# Patient Record
Sex: Female | Born: 1965 | Race: White | Hispanic: No | Marital: Married | Smoking: Former smoker
Health system: Southern US, Community
[De-identification: ages and names within clinical notes are randomized; demographics above are authoritative.]

## PROBLEM LIST (undated history)

## (undated) DIAGNOSIS — J449 Chronic obstructive pulmonary disease, unspecified: Secondary | ICD-10-CM

## (undated) DIAGNOSIS — F419 Anxiety disorder, unspecified: Secondary | ICD-10-CM

## (undated) DIAGNOSIS — E785 Hyperlipidemia, unspecified: Secondary | ICD-10-CM

## (undated) DIAGNOSIS — R002 Palpitations: Secondary | ICD-10-CM

## (undated) DIAGNOSIS — J849 Interstitial pulmonary disease, unspecified: Secondary | ICD-10-CM

## (undated) DIAGNOSIS — R112 Nausea with vomiting, unspecified: Secondary | ICD-10-CM

## (undated) DIAGNOSIS — E039 Hypothyroidism, unspecified: Secondary | ICD-10-CM

## (undated) DIAGNOSIS — D219 Benign neoplasm of connective and other soft tissue, unspecified: Secondary | ICD-10-CM

## (undated) DIAGNOSIS — K5900 Constipation, unspecified: Secondary | ICD-10-CM

## (undated) DIAGNOSIS — I493 Ventricular premature depolarization: Secondary | ICD-10-CM

## (undated) DIAGNOSIS — J439 Emphysema, unspecified: Secondary | ICD-10-CM

## (undated) DIAGNOSIS — R519 Headache, unspecified: Secondary | ICD-10-CM

## (undated) HISTORY — PX: TUBAL LIGATION: SHX77

## (undated) HISTORY — PX: WISDOM TOOTH EXTRACTION: SHX21

## (undated) HISTORY — DX: Interstitial pulmonary disease, unspecified: J84.9

## (undated) HISTORY — PX: BLEPHAROPLASTY: SUR158

## (undated) HISTORY — PX: APPENDECTOMY: SHX54

## (undated) HISTORY — DX: Emphysema, unspecified: J43.9

## (undated) HISTORY — PX: AUGMENTATION MAMMAPLASTY: SUR837

## (undated) HISTORY — DX: Ventricular premature depolarization: I49.3

## (undated) HISTORY — PX: BREAST SURGERY: SHX581

## (undated) HISTORY — PX: OTHER SURGICAL HISTORY: SHX169

## (undated) HISTORY — DX: Constipation, unspecified: K59.00

## (undated) HISTORY — DX: Hyperlipidemia, unspecified: E78.5

---

## 2004-01-18 ENCOUNTER — Other Ambulatory Visit: Admission: RE | Admit: 2004-01-18 | Discharge: 2004-01-18 | Payer: Self-pay | Admitting: Obstetrics and Gynecology

## 2004-03-23 ENCOUNTER — Inpatient Hospital Stay (HOSPITAL_COMMUNITY): Admission: AD | Admit: 2004-03-23 | Discharge: 2004-03-24 | Payer: Self-pay | Admitting: Obstetrics and Gynecology

## 2004-07-22 ENCOUNTER — Inpatient Hospital Stay (HOSPITAL_COMMUNITY): Admission: AD | Admit: 2004-07-22 | Discharge: 2004-07-22 | Payer: Self-pay | Admitting: Obstetrics and Gynecology

## 2004-07-23 ENCOUNTER — Inpatient Hospital Stay (HOSPITAL_COMMUNITY): Admission: AD | Admit: 2004-07-23 | Discharge: 2004-07-23 | Payer: Self-pay | Admitting: *Deleted

## 2004-08-23 ENCOUNTER — Inpatient Hospital Stay (HOSPITAL_COMMUNITY): Admission: AD | Admit: 2004-08-23 | Discharge: 2004-08-25 | Payer: Self-pay | Admitting: *Deleted

## 2004-08-24 ENCOUNTER — Encounter (INDEPENDENT_AMBULATORY_CARE_PROVIDER_SITE_OTHER): Payer: Self-pay | Admitting: *Deleted

## 2004-10-27 ENCOUNTER — Emergency Department (HOSPITAL_COMMUNITY): Admission: EM | Admit: 2004-10-27 | Discharge: 2004-10-27 | Payer: Self-pay | Admitting: Family Medicine

## 2005-03-16 ENCOUNTER — Emergency Department (HOSPITAL_COMMUNITY): Admission: EM | Admit: 2005-03-16 | Discharge: 2005-03-16 | Payer: Self-pay | Admitting: Family Medicine

## 2005-06-10 ENCOUNTER — Emergency Department (HOSPITAL_COMMUNITY): Admission: EM | Admit: 2005-06-10 | Discharge: 2005-06-10 | Payer: Self-pay | Admitting: Emergency Medicine

## 2005-08-10 ENCOUNTER — Emergency Department (HOSPITAL_COMMUNITY): Admission: AD | Admit: 2005-08-10 | Discharge: 2005-08-10 | Payer: Self-pay | Admitting: Family Medicine

## 2005-11-14 ENCOUNTER — Emergency Department (HOSPITAL_COMMUNITY): Admission: EM | Admit: 2005-11-14 | Discharge: 2005-11-14 | Payer: Self-pay | Admitting: Family Medicine

## 2005-12-15 ENCOUNTER — Ambulatory Visit (HOSPITAL_COMMUNITY): Admission: RE | Admit: 2005-12-15 | Discharge: 2005-12-15 | Payer: Self-pay | Admitting: Obstetrics and Gynecology

## 2007-03-11 ENCOUNTER — Emergency Department (HOSPITAL_COMMUNITY): Admission: EM | Admit: 2007-03-11 | Discharge: 2007-03-11 | Payer: Self-pay | Admitting: Emergency Medicine

## 2007-03-19 ENCOUNTER — Emergency Department (HOSPITAL_COMMUNITY): Admission: EM | Admit: 2007-03-19 | Discharge: 2007-03-19 | Payer: Self-pay | Admitting: Family Medicine

## 2007-03-26 ENCOUNTER — Encounter: Admission: RE | Admit: 2007-03-26 | Discharge: 2007-03-26 | Payer: Self-pay | Admitting: Obstetrics and Gynecology

## 2008-01-16 ENCOUNTER — Emergency Department (HOSPITAL_COMMUNITY): Admission: EM | Admit: 2008-01-16 | Discharge: 2008-01-16 | Payer: Self-pay | Admitting: Physician Assistant

## 2008-04-05 ENCOUNTER — Encounter: Admission: RE | Admit: 2008-04-05 | Discharge: 2008-04-05 | Payer: Self-pay | Admitting: Family Medicine

## 2008-07-25 ENCOUNTER — Ambulatory Visit: Payer: Self-pay | Admitting: Internal Medicine

## 2008-07-25 DIAGNOSIS — R519 Headache, unspecified: Secondary | ICD-10-CM | POA: Insufficient documentation

## 2008-07-25 DIAGNOSIS — R51 Headache: Secondary | ICD-10-CM | POA: Insufficient documentation

## 2008-07-25 DIAGNOSIS — K5900 Constipation, unspecified: Secondary | ICD-10-CM | POA: Insufficient documentation

## 2008-07-25 DIAGNOSIS — E039 Hypothyroidism, unspecified: Secondary | ICD-10-CM | POA: Insufficient documentation

## 2008-07-26 LAB — CONVERTED CEMR LAB
ALT: 16 units/L (ref 0–35)
AST: 19 units/L (ref 0–37)
Albumin: 4 g/dL (ref 3.5–5.2)
Alkaline Phosphatase: 62 units/L (ref 39–117)
BUN: 10 mg/dL (ref 6–23)
Basophils Absolute: 0 10*3/uL (ref 0.0–0.1)
Basophils Relative: 0.4 % (ref 0.0–3.0)
CO2: 30 meq/L (ref 19–32)
Calcium: 9.3 mg/dL (ref 8.4–10.5)
Chloride: 105 meq/L (ref 96–112)
Creatinine, Ser: 0.7 mg/dL (ref 0.4–1.2)
Eosinophils Absolute: 0.1 10*3/uL (ref 0.0–0.7)
Eosinophils Relative: 0.9 % (ref 0.0–5.0)
GFR calc Af Amer: 117 mL/min
GFR calc non Af Amer: 97 mL/min
Glucose, Bld: 88 mg/dL (ref 70–99)
HCT: 40.5 % (ref 36.0–46.0)
Hemoglobin: 14.4 g/dL (ref 12.0–15.0)
Lymphocytes Relative: 37.9 % (ref 12.0–46.0)
MCHC: 35.5 g/dL (ref 30.0–36.0)
MCV: 95.5 fL (ref 78.0–100.0)
Monocytes Absolute: 0.5 10*3/uL (ref 0.1–1.0)
Monocytes Relative: 5.4 % (ref 3.0–12.0)
Neutro Abs: 4.7 10*3/uL (ref 1.4–7.7)
Neutrophils Relative %: 55.4 % (ref 43.0–77.0)
Platelets: 192 10*3/uL (ref 150–400)
Potassium: 4.3 meq/L (ref 3.5–5.1)
RBC: 4.24 M/uL (ref 3.87–5.11)
RDW: 12.2 % (ref 11.5–14.6)
Sodium: 142 meq/L (ref 135–145)
TSH: 1.68 microintl units/mL (ref 0.35–5.50)
Total Bilirubin: 0.9 mg/dL (ref 0.3–1.2)
Total Protein: 6.6 g/dL (ref 6.0–8.3)
WBC: 8.6 10*3/uL (ref 4.5–10.5)

## 2010-07-10 NOTE — Assessment & Plan Note (Signed)
Summary: constipation.Marland Kitchenem   History of Present Illness Visit Type: new patient Primary GI MD: Stan Head MD [facility] Primary Provider: Ocie Cornfield, MD Chief Complaint: constipation History of Present Illness:   45 yo white woman with chronic constipation issues. Lately things have worsened with less frequent bowel movements and intervening bloating, abdominal distention and discomfort. These symptoms improve after defecation. Over the years has tried Miralax once daily and other laxatives. Only benefit is with several herbal laxatives at a time but not great. Has not had TSH checked in > than 1 year. Says weight up 10 # in 1 month. No bleeding. Some anorexia. All other GI ROS negative.            Updated Prior Medication List: SYNTHROID 100 MCG TABS (LEVOTHYROXINE SODIUM) 1 tablet by mouth once daily  Current Allergies (reviewed today): No known allergies  Past Medical History:    Hypothyroidism  Past Surgical History:    Reviewed history and no changes required:       Unremarkable   Family History:    Reviewed history and no changes required:       No FH of Colon Cancer:       Patient adopted        Found out Biological father had bile duct cancer died age 42.       Mother-breast cancer  Social History:    Patient currently smokes.     Alcohol Use - no    Daily Caffeine Use  3 cups per day    Occupation: Librarian, academic    Married, 1 son, 2 daughters  Risk Factors:  Tobacco use:  current Alcohol use:  no  Review of Systems       The patient complains of fatigue and fever.         All other ROS negative except as per HPI.   Vital Signs:  Patient Profile:   45 Years Old Female Height:     65 inches Weight:      134.50 pounds BMI:     22.46 BSA:     1.67 Pulse rate:   68 / minute Pulse rhythm:   regular BP sitting:   110 / 70  (left arm)  Vitals Entered By: Milford Cage CMA (July 25, 2008 11:30 AM)                  Physical  Exam  General:     Well developed, well nourished, no acute distress. Head:     Normocephalic and atraumatic. Eyes:     PERRLA, no icterus. Mouth:     No deformity or lesions, dentition normal. Neck:     Supple; no masses or thyromegaly. Lungs:     Clear throughout to auscultation. Heart:     Regular rate and rhythm; no murmurs, rubs,  or bruits. Abdomen:     Soft, nontender and nondistended. No masses, hepatosplenomegaly or hernias noted. Normal bowel sounds. Extremities:     No clubbing, cyanosis, edema or deformities noted. Neurologic:     Alert and  oriented x4;  grossly normal neurologically. Cervical Nodes:     No significant cervical or supraclavicular adenopathy.  Psych:     Alert and cooperative. Normal mood and affect.   Impression & Recommendations:  Problem # 1:  CONSTIPATION (ICD-564.00) Assessment: Deteriorated Sounds like chronic functional issue. ? if needs higher dose of synthroid. May benefit from Amitiza, ? align. Orders: TLB-CBC Platelet - w/Differential (85025-CBCD) TLB-CMP (Comprehensive Metabolic Pnl) (  80053-COMP) TLB-TSH (Thyroid Stimulating Hormone) (84443-TSH)   Problem # 2:  WORSENING CONSTIPATION/CHANGE (ICD-787.99) Assessment: New Await labs. May need colonoscopy to evaluate for structural cause.  Problem # 3:  HYPOTHYROIDISM (ICD-244.9) Assessment: Comment Only  Orders: TLB-CBC Platelet - w/Differential (85025-CBCD) TLB-CMP (Comprehensive Metabolic Pnl) (80053-COMP) TLB-TSH (Thyroid Stimulating Hormone) (84443-TSH)   Problem # 4:  HEADACHE (ICD-784.0) Assessment: Comment Only Needs to get help and get off BC powders. See instructions.   Patient Instructions: 1)  Please go to the basement to have your lab tests drawn today. 2)  Take 4 dulcolax pills and 1 hour later dring 6 glasses of MiraLax over 2 hours to empty your and slowly start reducing bowels. 3)  We will call test results and further plans within a few days. 4)  You  should see a primary care MD about your headaches (internal medicine/family medicine MD). We can make a recommendation if you'd like. 5)  Also start reducing BC's by 1 a day over the next couple of weeks.

## 2010-10-25 NOTE — Op Note (Signed)
Nichole Manning, Nichole Manning NO.:  [id number]   MEDICAL RECORD NO.:  [id number]          PATIENT TYPE:  INP   LOCATION:  9139                          FACILITY:  [facility]   PHYSICIAN:  Lenoard Aden, M.D.DATE OF BIRTH:  1966-01-06   DATE OF PROCEDURE:  08/24/2004  DATE OF DISCHARGE:                                 OPERATIVE REPORT   INDICATION FOR OPERATIVE DELIVERY:  Nonreassuring fetal heart rate tracing.   DESCRIPTION OF PROCEDURE:  After being apprised of the risks, benefits of  vacuum assistance to include small instances of cephalohematoma, scalp  laceration, intracranial hemorrhage, the Kiwi cup is placed at the fetal  vertex LOA, less than 45 degrees, +3 station for two pulls and assisted  delivery of a full-term living female over a second-degree laceration.  Apgars  8 and 9.  Cord pH 7.18.  cord blood collected, placenta delivered  spontaneously, intact, and sent to pathology.  No cervical lacerations are  noted.  Midline second-degree laceration repaired with a 3-0 Rapide in  standard fashion.  Good hemostasis noted.  No other cervical or vaginal  laceration are noted.  Bulb suction performed on the fetus upon delivery at  the fetal vertex as noted.  There is a mild shoulder dystocia encountered  during delivery of the fetus.  This was relieved using Smith-McRoberts  maneuver and suprapubic pressure without difficulty.  Total time of dystocia  less than 15 seconds.  Mother and baby tolerate the procedure well and both  recovering in good condition.      RJT/MEDQ  D:  08/24/2004  T:  08/24/2004  Job:  [id number]

## 2010-10-25 NOTE — Op Note (Signed)
NAMECRYSTEL, Nichole Manning NO.:  [id number]   MEDICAL RECORD NO.:  [id number]          PATIENT TYPE:  AMB   LOCATION:  SDC                           FACILITY:  [facility]   PHYSICIAN:  Lenoard Aden, M.D.DATE OF BIRTH:  February 19, 1966   DATE OF PROCEDURE:  12/15/2005  DATE OF DISCHARGE:                                 OPERATIVE REPORT   PREOPERATIVE DIAGNOSIS:  Desire for elective sterilization.   POSTOPERATIVE DIAGNOSIS:  Desire for elective sterilization.   PROCEDURE:  Laparoscopic tubal ligation.   SURGEON:  Lenoard Aden, M.D.   ANESTHESIA:  General.   ESTIMATED BLOOD LOSS:  Less than 3 mL.   COMPLICATIONS:  None.   DRAINS:  None.   COUNTS:  Correct.   Patient to recovery in good condition.   DESCRIPTION OF PROCEDURE:  After being apprised of the risks of anesthesia,  infection, bleeding, injury to abdominal organs and need for repair, delayed  versus immediate complications to include bowel and bladder injury noted,  the patient was brought to the operating room where she was administered a  general anesthetic without complications. She was prepped and draped in the  usual sterile fashion, catheterized until the bladder was empty. After  achieving adequate anesthesia, a Hulka tenaculum placed per vagina. An  infraumbilical incision made with the scalpel, a Veress needle placed,  opening pressure of -2 noted. Four liters of CO2 intubated without  difficulty, trocar placed, atraumatic trocar entry is visualized. At the  time of placement, pictures are taken. Normal uterus, normal anterior and  posterior cul-de-sac, normal tubes bilaterally and bilateral normal ovaries.  Normal appendix, normal liver and gallbladder bed noted. At this time,  attention is turned to the right tube which is traced out to the fimbriated  end. Three contiguous portions of the ampullary isthmic portion of the tube  are cauterized using bipolar cautery with the Kleppinger bipolar  cautery  down to a resistance of 0. The same procedure was done on the right tube as  done on the left tube. Both tubes were then visualized and divided using  scissors. The tubal lumens were visualized, good hemostasis noted, CO2 was  released and revisualization of good hemostasis is noted. At this time, all  instruments  are removed under direct visualization. Infraumbilical incision is closed  using a #0 Vicryl in an interrupted fashion and skin is closed using  Dermabond. Instruments removed from the vagina. The patient tolerated the  procedure well and she was transferred to recovery in good condition.      Lenoard Aden, M.D.  Electronically Signed     RJT/MEDQ  D:  12/15/2005  T:  12/15/2005  Job:  [id number]

## 2013-06-14 ENCOUNTER — Other Ambulatory Visit: Payer: Self-pay | Admitting: Obstetrics and Gynecology

## 2013-06-20 ENCOUNTER — Encounter (HOSPITAL_COMMUNITY): Payer: Self-pay | Admitting: Pharmacist

## 2013-06-22 NOTE — Patient Instructions (Addendum)
   Your procedure is scheduled on: Friday, Jan 23  Enter through the [employer] [facility] at: [place] up the phone at the desk and dial [phone] and inform us of your arrival.  Please call this number if you have any problems the morning of surgery: [phone]  Remember: Do not eat food after midnight: Thursday Do not drink clear liquids after: 9 AM Friday, day of surgery Take these medicines the morning of surgery with a SIP OF WATER:  Synthroid and xanax if needed  Do not wear jewelry, make-up, or FINGER nail polish No metal in your hair or on your body. Do not wear lotions, powders, perfumes.  You may wear deodorant.  Do not bring valuables to the hospital. Contacts, dentures or bridgework may not be worn into surgery.  Patients discharged on the day of surgery will not be allowed to drive home. For patients being admitted to the hospital, checkout time is 11:00am the day of discharge.  Home with husband Nichole Manning cell  [phone]

## 2013-06-23 ENCOUNTER — Encounter (INDEPENDENT_AMBULATORY_CARE_PROVIDER_SITE_OTHER): Payer: Self-pay

## 2013-06-23 ENCOUNTER — Inpatient Hospital Stay (HOSPITAL_COMMUNITY): Admission: RE | Admit: 2013-06-23 | Payer: Self-pay | Source: Ambulatory Visit

## 2013-06-23 ENCOUNTER — Encounter (HOSPITAL_COMMUNITY): Payer: Self-pay

## 2013-06-23 ENCOUNTER — Encounter (HOSPITAL_COMMUNITY)
Admission: RE | Admit: 2013-06-23 | Discharge: 2013-06-23 | Disposition: A | Discharge status: Home / Self Care | Payer: BC Managed Care – PPO | Source: Ambulatory Visit | Attending: Obstetrics and Gynecology | Admitting: Obstetrics and Gynecology

## 2013-06-23 DIAGNOSIS — Z01818 Encounter for other preprocedural examination: Secondary | ICD-10-CM | POA: Insufficient documentation

## 2013-06-23 DIAGNOSIS — Z01812 Encounter for preprocedural laboratory examination: Secondary | ICD-10-CM | POA: Insufficient documentation

## 2013-06-23 HISTORY — DX: Hypothyroidism, unspecified: E03.9

## 2013-06-23 HISTORY — DX: Anxiety disorder, unspecified: F41.9

## 2013-06-23 HISTORY — DX: Benign neoplasm of connective and other soft tissue, unspecified: D21.9

## 2013-06-23 LAB — COMPREHENSIVE METABOLIC PANEL
ALT: 15 U/L (ref 0–35)
AST: 15 U/L (ref 0–37)
Albumin: 3.5 g/dL (ref 3.5–5.2)
Alkaline Phosphatase: 64 U/L (ref 39–117)
BUN: 10 mg/dL (ref 6–23)
CO2: 24 mEq/L (ref 19–32)
Calcium: 8.8 mg/dL (ref 8.4–10.5)
Chloride: 104 mEq/L (ref 96–112)
Creatinine, Ser: 0.85 mg/dL (ref 0.50–1.10)
GFR calc Af Amer: >90 mL/min (ref >90)
GFR calc non Af Amer: 80 mL/min — ABNORMAL LOW (ref >90)
Glucose, Bld: 149 mg/dL — ABNORMAL HIGH (ref 70–99)
Potassium: 4.1 mEq/L (ref 3.7–5.3)
Sodium: 140 mEq/L (ref 137–147)
Total Bilirubin: <0.2 mg/dL — ABNORMAL LOW (ref 0.3–1.2)
Total Protein: 6.1 g/dL (ref 6.0–8.3)

## 2013-06-23 LAB — CBC
HCT: 41.2 % (ref 36.0–46.0)
Hemoglobin: 14.5 g/dL (ref 12.0–15.0)
MCH: 33.7 pg (ref 26.0–34.0)
MCHC: 35.2 g/dL (ref 30.0–36.0)
MCV: 95.8 fL (ref 78.0–100.0)
Platelets: 267 10*3/uL (ref 150–400)
RBC: 4.3 MIL/uL (ref 3.87–5.11)
RDW: 13 % (ref 11.5–15.5)
WBC: 7.8 10*3/uL (ref 4.0–10.5)

## 2013-06-30 NOTE — H&P (Signed)
NAME:  Nichole Manning, RANA NO.:  MEDICAL RECORD NO.:  [id number]  LOCATION:                                 FACILITY:  PHYSICIAN:  Lovenia Kim, M.D.DATE OF BIRTH:  08/13/1965  DATE OF ADMISSION: DATE OF DISCHARGE:                             HISTORY & PHYSICAL   CHIEF COMPLAINT:  Menometrorrhagia with submucous fibroid for definitive therapy.  HISTORY OF PRESENT ILLNESS:  A 48 year old white female, G3, P3, who presents with aforementioned indication for definitive therapy in the form of a laparoscopic hysterectomy with bilateral salpingectomy.  She desires ovarian conservation.  ALLERGIES:  CODEINE, SULFA.  SOCIAL HISTORY:  She is a current social smoker and smokes approximately 1 pack of cigarettes every other day.  She denies domestic or physical violence.  PAST SURGICAL HISTORY:  She has surgical history remarkable for tubal ligation.  MEDICATIONS:  Interval progestin therapy, Xanax as needed, thyroid replacement therapy, and multivitamin.  PHYSICAL EXAMINATION:  GENERAL:  She is a well-developed, well- nourished, white female, in no acute distress. VITAL SIGNS:  Height of 64 inches, weight of 140 pounds. HEENT:  Normal. NECK:  Supple.  Full range of motion. LUNGS:  Clear. HEART:  Regular rate and rhythm. ABDOMEN:  Soft, nontender. PELVIC:  Reveals the uterus to be slightly enlarged, anteflexed.  No adnexal masses appreciated. EXTREMITIES:  There are no cords. NEUROLOGIC:  Nonfocal. SKIN:  Intact.  IMPRESSION:  Refractory menometrorrhagia with a large submucous fibroid, likely not amenable to hysteroscopic treatment for definitive therapy. The patient desires ovarian conservation.  PLAN:  Proceed with da Vinci total laparoscopic hysterectomy, bilateral salpingectomy.  The risks of anesthesia, infection, bleeding, injury to surrounding organs with possible need for repair is discussed.  Delayed versus immediate complications to  include bowel and bladder injury are noted.  The patient acknowledges and wishes to proceed.     Lovenia Kim, M.D.     RJT/MEDQ  D:  06/30/2013  T:  06/30/2013  Job:  [id number]

## 2013-07-01 ENCOUNTER — Encounter (HOSPITAL_COMMUNITY): Payer: BC Managed Care – PPO | Admitting: Anesthesiology

## 2013-07-01 ENCOUNTER — Ambulatory Visit (HOSPITAL_COMMUNITY)
Admission: RE | Admit: 2013-07-01 | Discharge: 2013-07-02 | Disposition: A | Discharge status: Home / Self Care | Payer: BC Managed Care – PPO | Source: Ambulatory Visit | Attending: Obstetrics and Gynecology | Admitting: Obstetrics and Gynecology

## 2013-07-01 ENCOUNTER — Encounter (HOSPITAL_COMMUNITY)
Admission: RE | Disposition: A | Discharge status: Home / Self Care | Payer: Self-pay | Source: Ambulatory Visit | Attending: Obstetrics and Gynecology

## 2013-07-01 ENCOUNTER — Ambulatory Visit (HOSPITAL_COMMUNITY): Payer: BC Managed Care – PPO | Admitting: Anesthesiology

## 2013-07-01 ENCOUNTER — Encounter (HOSPITAL_COMMUNITY): Payer: Self-pay | Admitting: *Deleted

## 2013-07-01 DIAGNOSIS — F17200 Nicotine dependence, unspecified, uncomplicated: Secondary | ICD-10-CM | POA: Insufficient documentation

## 2013-07-01 DIAGNOSIS — N815 Vaginal enterocele: Secondary | ICD-10-CM | POA: Insufficient documentation

## 2013-07-01 DIAGNOSIS — N920 Excessive and frequent menstruation with regular cycle: Secondary | ICD-10-CM | POA: Insufficient documentation

## 2013-07-01 DIAGNOSIS — D250 Submucous leiomyoma of uterus: Secondary | ICD-10-CM | POA: Insufficient documentation

## 2013-07-01 DIAGNOSIS — D219 Benign neoplasm of connective and other soft tissue, unspecified: Secondary | ICD-10-CM | POA: Diagnosis present

## 2013-07-01 HISTORY — PX: BILATERAL SALPINGECTOMY: SHX5743

## 2013-07-01 LAB — HCG, SERUM, QUALITATIVE: Preg, Serum: NEGATIVE

## 2013-07-01 SURGERY — ROBOTIC SINGLE SITE TOTAL HYSTERECTOMY
Anesthesia: General | Site: Abdomen

## 2013-07-01 MED ORDER — SODIUM CHLORIDE 0.9 % IJ SOLN: 9.0000 mL | INTRAMUSCULAR | Status: DC | PRN

## 2013-07-01 MED ORDER — GLYCOPYRROLATE 0.2 MG/ML IJ SOLN
INTRAMUSCULAR | Status: AC
  Filled 2013-07-01: qty 2

## 2013-07-01 MED ORDER — ROPIVACAINE HCL 5 MG/ML IJ SOLN
INTRAMUSCULAR | Status: AC
  Filled 2013-07-01: qty 60

## 2013-07-01 MED ORDER — HYDROMORPHONE HCL PF 1 MG/ML IJ SOLN
INTRAMUSCULAR | Status: AC
  Administered 2013-07-01: 0.5 mg via INTRAVENOUS
  Filled 2013-07-01: qty 1

## 2013-07-01 MED ORDER — NEOSTIGMINE METHYLSULFATE 1 MG/ML IJ SOLN
INTRAMUSCULAR | Status: AC
  Filled 2013-07-01: qty 1

## 2013-07-01 MED ORDER — FENTANYL CITRATE 0.05 MG/ML IJ SOLN
INTRAMUSCULAR | Status: AC
  Filled 2013-07-01: qty 2

## 2013-07-01 MED ORDER — ROCURONIUM BROMIDE 100 MG/10ML IV SOLN
INTRAVENOUS | Status: AC
  Filled 2013-07-01: qty 1

## 2013-07-01 MED ORDER — ONDANSETRON HCL 4 MG/2ML IJ SOLN
INTRAMUSCULAR | Status: DC | PRN
  Administered 2013-07-01: 4 mg via INTRAVENOUS

## 2013-07-01 MED ORDER — DIPHENHYDRAMINE HCL 50 MG/ML IJ SOLN: 12.5000 mg | Freq: Four times a day (QID) | INTRAMUSCULAR | Status: DC | PRN

## 2013-07-01 MED ORDER — MIDAZOLAM HCL 2 MG/2ML IJ SOLN
INTRAMUSCULAR | Status: DC | PRN
  Administered 2013-07-01: 2 mg via INTRAVENOUS

## 2013-07-01 MED ORDER — GLYCOPYRROLATE 0.2 MG/ML IJ SOLN
INTRAMUSCULAR | Status: DC | PRN
  Administered 2013-07-01: 0.4 mg via INTRAVENOUS

## 2013-07-01 MED ORDER — ROCURONIUM BROMIDE 100 MG/10ML IV SOLN
INTRAVENOUS | Status: DC | PRN
  Administered 2013-07-01: 10 mg via INTRAVENOUS
  Administered 2013-07-01: 40 mg via INTRAVENOUS
  Administered 2013-07-01: 10 mg via INTRAVENOUS

## 2013-07-01 MED ORDER — MIDAZOLAM HCL 2 MG/2ML IJ SOLN
INTRAMUSCULAR | Status: AC
  Filled 2013-07-01: qty 2

## 2013-07-01 MED ORDER — BUPIVACAINE HCL (PF) 0.5 % IJ SOLN
INTRAMUSCULAR | Status: AC
  Filled 2013-07-01: qty 30

## 2013-07-01 MED ORDER — SODIUM CHLORIDE 0.9 % IJ SOLN
INTRAMUSCULAR | Status: DC | PRN
  Administered 2013-07-01: 60 mL via INTRAVENOUS

## 2013-07-01 MED ORDER — SODIUM CHLORIDE 0.9 % IJ SOLN
INTRAMUSCULAR | Status: AC
  Filled 2013-07-01: qty 10

## 2013-07-01 MED ORDER — ROPIVACAINE HCL 5 MG/ML IJ SOLN
INTRAMUSCULAR | Status: DC | PRN
  Administered 2013-07-01: 60 mL

## 2013-07-01 MED ORDER — FENTANYL CITRATE 0.05 MG/ML IJ SOLN
INTRAMUSCULAR | Status: AC
  Filled 2013-07-01: qty 5

## 2013-07-01 MED ORDER — ZOLPIDEM TARTRATE 5 MG PO TABS: 5.0000 mg | ORAL_TABLET | Freq: Every evening | ORAL | Status: DC | PRN

## 2013-07-01 MED ORDER — SCOPOLAMINE 1 MG/3DAYS TD PT72
1.0000 | MEDICATED_PATCH | TRANSDERMAL | Status: DC
  Administered 2013-07-01: 1.5 mg via TRANSDERMAL

## 2013-07-01 MED ORDER — DEXTROSE IN LACTATED RINGERS 5 % IV SOLN
INTRAVENOUS | Status: DC
  Administered 2013-07-02: 02:00:00 via INTRAVENOUS

## 2013-07-01 MED ORDER — LACTATED RINGERS IR SOLN
Status: DC | PRN
  Administered 2013-07-01: 3000 mL

## 2013-07-01 MED ORDER — HYDROMORPHONE HCL PF 1 MG/ML IJ SOLN
INTRAMUSCULAR | Status: AC
Start: 2013-07-01 — End: 2013-07-01
  Administered 2013-07-01: 0.5 mg via INTRAVENOUS
  Filled 2013-07-01: qty 1

## 2013-07-01 MED ORDER — FENTANYL CITRATE 0.05 MG/ML IJ SOLN
INTRAMUSCULAR | Status: DC | PRN
  Administered 2013-07-01 (×3): 50 ug via INTRAVENOUS
  Administered 2013-07-01 (×2): 100 ug via INTRAVENOUS

## 2013-07-01 MED ORDER — OXYCODONE-ACETAMINOPHEN 5-325 MG PO TABS
1.0000 | ORAL_TABLET | ORAL | Status: DC | PRN
  Administered 2013-07-02: 1 via ORAL
  Administered 2013-07-02: 2 via ORAL
  Administered 2013-07-02: 1 via ORAL
  Administered 2013-07-02: 2 via ORAL
  Filled 2013-07-01: qty 2
  Filled 2013-07-01 (×2): qty 1
  Filled 2013-07-01: qty 2

## 2013-07-01 MED ORDER — CEFAZOLIN SODIUM-DEXTROSE 2-3 GM-% IV SOLR
INTRAVENOUS | Status: AC
  Filled 2013-07-01: qty 50

## 2013-07-01 MED ORDER — HYDROMORPHONE HCL PF 1 MG/ML IJ SOLN
0.2500 mg | INTRAMUSCULAR | Status: DC | PRN
  Administered 2013-07-01 (×4): 0.5 mg via INTRAVENOUS

## 2013-07-01 MED ORDER — BUPIVACAINE HCL 0.5 % IJ SOLN
INTRAMUSCULAR | Status: DC | PRN
  Administered 2013-07-01: 10 mL

## 2013-07-01 MED ORDER — HYDROMORPHONE HCL PF 1 MG/ML IJ SOLN
INTRAMUSCULAR | Status: DC | PRN
  Administered 2013-07-01 (×2): 0.5 mg via INTRAVENOUS

## 2013-07-01 MED ORDER — SODIUM CHLORIDE 0.9 % IJ SOLN
INTRAMUSCULAR | Status: AC
  Filled 2013-07-01: qty 50

## 2013-07-01 MED ORDER — DEXAMETHASONE SODIUM PHOSPHATE 10 MG/ML IJ SOLN
INTRAMUSCULAR | Status: DC | PRN
  Administered 2013-07-01: 10 mg via INTRAVENOUS

## 2013-07-01 MED ORDER — PROPOFOL 10 MG/ML IV BOLUS
INTRAVENOUS | Status: DC | PRN
  Administered 2013-07-01: 180 mg via INTRAVENOUS

## 2013-07-01 MED ORDER — NALOXONE HCL 0.4 MG/ML IJ SOLN: 0.4000 mg | INTRAMUSCULAR | Status: DC | PRN

## 2013-07-01 MED ORDER — SCOPOLAMINE 1 MG/3DAYS TD PT72
MEDICATED_PATCH | TRANSDERMAL | Status: AC
  Administered 2013-07-01: 1.5 mg via TRANSDERMAL
  Filled 2013-07-01: qty 1

## 2013-07-01 MED ORDER — LEVOTHYROXINE SODIUM 150 MCG PO TABS
150.0000 ug | ORAL_TABLET | Freq: Every day | ORAL | Status: DC
  Administered 2013-07-02: 150 ug via ORAL
  Filled 2013-07-01: qty 1

## 2013-07-01 MED ORDER — DEXAMETHASONE SODIUM PHOSPHATE 10 MG/ML IJ SOLN
INTRAMUSCULAR | Status: AC
  Filled 2013-07-01: qty 1

## 2013-07-01 MED ORDER — LACTATED RINGERS IV SOLN
INTRAVENOUS | Status: DC
  Administered 2013-07-01 (×3): via INTRAVENOUS

## 2013-07-01 MED ORDER — TRAMADOL HCL 50 MG PO TABS
50.0000 mg | ORAL_TABLET | Freq: Four times a day (QID) | ORAL | Status: DC | PRN
  Administered 2013-07-02: 50 mg via ORAL
  Filled 2013-07-01: qty 1

## 2013-07-01 MED ORDER — HYDROMORPHONE 0.3 MG/ML IV SOLN
INTRAVENOUS | Status: DC
  Administered 2013-07-01: 0.3 mL via INTRAVENOUS
  Administered 2013-07-01: 1.8 mg via INTRAVENOUS
  Administered 2013-07-02: 0.9 mg via INTRAVENOUS
  Filled 2013-07-01: qty 25

## 2013-07-01 MED ORDER — PNEUMOCOCCAL VAC POLYVALENT 25 MCG/0.5ML IJ INJ
0.5000 mL | INJECTION | INTRAMUSCULAR | Status: AC
  Administered 2013-07-02: 0.5 mL via INTRAMUSCULAR
  Filled 2013-07-01 (×2): qty 0.5

## 2013-07-01 MED ORDER — ONDANSETRON HCL 4 MG/2ML IJ SOLN: 4.0000 mg | Freq: Four times a day (QID) | INTRAMUSCULAR | Status: DC | PRN

## 2013-07-01 MED ORDER — ARTIFICIAL TEARS OP OINT
TOPICAL_OINTMENT | OPHTHALMIC | Status: AC
  Filled 2013-07-01: qty 3.5

## 2013-07-01 MED ORDER — INFLUENZA VAC SPLIT QUAD 0.5 ML IM SUSP
0.5000 mL | INTRAMUSCULAR | Status: AC
  Administered 2013-07-02: 0.5 mL via INTRAMUSCULAR
  Filled 2013-07-01: qty 0.5

## 2013-07-01 MED ORDER — NEOSTIGMINE METHYLSULFATE 1 MG/ML IJ SOLN
INTRAMUSCULAR | Status: DC | PRN
  Administered 2013-07-01: 2 mg via INTRAVENOUS

## 2013-07-01 MED ORDER — DIPHENHYDRAMINE HCL 12.5 MG/5ML PO ELIX: 12.5000 mg | ORAL_SOLUTION | Freq: Four times a day (QID) | ORAL | Status: DC | PRN

## 2013-07-01 MED ORDER — PROPOFOL 10 MG/ML IV EMUL
INTRAVENOUS | Status: AC
  Filled 2013-07-01: qty 20

## 2013-07-01 MED ORDER — ONDANSETRON HCL 4 MG/2ML IJ SOLN
INTRAMUSCULAR | Status: AC
  Filled 2013-07-01: qty 2

## 2013-07-01 MED ORDER — CEFAZOLIN SODIUM-DEXTROSE 2-3 GM-% IV SOLR
2.0000 g | INTRAVENOUS | Status: AC
Start: 2013-07-01 — End: 2013-07-01
  Administered 2013-07-01: 2 g via INTRAVENOUS

## 2013-07-01 MED ORDER — HYDROMORPHONE HCL PF 1 MG/ML IJ SOLN
INTRAMUSCULAR | Status: AC
  Filled 2013-07-01: qty 1

## 2013-07-01 MED ORDER — LIDOCAINE HCL (CARDIAC) 20 MG/ML IV SOLN
INTRAVENOUS | Status: AC
  Filled 2013-07-01: qty 5

## 2013-07-01 MED ORDER — LIDOCAINE HCL (CARDIAC) 20 MG/ML IV SOLN
INTRAVENOUS | Status: DC | PRN
  Administered 2013-07-01: 50 mg via INTRAVENOUS

## 2013-07-01 SURGICAL SUPPLY — 88 items
ADH SKN CLS APL DERMABOND .7 (GAUZE/BANDAGES/DRESSINGS) ×2
BAG URINE DRAINAGE (UROLOGICAL SUPPLIES) ×3 IMPLANT
BARRIER ADHS 3X4 INTERCEED (GAUZE/BANDAGES/DRESSINGS) ×3 IMPLANT
BRR ADH 4X3 ABS CNTRL BYND (GAUZE/BANDAGES/DRESSINGS) ×2
CANNULA SEALS 8.5MM (CANNULA) ×1
CATH FOLEY 3WAY  5CC 16FR (CATHETERS) ×1
CATH FOLEY 3WAY 5CC 16FR (CATHETERS) ×2 IMPLANT
CHLORAPREP W/TINT 26ML (MISCELLANEOUS) ×3 IMPLANT
CLOTH BEACON ORANGE TIMEOUT ST (SAFETY) ×3 IMPLANT
CONT PATH 16OZ SNAP LID 3702 (MISCELLANEOUS) ×3 IMPLANT
CORD ACTIVE DISPOSABLE (ELECTRODE) ×1
CORD ELECTRO ACTIVE DISP (ELECTRODE) IMPLANT
COVER MAYO STAND STRL (DRAPES) ×3 IMPLANT
COVER TABLE BACK 60X90 (DRAPES) ×6 IMPLANT
COVER TIP SHEARS 8 DVNC (MISCELLANEOUS) ×2 IMPLANT
COVER TIP SHEARS 8MM DA VINCI (MISCELLANEOUS) ×1
DECANTER SPIKE VIAL GLASS SM (MISCELLANEOUS) ×3 IMPLANT
DERMABOND ADVANCED (GAUZE/BANDAGES/DRESSINGS) ×1
DERMABOND ADVANCED .7 DNX12 (GAUZE/BANDAGES/DRESSINGS) ×2 IMPLANT
DRAPE HUG U DISPOSABLE (DRAPE) ×3 IMPLANT
DRAPE LG THREE QUARTER DISP (DRAPES) ×6 IMPLANT
DRAPE WARM FLUID 44X44 (DRAPE) ×3 IMPLANT
DRSG TEGADERM 4X4.75 (GAUZE/BANDAGES/DRESSINGS) ×1 IMPLANT
ELECT REM PT RETURN 9FT ADLT (ELECTROSURGICAL) ×3
ELECTRODE REM PT RTRN 9FT ADLT (ELECTROSURGICAL) ×2 IMPLANT
EVACUATOR SMOKE 8.L (FILTER) ×3 IMPLANT
FILTER SMOKE EVAC LAPAROSHD (FILTER) ×1 IMPLANT
FORCEPS BI-POLAR FEN SS 5MM (FORCEP) ×1 IMPLANT
GAUZE SPONGE 4X4 12PLY STRL LF (GAUZE/BANDAGES/DRESSINGS) ×1 IMPLANT
GAUZE VASELINE 3X9 (GAUZE/BANDAGES/DRESSINGS) IMPLANT
GLOVE BIO SURGEON STRL SZ 6.5 (GLOVE) ×1 IMPLANT
GLOVE BIO SURGEON STRL SZ7.5 (GLOVE) ×6 IMPLANT
GLOVE BIOGEL PI IND STRL 7.0 (GLOVE) IMPLANT
GLOVE BIOGEL PI INDICATOR 7.0 (GLOVE) ×2
GLOVE ECLIPSE 6.5 STRL STRAW (GLOVE) ×1 IMPLANT
GOWN STRL REIN XL XLG (GOWN DISPOSABLE) ×18 IMPLANT
GYRUS RUMI II 3.5CM BLUE (DISPOSABLE) ×3
GYRUS RUMI II 4.0CM BLUE (DISPOSABLE)
KIT ACCESSORY DA VINCI DISP (KITS) ×1
KIT ACCESSORY DVNC DISP (KITS) ×2 IMPLANT
LEGGING LITHOTOMY PAIR STRL (DRAPES) ×3 IMPLANT
NDL HYPO 25X1 1.5 SAFETY (NEEDLE) IMPLANT
NDL SPNL 20GX3.5 QUINCKE YW (NEEDLE) IMPLANT
NEEDLE HYPO 25X1 1.5 SAFETY (NEEDLE) ×3 IMPLANT
NEEDLE INSUFFLATION 120MM (ENDOMECHANICALS) ×3 IMPLANT
NEEDLE INSUFFLATION 150MM (ENDOMECHANICALS) IMPLANT
NEEDLE SPNL 20GX3.5 QUINCKE YW (NEEDLE) ×3 IMPLANT
OCCLUDER COLPOPNEUMO (BALLOONS) ×1 IMPLANT
PACK LAVH (CUSTOM PROCEDURE TRAY) ×3 IMPLANT
PAD PREP 24X48 CUFFED NSTRL (MISCELLANEOUS) ×6 IMPLANT
PLUG CATH AND CAP STER (CATHETERS) ×3 IMPLANT
PORT ENDOSCOPE SS 8.5MM (MISCELLANEOUS) ×1 IMPLANT
PROTECTOR NERVE ULNAR (MISCELLANEOUS) ×6 IMPLANT
RETRACTOR WOUND ALXS 19CM XSML (INSTRUMENTS) IMPLANT
RTRCTR WOUND ALEXIS 18CM SML (INSTRUMENTS)
RTRCTR WOUND ALEXIS 19CM XSML (INSTRUMENTS)
RUMI II 3.0CM BLUE KOH-EFFICIE (DISPOSABLE) ×1 IMPLANT
RUMI II GYRUS 3.5CM BLUE (DISPOSABLE) IMPLANT
RUMI II GYRUS 4.0CM BLUE (DISPOSABLE) IMPLANT
SAVER CELL AAL HAEMONETICS (INSTRUMENTS) IMPLANT
SEAL CANN 8.5 DVNC (CANNULA) IMPLANT
SET CYSTO W/LG BORE CLAMP LF (SET/KITS/TRAYS/PACK) IMPLANT
SET IRRIG TUBING LAPAROSCOPIC (IRRIGATION / IRRIGATOR) ×3 IMPLANT
SOLUTION ELECTROLUBE (MISCELLANEOUS) ×3 IMPLANT
SUT MNCRL AB 4-0 PS2 18 (SUTURE) ×2 IMPLANT
SUT MNCRL+ AB 3-0 CT1 36 (SUTURE) IMPLANT
SUT MONOCRYL AB 3-0 CT1 36IN (SUTURE) ×1
SUT VIC AB 0 CT1 27 (SUTURE) ×12
SUT VIC AB 0 CT1 27XBRD ANBCTR (SUTURE) ×4 IMPLANT
SUT VICRYL 0 UR6 27IN ABS (SUTURE) ×4 IMPLANT
SUT VICRYL RAPIDE 4/0 PS 2 (SUTURE) ×6 IMPLANT
SUT VLOC 180 0 9IN  GS21 (SUTURE) ×1
SUT VLOC 180 0 9IN GS21 (SUTURE) IMPLANT
SYR 20CC LL (SYRINGE) ×1 IMPLANT
SYR 50ML LL SCALE MARK (SYRINGE) ×3 IMPLANT
SYR CONTROL 10ML LL (SYRINGE) ×1 IMPLANT
SYRINGE 10CC LL (SYRINGE) ×3 IMPLANT
TIP UTERINE 6.7X10CM GRN DISP (MISCELLANEOUS) ×1 IMPLANT
TIP UTERINE 6.7X8CM BLUE DISP (MISCELLANEOUS) ×1 IMPLANT
TOWEL OR 17X24 6PK STRL BLUE (TOWEL DISPOSABLE) ×9 IMPLANT
TROCAR DISP BLADELESS 8 DVNC (TROCAR) ×2 IMPLANT
TROCAR DISP BLADELESS 8MM (TROCAR) ×1
TROCAR XCEL 12X100 BLDLESS (ENDOMECHANICALS) IMPLANT
TROCAR XCEL NON-BLD 5MMX100MML (ENDOMECHANICALS) ×3 IMPLANT
TROCAR Z-THREAD 12X150 (TROCAR) ×3 IMPLANT
TUBING FILTER THERMOFLATOR (ELECTROSURGICAL) ×3 IMPLANT
WARMER LAPAROSCOPE (MISCELLANEOUS) ×3 IMPLANT
WATER STERILE IRR 1000ML POUR (IV SOLUTION) ×9 IMPLANT

## 2013-07-01 NOTE — Transfer of Care (Signed)
Immediate Anesthesia Transfer of Care Note  Immediate Anesthesia Transfer of Care Note  Patient: Nichole Manning  Procedure(s) Performed: Procedure(s): ROBOTIC SINGLE SITE TOTAL HYSTERECTOMY (N/A) BILATERAL SALPINGECTOMY (Bilateral)  Patient Location: PACU  Anesthesia Type:General  Level of Consciousness: awake  Airway & Oxygen Therapy: Patient Spontanous Breathing  Post-op Assessment: Report given to PACU RN  Post vital signs: stable  Filed Vitals:   07/01/13 1139  BP: 122/81  Pulse: 87  Temp: 36.6 C  Resp: 20    Complications: No apparent anesthesia complications

## 2013-07-01 NOTE — Anesthesia Postprocedure Evaluation (Signed)
  Anesthesia Post-op Note  Patient: Nichole Manning  Procedure(s) Performed: Procedure(s): ROBOTIC SINGLE SITE TOTAL HYSTERECTOMY (N/A) BILATERAL SALPINGECTOMY (Bilateral)  Patient Location: PACU  Anesthesia Type:General  Level of Consciousness: awake, alert  and oriented  Airway and Oxygen Therapy: Patient Spontanous Breathing  Post-op Pain: mild  Post-op Assessment: Post-op Vital signs reviewed, Patient's Cardiovascular Status Stable, Respiratory Function Stable, Patent Airway, No signs of Nausea or vomiting and Pain level controlled  Post-op Vital Signs: Reviewed and stable  Complications: No apparent anesthesia complications

## 2013-07-01 NOTE — Anesthesia Preprocedure Evaluation (Signed)
Anesthesia Evaluation  Patient identified by MRN, date of birth, ID band Patient awake    Reviewed: Allergy & Precautions, H&P , Patient's Chart, lab work & pertinent test results, reviewed documented beta blocker date and time   Airway Mallampati: II TM Distance: >3 FB Neck ROM: full    Dental no notable dental hx.    Pulmonary Recent URI  (Took Z-pak, feels well, still has slight cough, was greenish when productive, but no longer. No fever), Resolved, Current Smoker,  breath sounds clear to auscultation  Pulmonary exam normal       Cardiovascular Rhythm:regular Rate:Normal     Neuro/Psych    GI/Hepatic   Endo/Other    Renal/GU      Musculoskeletal   Abdominal   Peds  Hematology   Anesthesia Other Findings   Reproductive/Obstetrics                           Anesthesia Physical Anesthesia Plan  ASA: II  Anesthesia Plan: General   Post-op Pain Management:    Induction: Intravenous  Airway Management Planned: Oral ETT  Additional Equipment:   Intra-op Plan:   Post-operative Plan: Extubation in OR  Informed Consent: I have reviewed the patients History and Physical, chart, labs and discussed the procedure including the risks, benefits and alternatives for the proposed anesthesia with the patient or authorized representative who has indicated his/her understanding and acceptance.   Dental Advisory Given and Dental advisory given  Plan Discussed with: CRNA and Surgeon  Anesthesia Plan Comments: (  Discussed resolving URI and general anesthesia, including possible nausea, instrumentation of airway, sore throat,pulmonary aspiration, etc. She is not wheezing; I feel her pulmonary risk is no greater than other smokers.  I asked if the were any outstanding questions, or  concerns before we proceeded. )        Anesthesia Quick Evaluation

## 2013-07-01 NOTE — Op Note (Signed)
07/01/2013  4:06 PM  PATIENT:  Nichole Manning  48 y.o. female  PRE-OPERATIVE DIAGNOSIS:  Fibroids, Menorrhagia  POST-OPERATIVE DIAGNOSIS:  Fibroids, Menorrhagia  PROCEDURE:  Procedure(s): ROBOTIC SINGLE SITE TOTAL HYSTERECTOMY BILATERAL SALPINGECTOMY MCCALL CUL DE PLASTY  SURGEON:  Surgeon(s): Lovenia Kim, MD  ASSISTANTS: Mel Almond, CNM and Drake Leach, CNM   ANESTHESIA:   General  ESTIMATED BLOOD LOSS: 50cc  DRAINS: Urinary Catheter (Foley)   LOCAL MEDICATIONS USED:  MARCAINE    and Amount: 10 ml  SPECIMEN:  Source of Specimen:  uterus, cervix and tubes  DISPOSITION OF SPECIMEN:  PATHOLOGY  COUNTS:  YES  DICTATION #: done  PLAN OF CARE: DC home in am- 23 hr observation  PATIENT DISPOSITION:  PACU - hemodynamically stable.

## 2013-07-01 NOTE — Progress Notes (Signed)
Patient ID: Nichole Manning, female   DOB: [birth year], 48 y.o.   MRN: [id number] Patient seen and examined. Consent witnessed and signed. No changes noted. Update completed. CBC    Component Value Date/Time   WBC 7.8 06/23/2013 1318   RBC 4.30 06/23/2013 1318   HGB 14.5 06/23/2013 1318   HCT 41.2 06/23/2013 1318   PLT 267 06/23/2013 1318   MCV 95.8 06/23/2013 1318   MCH 33.7 06/23/2013 1318   MCHC 35.2 06/23/2013 1318   RDW 13.0 06/23/2013 1318   MONOABS 0.5 07/25/2008 1212   EOSABS 0.1 07/25/2008 1212   BASOSABS 0.0 07/25/2008 1212

## 2013-07-02 LAB — CBC
HCT: 40.9 % (ref 36.0–46.0)
Hemoglobin: 13.7 g/dL (ref 12.0–15.0)
MCH: 32.3 pg (ref 26.0–34.0)
MCHC: 33.5 g/dL (ref 30.0–36.0)
MCV: 96.5 fL (ref 78.0–100.0)
Platelets: 235 10*3/uL (ref 150–400)
RBC: 4.24 MIL/uL (ref 3.87–5.11)
RDW: 13 % (ref 11.5–15.5)
WBC: 13.1 10*3/uL — ABNORMAL HIGH (ref 4.0–10.5)

## 2013-07-02 LAB — BASIC METABOLIC PANEL
BUN: 8 mg/dL (ref 6–23)
CO2: 25 mEq/L (ref 19–32)
Calcium: 8.4 mg/dL (ref 8.4–10.5)
Chloride: 101 mEq/L (ref 96–112)
Creatinine, Ser: 0.73 mg/dL (ref 0.50–1.10)
GFR calc Af Amer: >90 mL/min (ref >90)
GFR calc non Af Amer: >90 mL/min (ref >90)
Glucose, Bld: 119 mg/dL — ABNORMAL HIGH (ref 70–99)
Potassium: 4.9 mEq/L (ref 3.7–5.3)
Sodium: 137 mEq/L (ref 137–147)

## 2013-07-02 MED ORDER — TRAMADOL HCL 50 MG PO TABS: 50.0000 mg | ORAL_TABLET | Freq: Four times a day (QID) | ORAL | Status: DC | PRN

## 2013-07-02 MED ORDER — OXYCODONE-ACETAMINOPHEN 5-325 MG PO TABS: 1.0000 | ORAL_TABLET | ORAL | Status: DC | PRN

## 2013-07-02 MED ORDER — ALPRAZOLAM 0.5 MG PO TABS: 0.5000 mg | ORAL_TABLET | Freq: Every evening | ORAL | Status: DC | PRN

## 2013-07-02 NOTE — Progress Notes (Signed)
Discharge instructions provided to patient at bedside.  Activity, medications, follow up appointments, incision care, when to call the doctor and community resources discussed.  No questions at this time.  Patient left unit in stable condition with all personal belongings and prescriptions accompanied by staff.  Leighton Roach, RN------------

## 2013-07-02 NOTE — Op Note (Signed)
Nichole Manning, Nichole Manning NO.:  [id number]  MEDICAL RECORD NO.:  [id number]  LOCATION:  4008                          FACILITY:  [place]  PHYSICIAN:  Lovenia Kim, M.D.DATE OF BIRTH:  10-01-1965  DATE OF PROCEDURE: DATE OF DISCHARGE:                              OPERATIVE REPORT   PREOPERATIVE DIAGNOSIS:  Menometrorrhagia with large submucous fibroid for definitive therapy.  POSTOPERATIVE DIAGNOSES:  Menometrorrhagia with large submucous fibroid for definitive therapy plus enterocele.  PROCEDURE:  Single site total laparoscopic hysterectomy, bilateral salpingectomy, McCall culdoplasty.  SURGEON:  Lovenia Kim, MD  ASSISTANTS:  Artelia Laroche, CNM, Drake Leach, CNM  ANESTHESIA:  Local and general.  ESTIMATED BLOOD LOSS:  Less than 50 mL.  COMPLICATIONS:  None.  DRAINS:  Foley.  COUNTS:  Correct.  The patient to recovery in good condition.  BRIEF OPERATIVE NOTE:  After being apprised of the risks of anesthesia, infection, bleeding, injury to surrounding organs, possible need for repair, delayed versus immediate complications to include bowel and bladder injury, possible need for repair, the patient was brought to the operating room where she was administered general anesthetic without complications.  Prepped and draped in usual sterile fashion.  Foley catheter placed after achieving adequate anesthesia, dilute Marcaine solution placed.  A RUMI retractor was placed per vagina and intraumbilical incision was made with a scalpel, approximately 1.5 to 3 cm.  The umbilical stalk was transected off the fascia which was then opened for about a 3 cm opening and the peritoneal cavity entered atraumatically.  A gel port is placed.  The abdomen was insufflated to 3 L of CO2 and insufflated without difficulty.  Camera was placed. Visualization revealed slightly enlarged uterus, bilateral normal tubal, previously divided tubes, and bilateral normal appearing  ovaries, normal anterior posterior cul-de-sac, normal appendix.  The single site robotic ports were docked in the standard fashion, and the 250-mm trocars were placed in arm 2, and arm 1.  After achieving deep Trendelenburg position, the robot was docked in standard fashion, and the hook and fenestrated forceps were placed.  The right tube was then divided and passed off the field and retracted through 10-mm accessory port.  The left tube was similarly removed.  The retroperitoneal space was then entered on the right.  The ureter was identified.  The tubo-ovarian ligament on the right side was transected after bipolar cautery.  The round ligament was transected and opened.  The bladder flap was developed sharply.  The uterine vessels were skeletonized on the right, cauterized and divided.  On the left side, the retroperitoneal space was entered.  The ureter was identified.  The tubo-ovarian ligament was cauterized and divided.  The round ligament was opened and the bladder flap was further developed and completely developed circumferentially anteriorly exposing the bulging of the RUMI cup.  The uterine vessels on the left were skeletonized, cauterized and divided.  At this time, the occluder balloon was insufflated and the specimen was detached 360 degrees at the cervicovaginal junction and retracted into the vagina. The cuff was then closed using a V-Loc 0 suture in a continuous running fashion.  The McCall culdoplasty stitch was placed.  Good hemostasis noted.  Irrigation accomplished.  Ropivacaine was placed.  Incision was closed using 0 PDS, 3-0 Monocryl and 4-0 Monocryl.  Pressure dressing placed.  All instruments were removed from the vagina.  Urine was clear and output was excellent.  The patient tolerated the procedure well, was awakened, and transferred to recovery in good condition.     Lovenia Kim, M.D.     RJT/MEDQ  D:  07/01/2013  T:  07/02/2013  Job:  [id number]

## 2013-07-02 NOTE — Progress Notes (Signed)
1 Day Post-Op Procedure(s) (LRB): ROBOTIC SINGLE SITE TOTAL HYSTERECTOMY (N/A) BILATERAL SALPINGECTOMY (Bilateral)  Subjective: Patient reports nausea, vomiting, tolerating PO, + flatus and no problems voiding.    Objective: I have reviewed patient's vital signs, medications and labs. Patient Vitals for the past 24 hrs:  BP Temp Temp src Pulse Resp SpO2 Height Weight  07/02/13 0500 113/65 mmHg 98 F (36.7 C) Oral 79 16 - - -  07/02/13 0126 96/61 mmHg 97.9 F (36.6 C) Oral 72 16 96 % - -  07/02/13 0032 - - - - 17 96 % - -  07/01/13 2153 - - - - 17 99 % - -  07/01/13 2145 103/68 mmHg 97.8 F (36.6 C) Oral 82 17 99 % - -  07/01/13 2059 98/56 mmHg 97.6 F (36.4 C) Oral 77 18 96 % - -  07/01/13 1956 93/66 mmHg 97.3 F (36.3 C) Oral 80 20 96 % 5\' 4"  (1.626 m) 63.957 kg (141 lb)  07/01/13 1857 106/67 mmHg 97.6 F (36.4 C) Oral 75 16 95 % - -  07/01/13 1829 - - - - 15 92 % - -  07/01/13 1800 100/64 mmHg 97.8 F (36.6 C) - 85 16 100 % - -  07/01/13 1745 103/70 mmHg 97.7 F (36.5 C) - 78 18 100 % - -  07/01/13 1730 101/73 mmHg - - 82 17 100 % - -  07/01/13 1715 98/68 mmHg 97.8 F (36.6 C) - 81 19 100 % - -  07/01/13 1700 101/68 mmHg - - 82 15 100 % - -  07/01/13 1645 102/71 mmHg - - 93 16 98 % - -  07/01/13 1630 94/65 mmHg - - 83 21 99 % - -  07/01/13 1620 107/67 mmHg 97.5 F (36.4 C) - 85 18 100 % - -   CBC    Component Value Date/Time   WBC 13.1* 07/02/2013 0505   RBC 4.24 07/02/2013 0505   HGB 13.7 07/02/2013 0505   HCT 40.9 07/02/2013 0505   PLT 235 07/02/2013 0505   MCV 96.5 07/02/2013 0505   MCH 32.3 07/02/2013 0505   MCHC 33.5 07/02/2013 0505   RDW 13.0 07/02/2013 0505   MONOABS 0.5 07/25/2008 1212   EOSABS 0.1 07/25/2008 1212   BASOSABS 0.0 07/25/2008 1212      General: alert, cooperative and appears stated age Resp: clear to auscultation bilaterally and normal percussion bilaterally Cardio: regular rate and rhythm, S1, S2 normal, no murmur, click, rub or gallop and  normal apical impulse GI: soft, non-tender; bowel sounds normal; no masses,  no organomegaly and incision: clean, dry and intact Extremities: extremities normal, atraumatic, no cyanosis or edema and Homans sign is negative, no sign of DVT Vaginal Bleeding: minimal  Assessment: s/p Procedure(s): ROBOTIC SINGLE SITE TOTAL HYSTERECTOMY (N/A) BILATERAL SALPINGECTOMY (Bilateral): stable, progressing well and tolerating diet  Plan: DC home  Rx given Fu office one week No DC summary due to 23 hr observation.  LOS: 1 day    Tearra Ouk J 07/02/2013, 12:22 PM

## 2013-07-02 NOTE — Anesthesia Postprocedure Evaluation (Signed)
  Anesthesia Post-op Note  Anesthesia Post Note  Patient: Nichole Manning  Procedure(s) Performed: Procedure(s) (LRB): ROBOTIC SINGLE SITE TOTAL HYSTERECTOMY (N/A) BILATERAL SALPINGECTOMY (Bilateral)  Anesthesia type: General  Patient location: [facility]  Post pain: Pain level controlled  Post assessment: Post-op Vital signs reviewed  Last Vitals:  Filed Vitals:   07/02/13 0500  BP: 113/65  Pulse: 79  Temp: 36.7 C  Resp: 16    Post vital signs: Reviewed  Level of consciousness: sedated  Complications: No apparent anesthesia complications

## 2013-07-04 ENCOUNTER — Encounter (HOSPITAL_COMMUNITY): Payer: Self-pay | Admitting: Obstetrics and Gynecology

## 2014-10-24 ENCOUNTER — Ambulatory Visit: Payer: Self-pay | Admitting: Primary Care

## 2016-02-20 ENCOUNTER — Encounter (INDEPENDENT_AMBULATORY_CARE_PROVIDER_SITE_OTHER): Payer: Self-pay

## 2016-03-04 DIAGNOSIS — J069 Acute upper respiratory infection, unspecified: Secondary | ICD-10-CM | POA: Diagnosis not present

## 2016-04-28 DIAGNOSIS — F331 Major depressive disorder, recurrent, moderate: Secondary | ICD-10-CM | POA: Diagnosis not present

## 2016-05-12 DIAGNOSIS — F331 Major depressive disorder, recurrent, moderate: Secondary | ICD-10-CM | POA: Diagnosis not present

## 2016-05-19 DIAGNOSIS — F331 Major depressive disorder, recurrent, moderate: Secondary | ICD-10-CM | POA: Diagnosis not present

## 2016-05-26 DIAGNOSIS — F331 Major depressive disorder, recurrent, moderate: Secondary | ICD-10-CM | POA: Diagnosis not present

## 2016-06-16 DIAGNOSIS — Z1231 Encounter for screening mammogram for malignant neoplasm of breast: Secondary | ICD-10-CM | POA: Diagnosis not present

## 2016-06-16 DIAGNOSIS — Z6821 Body mass index (BMI) 21.0-21.9, adult: Secondary | ICD-10-CM | POA: Diagnosis not present

## 2016-06-16 DIAGNOSIS — E039 Hypothyroidism, unspecified: Secondary | ICD-10-CM | POA: Diagnosis not present

## 2016-06-16 DIAGNOSIS — Z01419 Encounter for gynecological examination (general) (routine) without abnormal findings: Secondary | ICD-10-CM | POA: Diagnosis not present

## 2017-04-06 DIAGNOSIS — F4325 Adjustment disorder with mixed disturbance of emotions and conduct: Secondary | ICD-10-CM | POA: Diagnosis not present

## 2017-04-13 DIAGNOSIS — F4325 Adjustment disorder with mixed disturbance of emotions and conduct: Secondary | ICD-10-CM | POA: Diagnosis not present

## 2017-04-20 DIAGNOSIS — F4325 Adjustment disorder with mixed disturbance of emotions and conduct: Secondary | ICD-10-CM | POA: Diagnosis not present

## 2017-06-15 DIAGNOSIS — F4325 Adjustment disorder with mixed disturbance of emotions and conduct: Secondary | ICD-10-CM | POA: Diagnosis not present

## 2017-06-16 DIAGNOSIS — Z01419 Encounter for gynecological examination (general) (routine) without abnormal findings: Secondary | ICD-10-CM | POA: Diagnosis not present

## 2017-06-16 DIAGNOSIS — Z1231 Encounter for screening mammogram for malignant neoplasm of breast: Secondary | ICD-10-CM | POA: Diagnosis not present

## 2017-06-16 DIAGNOSIS — R7989 Other specified abnormal findings of blood chemistry: Secondary | ICD-10-CM | POA: Diagnosis not present

## 2017-06-16 DIAGNOSIS — E039 Hypothyroidism, unspecified: Secondary | ICD-10-CM | POA: Diagnosis not present

## 2017-06-16 DIAGNOSIS — Z6824 Body mass index (BMI) 24.0-24.9, adult: Secondary | ICD-10-CM | POA: Diagnosis not present

## 2017-06-19 DIAGNOSIS — Z0000 Encounter for general adult medical examination without abnormal findings: Secondary | ICD-10-CM | POA: Diagnosis not present

## 2017-06-19 DIAGNOSIS — Z130 Encounter for screening for diseases of the blood and blood-forming organs and certain disorders involving the immune mechanism: Secondary | ICD-10-CM | POA: Diagnosis not present

## 2017-06-19 DIAGNOSIS — Z13220 Encounter for screening for lipoid disorders: Secondary | ICD-10-CM | POA: Diagnosis not present

## 2017-07-02 DIAGNOSIS — F4325 Adjustment disorder with mixed disturbance of emotions and conduct: Secondary | ICD-10-CM | POA: Diagnosis not present

## 2018-02-19 DIAGNOSIS — Z23 Encounter for immunization: Secondary | ICD-10-CM | POA: Diagnosis not present

## 2018-05-28 ENCOUNTER — Ambulatory Visit (HOSPITAL_COMMUNITY)
Admission: EM | Admit: 2018-05-28 | Discharge: 2018-05-28 | Disposition: A | Discharge status: Home / Self Care | Payer: BLUE CROSS/BLUE SHIELD | Source: Home / Self Care

## 2018-05-28 ENCOUNTER — Emergency Department (HOSPITAL_COMMUNITY): Payer: BLUE CROSS/BLUE SHIELD

## 2018-05-28 ENCOUNTER — Encounter (HOSPITAL_COMMUNITY): Payer: Self-pay | Admitting: Emergency Medicine

## 2018-05-28 ENCOUNTER — Emergency Department (HOSPITAL_COMMUNITY)
Admission: EM | Admit: 2018-05-28 | Discharge: 2018-05-28 | Disposition: A | Discharge status: Home / Self Care | Payer: BLUE CROSS/BLUE SHIELD | Attending: Emergency Medicine | Admitting: Emergency Medicine

## 2018-05-28 ENCOUNTER — Other Ambulatory Visit: Payer: Self-pay

## 2018-05-28 DIAGNOSIS — R0602 Shortness of breath: Secondary | ICD-10-CM | POA: Diagnosis present

## 2018-05-28 DIAGNOSIS — Z79899 Other long term (current) drug therapy: Secondary | ICD-10-CM | POA: Diagnosis not present

## 2018-05-28 DIAGNOSIS — R000 Tachycardia, unspecified: Secondary | ICD-10-CM | POA: Diagnosis not present

## 2018-05-28 DIAGNOSIS — R079 Chest pain, unspecified: Secondary | ICD-10-CM | POA: Diagnosis not present

## 2018-05-28 DIAGNOSIS — F17210 Nicotine dependence, cigarettes, uncomplicated: Secondary | ICD-10-CM | POA: Insufficient documentation

## 2018-05-28 DIAGNOSIS — E039 Hypothyroidism, unspecified: Secondary | ICD-10-CM | POA: Insufficient documentation

## 2018-05-28 DIAGNOSIS — Z7982 Long term (current) use of aspirin: Secondary | ICD-10-CM | POA: Insufficient documentation

## 2018-05-28 DIAGNOSIS — F439 Reaction to severe stress, unspecified: Secondary | ICD-10-CM

## 2018-05-28 DIAGNOSIS — Z711 Person with feared health complaint in whom no diagnosis is made: Secondary | ICD-10-CM | POA: Diagnosis not present

## 2018-05-28 DIAGNOSIS — Z733 Stress, not elsewhere classified: Secondary | ICD-10-CM | POA: Diagnosis not present

## 2018-05-28 LAB — BASIC METABOLIC PANEL
Anion gap: 13 (ref 5–15)
BUN: 13 mg/dL (ref 6–20)
CO2: 20 mmol/L — ABNORMAL LOW (ref 22–32)
Calcium: 9.2 mg/dL (ref 8.9–10.3)
Chloride: 109 mmol/L (ref 98–111)
Creatinine, Ser: 1.07 mg/dL — ABNORMAL HIGH (ref 0.44–1.00)
GFR calc Af Amer: >60 mL/min (ref >60)
GFR calc non Af Amer: 59 mL/min — ABNORMAL LOW (ref >60)
Glucose, Bld: 94 mg/dL (ref 70–99)
Potassium: 4.2 mmol/L (ref 3.5–5.1)
Sodium: 142 mmol/L (ref 135–145)

## 2018-05-28 LAB — CBC
HCT: 44.9 % (ref 36.0–46.0)
Hemoglobin: 15.1 g/dL — ABNORMAL HIGH (ref 12.0–15.0)
MCH: 32.9 pg (ref 26.0–34.0)
MCHC: 33.6 g/dL (ref 30.0–36.0)
MCV: 97.8 fL (ref 80.0–100.0)
Platelets: 268 10*3/uL (ref 150–400)
RBC: 4.59 MIL/uL (ref 3.87–5.11)
RDW: 12.3 % (ref 11.5–15.5)
WBC: 9.2 10*3/uL (ref 4.0–10.5)
nRBC: 0 % (ref 0.0–0.2)

## 2018-05-28 LAB — I-STAT TROPONIN, ED: Troponin i, poc: 0.01 ng/mL (ref 0.00–0.08)

## 2018-05-28 NOTE — ED Notes (Signed)
Pt sent to ED per provider for symptoms  Provider Enrique Sack

## 2018-05-28 NOTE — Discharge Instructions (Addendum)
The testing today was reassuring.  There is no sign of cardiac, blood pressure or kidney problems.  Some of your symptoms may be related to menopausal hormonal changes.  It will help to see your gynecologist for further testing, of your hormone levels including gynecologic and thyroid.  Try to work on stress management, decreasing smoking and caffeine intake.  Return here if needed for problems.

## 2018-05-28 NOTE — ED Provider Notes (Signed)
[facility] EMERGENCY DEPARTMENT Provider Note   CSN: [id number] Arrival date & time: 05/28/18  1811     History   Chief Complaint Chief Complaint  Patient presents with  . Chest Pain  . Shortness of Breath    HPI Nichole Manning is a 52 y.o. female.  HPI   She presents for evaluation of nervousness, chest pressure, left arm and leg numbness, periods of hot flashes, feeling stressed and high blood pressure.  Her husband checked her blood pressure at home and it was elevated.  No history of high blood pressure.  She takes spironolactone, to lower her cortisol levels by her report.  She takes thyroid medicine, for hypothyroidism.  She is a longtime smoker.  She has chronic cough with sputum production.  Denies fever, chills, nausea, vomiting, diaphoresis or focal weakness.  There are no other known modifying factors.  Past Medical History:  Diagnosis Date  . Anxiety   . Fibroids   . Hypothyroidism   . SVD (spontaneous vaginal delivery)    x 3    Patient Active Problem List   Diagnosis Date Noted  . Fibroids 07/01/2013  . HYPOTHYROIDISM 07/25/2008  . CONSTIPATION 07/25/2008  . HEADACHE 07/25/2008    Past Surgical History:  Procedure Laterality Date  . BILATERAL SALPINGECTOMY Bilateral 07/01/2013   Procedure: BILATERAL SALPINGECTOMY;  Surgeon: Lovenia Kim, MD;  Location: [facility];  Service: Gynecology;  Laterality: Bilateral;  . BREAST SURGERY     breast reduction x 2  . TUBAL LIGATION    . WISDOM TOOTH EXTRACTION       OB History   No obstetric history on file.      Home Medications    Prior to Admission medications   Medication Sig Start Date End Date Taking? Authorizing Provider  ALPRAZolam Duanne Moron) 0.5 MG tablet Take 1 tablet (0.5 mg total) by mouth at bedtime as needed for anxiety. 07/02/13   Brien Few, MD  Aspirin-Salicylamide-Caffeine (BC HEADACHE POWDER PO) Take 1 packet by mouth as needed (for headaches).    [provider]  Biotin 5000 MCG CAPS Take 5,000 mcg by mouth daily.    [provider]  levothyroxine (SYNTHROID, LEVOTHROID) 150 MCG tablet Take 150 mcg by mouth daily before breakfast.    [provider]  norethindrone (AYGESTIN) 5 MG tablet Take 5 mg by mouth daily.    [provider]  oxyCODONE-acetaminophen (PERCOCET/ROXICET) 5-325 MG per tablet Take 1-2 tablets by mouth every 3 (three) hours as needed for severe pain. 07/02/13   Brien Few, MD  Prenatal Vit-Fe Fumarate-FA (MULTIVITAMIN-PRENATAL) 27-0.8 MG TABS tablet Take 1 tablet by mouth daily at 12 noon.    [provider]  traMADol (ULTRAM) 50 MG tablet Take 1-2 tablets (50-100 mg total) by mouth every 6 (six) hours as needed for moderate pain. 07/02/13   Brien Few, MD    Family History No family history on file.  Social History Social History   Tobacco Use  . Smoking status: Current Every Day Smoker    Packs/day: 1.50    Years: 30.00    Pack years: 45.00    Types: Cigarettes  . Smokeless tobacco: Never Used  Substance Use Topics  . Alcohol use: Yes    Comment: social  . Drug use: No     Allergies   Sulfa antibiotics; Adhesive [tape]; and Codeine   Review of Systems Review of Systems  All other systems reviewed and are negative.  Physical Exam Updated Vital Signs BP 109/81   Pulse 73   Temp 98.1 F (36.7 C) (Oral)   Resp (!) 21   Ht 5\' 4"  (1.626 m)   Wt 59 kg   SpO2 100%   BMI 22.31 kg/m   Physical Exam Vitals signs and nursing note reviewed.  Constitutional:      Appearance: She is well-developed. She is not ill-appearing or diaphoretic.  HENT:     Head: Normocephalic and atraumatic.     Right Ear: External ear normal.     Left Ear: External ear normal.  Eyes:     Conjunctiva/sclera: Conjunctivae normal.     Pupils: Pupils are equal, round, and reactive to light.  Neck:     Musculoskeletal: Normal range of motion and neck supple.     Trachea:  Phonation normal.  Cardiovascular:     Rate and Rhythm: Normal rate and regular rhythm.     Heart sounds: Normal heart sounds.  Pulmonary:     Effort: Pulmonary effort is normal.     Breath sounds: Normal breath sounds.  Abdominal:     Palpations: Abdomen is soft.     Tenderness: There is no abdominal tenderness.  Musculoskeletal: Normal range of motion.  Skin:    General: Skin is warm and dry.  Neurological:     Mental Status: She is alert and oriented to person, place, and time.     Cranial Nerves: No cranial nerve deficit.     Sensory: No sensory deficit.     Motor: No abnormal muscle tone.     Coordination: Coordination normal.  Psychiatric:        Behavior: Behavior normal.        Thought Content: Thought content normal.        Judgment: Judgment normal.      ED Treatments / Results  Labs (all labs ordered are listed, but only abnormal results are displayed) Labs Reviewed  BASIC METABOLIC PANEL - Abnormal; Notable for the following components:      Result Value   CO2 20 (*)    Creatinine, Ser 1.07 (*)    GFR calc non Af Amer 59 (*)    All other components within normal limits  CBC - Abnormal; Notable for the following components:   Hemoglobin 15.1 (*)    All other components within normal limits  I-STAT TROPONIN, ED    EKG EKG Interpretation  Date/Time:  Friday May 28 2018 18:21:05 EST Ventricular Rate:  101 PR Interval:  134 QRS Duration: 74 QT Interval:  322 QTC Calculation: 417 R Axis:   87 Text Interpretation:  Sinus tachycardia Possible Left atrial enlargement Borderline ECG No old tracing to compare Confirmed by Daleen Bo [phone]) on 05/28/2018 9:07:06 PM   Radiology Dg Chest 2 View  Result Date: 05/28/2018 CLINICAL DATA:  Pt states 2-3 week heaviness in left chest area. Heavy feeling in L. chest getting worse. Left arm numbness. Pt describes feeling "heart pounding". Smoker x 40 years. No hx of heart or lung surgery or disease. cp EXAM:  CHEST - 2 VIEW COMPARISON:  Radiograph 04/05/2008 FINDINGS: Normal mediastinum and cardiac silhouette. Normal pulmonary vasculature. No evidence of effusion, infiltrate, or pneumothorax. No acute bony abnormality. Bilateral subglandular breast implants noted. IMPRESSION: No active cardiopulmonary disease. Electronically Signed   By: Suzy Bouchard M.D.   On: 05/28/2018 20:17    Procedures Procedures (including critical care time)  Medications Ordered in ED Medications - No data to display  Initial Impression / Assessment and Plan / ED Course  I have reviewed the triage vital signs and the nursing notes.  Pertinent labs & imaging results that were available during my care of the patient were reviewed by me and considered in my medical decision making (see chart for details).  Clinical Course as of May 29 2331  Fri May 28, 2018  2131 Normal  I-stat troponin, ED [EW]  2131 Normal  CBC(!) [EW]  2132 Normal except CO2 low, creatinine high, GFR low  Basic metabolic panel(!) [EW]  6045 No infiltrate or CHF, images reviewed by me  DG Chest 2 View [EW]    Clinical Course User Index [EW] Daleen Bo, MD     Patient Vitals for the past 24 hrs:  BP Temp Temp src Pulse Resp SpO2 Height Weight  05/28/18 2115 109/81 - - 73 (!) 21 100 % - -  05/28/18 2107 115/76 98.1 F (36.7 C) Oral 78 16 100 % - -  05/28/18 1812 - - - - - - 5\' 4"  (1.626 m) 59 kg    9:33 PM Reevaluation with update and discussion. After initial assessment and treatment, an updated evaluation reveals no change in clinical status.  Findings discussed with patient and her husband, all questions were answered. Daleen Bo   Medical Decision Making: Nonspecific symptoms with stress.  Doubt ACS, PE or pneumonia.  Doubt CVA or myelopathy.  CRITICAL CARE-no Performed by: Daleen Bo   Nursing Notes Reviewed/ Care Coordinated Applicable Imaging Reviewed Interpretation of Laboratory Data incorporated into ED  treatment  The patient appears reasonably screened and/or stabilized for discharge and I doubt any other medical condition or other [facility] requiring further screening, evaluation, or treatment in the ED at this time prior to discharge.  Plan: Home Medications-continue usual medications; Home Treatments-rest, fluids; return here if the recommended treatment, does not improve the symptoms; Recommended follow up-PCP, PRN   Final Clinical Impressions(s) / ED Diagnoses   Final diagnoses:  Worried well  Stress    ED Discharge Orders    None       Daleen Bo, MD 05/28/18 2334

## 2018-05-28 NOTE — ED Triage Notes (Signed)
Pt coming from urgent care with left sided chest pain/pressure that started a couple of days ago and became worse today. Pt reports sob, diaphoresis, headaches, and left arm numbness. Pt is very anxious in triage.

## 2018-05-28 NOTE — ED Notes (Signed)
Patient Alert and oriented to baseline. Stable and ambulatory to baseline. Patient verbalized understanding of the discharge instructions.  Patient belongings were taken by the patient.   

## 2018-06-11 DIAGNOSIS — J0100 Acute maxillary sinusitis, unspecified: Secondary | ICD-10-CM | POA: Diagnosis not present

## 2018-07-19 ENCOUNTER — Ambulatory Visit (INDEPENDENT_AMBULATORY_CARE_PROVIDER_SITE_OTHER): Payer: BLUE CROSS/BLUE SHIELD | Admitting: Internal Medicine

## 2018-07-19 ENCOUNTER — Encounter: Payer: Self-pay | Admitting: Internal Medicine

## 2018-07-19 VITALS — BP 124/86 | HR 104 | Ht 64.25 in | Wt 135.0 lb

## 2018-07-19 DIAGNOSIS — E039 Hypothyroidism, unspecified: Secondary | ICD-10-CM

## 2018-07-19 NOTE — Progress Notes (Signed)
Name: Nichole Manning  MRN/ DOB: [id number], Nov 23, 1965    Age/ Sex: 53 y.o., female    PCP: Brien Few, MD   Reason for Endocrinology Evaluation: Hypothyroidism     Date of Initial Endocrinology Evaluation: 07/19/2018     HPI: [name] is a 53 y.o. female with unremarkable past medical history . The patient presented for initial endocrinology clinic visit on 07/19/2018 for consultative assistance with her hypothyroidism.   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   She has been on Levothyroxine 150 mcg daily for years, she is compliant with it .  She is on biotin daily   Her main issue today is anxiety and jittery sensation as well as palpitations. She denies any local neck symptoms She does have diarrhea and tingling numbness of the left upper and lower extremities. No family history of thyroid disease   HISTORY:  Past Medical History:  Past Medical History:  Diagnosis Date  . Anxiety   . Fibroids   . Hypothyroidism   . SVD (spontaneous vaginal delivery)    x 3   Past Surgical History:  Past Surgical History:  Procedure Laterality Date  . BILATERAL SALPINGECTOMY Bilateral 07/01/2013   Procedure: BILATERAL SALPINGECTOMY;  Surgeon: Lovenia Kim, MD;  Location: [facility];  Service: Gynecology;  Laterality: Bilateral;  . BREAST SURGERY     breast reduction x 2  . TUBAL LIGATION    . WISDOM TOOTH EXTRACTION        Social History:  reports that she has been smoking cigarettes. She has a 45.00 pack-year smoking history. She has never used smokeless tobacco. She reports current alcohol use. She reports that she does not use drugs.  Family History: family history is not on file.   HOME MEDICATIONS: Allergies as of 07/19/2018      Reactions   Sulfa Antibiotics Hives   Adhesive [tape] Itching   Rxn was from [place].   Codeine Nausea  And Vomiting      Medication List       Accurate as of July 19, 2018  1:18 PM. Always use your most recent med list.        ALPRAZolam 0.5 MG tablet Commonly known as:  XANAX Take 1 tablet (0.5 mg total) by mouth at bedtime as needed for anxiety.   BC HEADACHE POWDER PO Take 1 packet by mouth as needed (for headaches).   Biotin 5000 MCG Caps Take 5,000 mcg by mouth daily.   levothyroxine 150 MCG tablet Commonly known as:  SYNTHROID, LEVOTHROID Take 150 mcg by mouth daily before breakfast.   multivitamin-prenatal 27-0.8 MG Tabs tablet Take 1 tablet by mouth daily at 12 noon.   norethindrone 5 MG tablet Commonly known as:  AYGESTIN Take 5 mg by mouth daily.   oxyCODONE-acetaminophen 5-325 MG tablet Commonly known as:  PERCOCET/ROXICET Take 1-2 tablets by mouth every 3 (three) hours as needed for severe pain.   traMADol 50 MG tablet Commonly known as:  ULTRAM Take 1-2 tablets (50-100 mg total) by mouth every 6 (six) hours as needed for moderate pain.         REVIEW OF SYSTEMS: A comprehensive ROS was conducted with the patient and is negative except as per HPI and below:  Review of Systems  Constitutional: Positive for weight loss. Negative for malaise/fatigue.  HENT: Negative for congestion and sore  throat.   Eyes: Positive for blurred vision. Negative for pain.  Respiratory: Positive for cough. Negative for shortness of breath.   Cardiovascular: Positive for palpitations. Negative for chest pain.  Gastrointestinal: Positive for diarrhea. Negative for nausea.  Genitourinary: Positive for frequency.  Neurological: Positive for tingling and tremors.  Endo/Heme/Allergies: Positive for polydipsia.  Psychiatric/Behavioral: Negative for depression. The patient is nervous/anxious.        OBJECTIVE:  VS: BP 124/86 (BP Location: Left Arm, Patient Position: Sitting, Cuff Size: Normal)   Pulse (!) 104   Wt 135 lb (61.2 kg)   SpO2 98%   BMI 23.17 kg/m    Wt  Readings from Last 3 Encounters:  07/19/18 135 lb (61.2 kg)  05/28/18 130 lb (59 kg)  07/01/13 141 lb (64 kg)     EXAM: General: Pt appears well and is in NAD  Eyes: External eye exam normal without stare, lid lag or exophthalmos.  EOM intact.    Ears, Nose, Throat: Hearing: Grossly intact bilaterally Dental: Good dentition  Throat: Clear without mass, erythema or exudate  Neck: General: Supple without adenopathy. Thyroid: Thyroid size normal.  No goiter or nodules appreciated. No thyroid bruit.  Lungs: Clear with good BS bilat with no rales, rhonchi, or wheezes  Heart: Auscultation: Tachycardic  Abdomen: Normoactive bowel sounds, soft, nontender, without masses or organomegaly palpable  Extremities:  BL LE: No pretibial edema normal ROM and strength.  Skin: Hair: Texture and amount normal with gender appropriate distribution Skin Inspection: No rashes. Skin Palpation: Skin temperature, texture, and thickness normal to palpation  Neuro: Cranial nerves: II - XII grossly intact  Motor: Normal strength throughout DTRs: 2+ and symmetric in UE without delay in relaxation phase  Mental Status: Judgment, insight: Intact Orientation: Oriented to time, place, and person Mood and affect: No depression, anxiety, or agitation     DATA REVIEWED: 06/19/2017  TSH 0.00 uIU/mL     ASSESSMENT/PLAN/RECOMMENDATIONS:   1. Hypothyroidism :  - Patient is clinically hyperthyroid,  -I am not able to check her TFTs today because she took biotin today, she was advised to hold off on the biotin for 3 days prior to TFT checks in the future. - Even though I am not able to check her TFTs today based on her January 2019 TSH level of 0.00 uIU/mL , and her symptoms from today looks like she has iatrogenic hyperthyroidism, patient under the impression that she needs more levothyroxine.  I have explained to the patient that excessive thyroid hormone increases her risk of osteoporosis, bone fractures, and  cardiac arrhythmias. -I am going to cut her levothyroxine a little bit just based on her symptoms and she will hold off on the biotin and come back for blood work on Friday, February 14   Medications : Decrease levothyroxine to 150 MCG, 6 days a week   Follow-up in 8 weeks  Signed electronically by: Mack Guise, MD  [facility] Endocr[facility]  [facility] [address] [place], [place] [address] Phone: [phone] FAX: [phone]   CC: Brien Manning, [place] [address] Phone: [phone] Fax: [phone]   Return to Endocrinology clinic as below: No future appointments.

## 2018-07-19 NOTE — Patient Instructions (Addendum)
-   Decrease Levothyroxine to 150 mcg 6 days a week  - Recheck your thyroid on Friday - Hold Biotin or anything that says "good for hair,skin and nails" for 3 days prior to blood tests

## 2018-07-23 ENCOUNTER — Other Ambulatory Visit (INDEPENDENT_AMBULATORY_CARE_PROVIDER_SITE_OTHER): Payer: Self-pay

## 2018-07-23 DIAGNOSIS — E039 Hypothyroidism, unspecified: Secondary | ICD-10-CM

## 2018-07-23 LAB — TSH: TSH: <0.01 u[IU]/mL — ABNORMAL LOW (ref 0.35–4.50)

## 2018-07-23 LAB — T4, FREE: Free T4: 0.8 ng/dL (ref 0.60–1.60)

## 2018-07-26 ENCOUNTER — Telehealth: Payer: Self-pay | Admitting: Internal Medicine

## 2018-07-26 DIAGNOSIS — E039 Hypothyroidism, unspecified: Secondary | ICD-10-CM

## 2018-07-26 MED ORDER — LEVOTHYROXINE SODIUM 100 MCG PO TABS: 100.0000 ug | ORAL_TABLET | Freq: Every day | ORAL | 1 refills | Status: DC

## 2018-07-26 NOTE — Telephone Encounter (Signed)
  Discussed lab result with the patient, she has a suppressed TSH which means the 150 mcg of LT-4 replacement is clearly too much for her.   She was supposed to skip one day a week of levothyroxine but she has only skipped 2 days since she saw   To simplify her regimen will change the dose so she can continue to take it once a day      Results for KIMBERLIE, CSASZAR (MRN [id number]) as of 07/26/2018 12:30  Ref. Range 07/23/2018 15:16  TSH Latest Ref Range: 0.35 - 4.50 uIU/mL <0.01 Repeated and verified X2. (L)  T4,Free(Direct) Latest Ref Range: 0.60 - 1.60 ng/dL 0.80     Stop Levothyroxine 150 mcg daily  Start Levothyroxine 100 mcg daily  Recheck on next visit     Abby Nena Jordan, MD  [facility]  [facility] [address] [place], [place] [address] Phone: [phone] FAX: [phone]

## 2018-08-25 ENCOUNTER — Ambulatory Visit: Payer: Self-pay | Admitting: Primary Care

## 2018-08-26 ENCOUNTER — Ambulatory Visit: Payer: Self-pay | Admitting: Primary Care

## 2018-09-06 ENCOUNTER — Ambulatory Visit: Payer: Self-pay | Admitting: Internal Medicine

## 2018-11-24 ENCOUNTER — Emergency Department (HOSPITAL_COMMUNITY): Payer: Medicaid Other

## 2018-11-24 ENCOUNTER — Encounter (HOSPITAL_COMMUNITY): Payer: Self-pay | Admitting: Emergency Medicine

## 2018-11-24 ENCOUNTER — Emergency Department (HOSPITAL_COMMUNITY)
Admission: EM | Admit: 2018-11-24 | Discharge: 2018-11-24 | Disposition: A | Discharge status: Home / Self Care | Payer: Medicaid Other | Attending: Emergency Medicine | Admitting: Emergency Medicine

## 2018-11-24 ENCOUNTER — Other Ambulatory Visit: Payer: Self-pay

## 2018-11-24 DIAGNOSIS — R202 Paresthesia of skin: Secondary | ICD-10-CM | POA: Diagnosis not present

## 2018-11-24 DIAGNOSIS — H9313 Tinnitus, bilateral: Secondary | ICD-10-CM | POA: Diagnosis not present

## 2018-11-24 DIAGNOSIS — Z79899 Other long term (current) drug therapy: Secondary | ICD-10-CM | POA: Insufficient documentation

## 2018-11-24 DIAGNOSIS — E039 Hypothyroidism, unspecified: Secondary | ICD-10-CM | POA: Insufficient documentation

## 2018-11-24 DIAGNOSIS — F17210 Nicotine dependence, cigarettes, uncomplicated: Secondary | ICD-10-CM | POA: Diagnosis not present

## 2018-11-24 DIAGNOSIS — R002 Palpitations: Secondary | ICD-10-CM | POA: Diagnosis not present

## 2018-11-24 DIAGNOSIS — R42 Dizziness and giddiness: Secondary | ICD-10-CM | POA: Diagnosis present

## 2018-11-24 DIAGNOSIS — U071 COVID-19: Secondary | ICD-10-CM

## 2018-11-24 DIAGNOSIS — R51 Headache: Secondary | ICD-10-CM | POA: Diagnosis not present

## 2018-11-24 LAB — CBC WITH DIFFERENTIAL/PLATELET
Abs Immature Granulocytes: 0.02 10*3/uL (ref 0.00–0.07)
Basophils Absolute: 0 10*3/uL (ref 0.0–0.1)
Basophils Relative: 1 %
Eosinophils Absolute: 0 10*3/uL (ref 0.0–0.5)
Eosinophils Relative: 0 %
HCT: 44.9 % (ref 36.0–46.0)
Hemoglobin: 14.9 g/dL (ref 12.0–15.0)
Immature Granulocytes: 0 %
Lymphocytes Relative: 36 %
Lymphs Abs: 2.3 10*3/uL (ref 0.7–4.0)
MCH: 32.6 pg (ref 26.0–34.0)
MCHC: 33.2 g/dL (ref 30.0–36.0)
MCV: 98.2 fL (ref 80.0–100.0)
Monocytes Absolute: 0.3 10*3/uL (ref 0.1–1.0)
Monocytes Relative: 5 %
Neutro Abs: 3.7 10*3/uL (ref 1.7–7.7)
Neutrophils Relative %: 58 %
Platelets: 211 10*3/uL (ref 150–400)
RBC: 4.57 MIL/uL (ref 3.87–5.11)
RDW: 13.1 % (ref 11.5–15.5)
WBC: 6.4 10*3/uL (ref 4.0–10.5)
nRBC: 0 % (ref 0.0–0.2)

## 2018-11-24 LAB — BASIC METABOLIC PANEL
Anion gap: 12 (ref 5–15)
BUN: 12 mg/dL (ref 6–20)
CO2: 23 mmol/L (ref 22–32)
Calcium: 9.2 mg/dL (ref 8.9–10.3)
Chloride: 104 mmol/L (ref 98–111)
Creatinine, Ser: 0.83 mg/dL (ref 0.44–1.00)
GFR calc Af Amer: >60 mL/min (ref >60)
GFR calc non Af Amer: >60 mL/min (ref >60)
Glucose, Bld: 96 mg/dL (ref 70–99)
Potassium: 4 mmol/L (ref 3.5–5.1)
Sodium: 139 mmol/L (ref 135–145)

## 2018-11-24 MED ORDER — SODIUM CHLORIDE 0.9 % IV BOLUS
1000.0000 mL | Freq: Once | INTRAVENOUS | Status: AC
  Administered 2018-11-24: 1000 mL via INTRAVENOUS

## 2018-11-24 NOTE — ED Provider Notes (Signed)
[facility] DEPARTMENT Provider Note   CSN: [id number] Arrival date & time: 11/24/18  1019     History   Chief Complaint Chief Complaint  Patient presents with  . Dizziness    HPI Nichole Manning is a 53 y.o. female.     The history is provided by the patient and medical records. No language interpreter was used.     53 year old female with history of anxiety presenting to the ED with concerning for stroke.  Patient states she went to [facility] for approximately 8 days, went home 10 days ago.  3 days afterwards she developed sinus congestion, loss of sense of taste and smell.  She had a COVID-19 test done on that date and 3 days ago she was notified that she test positive for COVID-19.  Since then she has been self quarantine with her 67 year old son who also went to the beach trip.  This morning she woke up reporting having extreme bouts of dizziness and described as room spinning sensation, states her heart was racing, complaining of tingling sensation to her hands left greater than right as well as tingling sensation around face.  She was voicing concern for TIA.  She admits that she is a heavy smoker smoking approximately a pack a cigarette a day.  She does not complain of any significant cough or fever.  She does endorse some mild throbbing headache.  No hemoptysis, no pleuritic chest pain or significant shortness of breath.  No history of prior stroke.  Report ringing sound in the ears   Past Medical History:  Diagnosis Date  . Anxiety   . Fibroids   . Hypothyroidism   . SVD (spontaneous vaginal delivery)    x 3    Patient Active Problem List   Diagnosis Date Noted  . Fibroids 07/01/2013  . HYPOTHYROIDISM 07/25/2008  . CONSTIPATION 07/25/2008  . HEADACHE 07/25/2008    Past Surgical History:  Procedure Laterality Date  . BILATERAL SALPINGECTOMY Bilateral 07/01/2013   Procedure: BILATERAL SALPINGECTOMY;  Surgeon: Lovenia Kim, MD;   Location: [facility];  Service: Gynecology;  Laterality: Bilateral;  . BREAST SURGERY     breast reduction x 2  . TUBAL LIGATION    . WISDOM TOOTH EXTRACTION       OB History   No obstetric history on file.      Home Medications    Prior to Admission medications   Medication Sig Start Date End Date Taking? Authorizing Provider  ALPRAZolam Duanne Moron) 0.5 MG tablet Take 1 tablet (0.5 mg total) by mouth at bedtime as needed for anxiety. 07/02/13   Brien Few, MD  Aspirin-Salicylamide-Caffeine (BC HEADACHE POWDER PO) Take 1 packet by mouth as needed (for headaches).    [provider]  Biotin 5000 MCG CAPS Take 5,000 mcg by mouth daily.    [provider]  levothyroxine (SYNTHROID, LEVOTHROID) 100 MCG tablet Take 1 tablet (100 mcg total) by mouth daily. 07/26/18   Shamleffer, Melanie Crazier, MD  norethindrone (AYGESTIN) 5 MG tablet Take 5 mg by mouth daily.    [provider]  oxyCODONE-acetaminophen (PERCOCET/ROXICET) 5-325 MG per tablet Take 1-2 tablets by mouth every 3 (three) hours as needed for severe pain. 07/02/13   Brien Few, MD  Prenatal Vit-Fe Fumarate-FA (MULTIVITAMIN-PRENATAL) 27-0.8 MG TABS tablet Take 1 tablet by mouth daily at 12 noon.    [provider]  traMADol (ULTRAM) 50 MG tablet Take 1-2 tablets (50-100 mg total) by mouth every  6 (six) hours as needed for moderate pain. 07/02/13   Brien Few, MD    Family History No family history on file.  Social History Social History   Tobacco Use  . Smoking status: Current Every Day Smoker    Packs/day: 1.50    Years: 30.00    Pack years: 45.00    Types: Cigarettes  . Smokeless tobacco: Never Used  Substance Use Topics  . Alcohol use: Yes    Comment: social  . Drug use: No     Allergies   Sulfa antibiotics, Adhesive [tape], and Codeine   Review of Systems Review of Systems  All other systems reviewed and are negative.    Physical Exam Updated Vital Signs BP (!)  122/91   Pulse 90   Resp 15   Ht 5\' 4"  (1.626 m)   Wt 56.7 kg   SpO2 96%   BMI 21.46 kg/m   Physical Exam Vitals signs and nursing note reviewed.  Constitutional:      General: She is not in acute distress.    Appearance: She is well-developed.     Comments: Patient appears anxious, tearful but in no acute respiratory distress.  HENT:     Head: Atraumatic.     Nose: Nose normal.  Eyes:     Extraocular Movements: Extraocular movements intact.     Conjunctiva/sclera: Conjunctivae normal.     Pupils: Pupils are equal, round, and reactive to light.  Neck:     Musculoskeletal: Normal range of motion and neck supple.  Cardiovascular:     Rate and Rhythm: Normal rate and regular rhythm.     Heart sounds: No murmur. No friction rub. No gallop.   Pulmonary:     Effort: Pulmonary effort is normal.     Breath sounds: Normal breath sounds.  Abdominal:     General: Abdomen is flat.     Palpations: Abdomen is soft.     Tenderness: There is no abdominal tenderness.  Skin:    Findings: No rash.  Neurological:     General: No focal deficit present.     Mental Status: She is alert and oriented to person, place, and time.     Cranial Nerves: No cranial nerve deficit.     Sensory: No sensory deficit.     Motor: No weakness.     Coordination: Coordination normal.     Gait: Gait normal.     Deep Tendon Reflexes: Reflexes normal.      ED Treatments / Results  Labs (all labs ordered are listed, but only abnormal results are displayed) Labs Reviewed  BASIC METABOLIC PANEL  CBC WITH DIFFERENTIAL/PLATELET    EKG EKG Interpretation  Date/Time:  Wednesday November 24 2018 10:41:08 EDT Ventricular Rate:  87 PR Interval:    QRS Duration: 80 QT Interval:  362 QTC Calculation: 436 R Axis:   93 Text Interpretation:  Sinus rhythm Consider right atrial enlargement Borderline right axis deviation Confirmed by Isla Pence [phone]) on 11/24/2018 11:40:52 AM    Date: 11/24/2018  Rate: 87   Rhythm: normal sinus rhythm  QRS Axis: borderline RAD  Intervals: normal  ST/T Wave abnormalities: normal  Conduction Disutrbances: none  Narrative Interpretation:   Old EKG Reviewed: No significant changes noted     Radiology Dg Chest Port 1 View  Result Date: 11/24/2018 CLINICAL DATA:  COVID-19 positive.  Dizziness. EXAM: PORTABLE CHEST 1 VIEW COMPARISON:  Chest x-ray 05/28/2018. FINDINGS: Mediastinum and hilar structures normal. Lungs are clear. No pleural  effusion or pneumothorax. Heart size normal. Bilateral breast implants. Thoracic spine scoliosis. IMPRESSION: No acute cardiopulmonary disease. Electronically Signed   By: Marcello Moores  Register   On: 11/24/2018 11:36    Procedures Procedures (including critical care time)  Medications Ordered in ED Medications  sodium chloride 0.9 % bolus 1,000 mL (1,000 mLs Intravenous New Bag/Given 11/24/18 1054)     Initial Impression / Assessment and Plan / ED Course  I have reviewed the triage vital signs and the nursing notes.  Pertinent labs & imaging results that were available during my care of the patient were reviewed by me and considered in my medical decision making (see chart for details).        BP (!) 122/91   Pulse 90   Resp 15   Ht 5\' 4"  (1.626 m)   Wt 56.7 kg   SpO2 96%   BMI 21.46 kg/m    Final Clinical Impressions(s) / ED Diagnoses   Final diagnoses:  COVID-19 virus infection    ED Discharge Orders    None     10:56 AM Patient with underlying history of anxiety here with concerning for stroke.  Patient states she test positive for COVID-19 several days ago and today developing dizziness tingling sensation and other symptom likely suggestive of anxiety attack and less likely to be TIA or stroke.  Work up initiated.  IVF given. Care discussed with DR. Haviland.  12:08 PM EKG without concerning arrhythmia, CXR unremarkable, labs are reassuring.  Low suspicion for stroke/TIA.  ABCD2 score of zero, low risk  of stroke.  Dr. Gilford Raid have also evaluated pt and felt this is likely anxiety induce.  Pt reassured.  Stable for discharge.  Encourage continue with self quarantine for appropriate time as detailed in discharge guideline.  Return precaution given.   Nichole Manning was evaluated in Emergency Department on 11/24/2018 for the symptoms described in the history of present illness. She was evaluated in the context of the global COVID-19 pandemic, which necessitated consideration that the patient might be at risk for infection with the SARS-CoV-2 virus that causes COVID-19. Institutional protocols and algorithms that pertain to the evaluation of patients at risk for COVID-19 are in a state of rapid change based on information released by regulatory bodies including the CDC and federal and state organizations. These policies and algorithms were followed during the patient's care in the ED.     Domenic Moras, PA-C 11/24/18 1220    Isla Pence, MD 11/24/18 1529

## 2018-11-24 NOTE — ED Triage Notes (Signed)
PT has questions about DX and wants talk to Provder before dc home.

## 2018-11-24 NOTE — ED Triage Notes (Addendum)
Pt states she started having nasal congestion on Wednesday.  By Friday, pt lost sense of taste and smell. Pt was tested for Covid and tested positive. Pt received results on Sunday. Pt has been quarantining. Pt has had diarrhea since Monday. Pt woke up from her sleep today feeling her head spin horribly. Pt had to crawl to toilet due to feeling so dizzy. Pt states she "isn't breathing normal, but no fever, cough." Pt denies SOB.

## 2019-01-27 ENCOUNTER — Ambulatory Visit: Payer: Medicaid Other | Admitting: Neurology

## 2019-03-10 NOTE — Progress Notes (Signed)
NEUROLOGY CONSULTATION NOTE  Nichole Manning MRN: [id number] DOB: May 11, 1966  Referring provider: Brien Few, MD Primary care provider: Brien Few, MD  Reason for consult:  Possible CVA  HISTORY OF PRESENT ILLNESS: Nichole Manning is a 53 year old left-handed white woman who presents for possible CVA.  History supplemented by ED and referring provider note.  In June, she was diagnosed with COVID, presenting with congestion, runny nose and loss of taste and smell.  At the same time, she was going through a divorce, which has caused anxiety.  She then developed a "fluid sensation" in both ears and across her forehead.  She also noted bilateral ringing in both of her ears.  On 11/24/2018, she woke up with vertigo, described as the room spinning.  She got out of bed and fell.  She started to pray out loud and noted that she had slurred speech and spoke jibberish.  No facial droop or unilateral numbness or weakness.  She called 911 and EMS arrived.  She was seen in [facility] and told she may have had a COVID-related TIA but no imaging was performed because she was COVID positive.  Symptoms had resolved by the time she got to [facility] (about an hour).  She later went to the ED at [facility] where her neurologic exam was normal.  CT head and CTA of head and neck were unremarkable.  She was diagnosed with probable peripheral vertigo.  She reports that she still feels like she is not speaking right.  She still notes inability to taste, smell, has constant tinnitus and has trouble sleeping.      PAST MEDICAL HISTORY: Past Medical History:  Diagnosis Date   Anxiety    Fibroids    Hypothyroidism    SVD (spontaneous vaginal delivery)    x 3    PAST SURGICAL HISTORY: Past Surgical History:  Procedure Laterality Date   BILATERAL SALPINGECTOMY Bilateral 07/01/2013   Procedure: BILATERAL SALPINGECTOMY;  Surgeon: Lovenia Kim, MD;  Location: [facility];  Service: Gynecology;  Laterality:  Bilateral;   BREAST SURGERY     breast reduction x 2   TUBAL LIGATION     WISDOM TOOTH EXTRACTION      MEDICATIONS: Current Outpatient Medications on File Prior to Visit  Medication Sig Dispense Refill   ALPRAZolam (XANAX) 0.5 MG tablet Take 1 tablet (0.5 mg total) by mouth at bedtime as needed for anxiety. 20 tablet 0   Aspirin-Salicylamide-Caffeine (BC HEADACHE POWDER PO) Take 1 packet by mouth as needed (for headaches).     azithromycin (ZITHROMAX) 250 MG tablet Take 250 mg by mouth daily.     levothyroxine (SYNTHROID) 88 MCG tablet Take 88 mcg by mouth daily.     Multiple Vitamin (MULTIVITAMIN WITH MINERALS) TABS tablet Take 1 tablet by mouth daily.     spironolactone (ALDACTONE) 25 MG tablet Take 25 mg by mouth daily.     vitamin C (ASCORBIC ACID) 500 MG tablet Take 500 mg by mouth daily.     VITAMIN D PO Take by mouth daily.     zinc sulfate 220 (50 Zn) MG capsule Take 220 mg by mouth daily.     No current facility-administered medications on file prior to visit.     ALLERGIES: Allergies  Allergen Reactions   Sulfa Antibiotics Hives   Adhesive [Tape] Itching    Rxn was from [employer].   Codeine Nausea And Vomiting    FAMILY HISTORY: Family History  Problem Relation  Age of Onset   Breast cancer Mother    Cancer Father     SOCIAL HISTORY: Social History   Socioeconomic History   Marital status: Married    Spouse name: Not on file   Number of children: Not on file   Years of education: Not on file   Highest education level: Not on file  Occupational History   Not on file  Social Needs   Financial resource strain: Not on file   Food insecurity    Worry: Not on file    Inability: Not on file   Transportation needs    Medical: Not on file    Non-medical: Not on file  Tobacco Use   Smoking status: Current Every Day Smoker    Packs/day: 1.50    Years: 30.00    Pack years: 45.00    Types: Cigarettes   Smokeless tobacco: Never  Used  Substance and Sexual Activity   Alcohol use: Yes    Comment: social   Drug use: No   Sexual activity: Yes    Birth control/protection: Surgical  Lifestyle   Physical activity    Days per week: Not on file    Minutes per session: Not on file   Stress: Not on file  Relationships   Social connections    Talks on phone: Not on file    Gets together: Not on file    Attends religious service: Not on file    Active member of club or organization: Not on file    Attends meetings of clubs or organizations: Not on file    Relationship status: Not on file   Intimate partner violence    Fear of current or ex partner: Not on file    Emotionally abused: Not on file    Physically abused: Not on file    Forced sexual activity: Not on file  Other Topics Concern   Not on file  Social History Narrative   Not on file    REVIEW OF SYSTEMS: Constitutional: No fevers, chills, or sweats, no generalized fatigue, change in appetite Eyes: No visual changes, double vision, eye pain Ear, nose and throat: tinnitus, loss of taste Cardiovascular: No chest pain, palpitations Respiratory:  No shortness of breath at rest or with exertion, wheezes GastrointestinaI: No nausea, vomiting, diarrhea, abdominal pain, fecal incontinence Genitourinary:  No dysuria, urinary retention or frequency Musculoskeletal:  No neck pain, back pain Integumentary: No rash, pruritus, skin lesions Neurological: as above Psychiatric: anxiety, trouble sleeping Endocrine: No palpitations, fatigue, diaphoresis, mood swings, change in appetite, change in weight, increased thirst Hematologic/Lymphatic:  No purpura, petechiae. Allergic/Immunologic: no itchy/runny eyes, nasal congestion, recent allergic reactions, rashes  PHYSICAL EXAM: Blood pressure 121/80, pulse 84, temperature 98 F (36.7 C), resp. rate 12, height 5\' 4"  (1.626 m), weight 128 lb (58.1 kg), SpO2 95 %. General: No acute distress.  Patient appears  well-groomed.   Head:  Normocephalic/atraumatic Eyes:  fundi examined but not visualized Neck: supple, no paraspinal tenderness, full range of motion Back: No paraspinal tenderness Heart: regular rate and rhythm Lungs: Clear to auscultation bilaterally. Vascular: No carotid bruits. Neurological Exam: Mental status: alert and oriented to person, place, and time, recent and remote memory intact, fund of knowledge intact, attention and concentration intact, speech fluent and not dysarthric, language intact. Cranial nerves: CN I: not tested CN II: pupils equal, round and reactive to light, visual fields intact CN III, IV, VI:  full range of motion, no nystagmus, no ptosis CN  V: facial sensation intact CN VII: upper and lower face symmetric CN VIII: hearing intact CN IX, X: gag intact, uvula midline CN XI: sternocleidomastoid and trapezius muscles intact CN XII: tongue midline Bulk & Tone: normal, no fasciculations. Motor:  5/5 throughout Sensation:  temperature and vibration sensation intact.  Deep Tendon Reflexes:  2+ throughout, toes downgoing.   Finger to nose testing:  Without dysmetria.   Heel to shin:  Without dysmetria.   Gait:  Normal station and stride.  Able to turn and tandem walk. Romberg negative.  IMPRESSION: Episode of vertigo and speech disturbance. COVID infection  She had a transient episode.  Unsure if COVID-related TIA.  Symptoms are not clearly focal.  May have been peripheral vertigo possibly related to her viral infection.  I think her symptoms are residual from COVID and anxiety.  PLAN: We will check MRI of brain Further recommendations pending results. Advised to quit smoking  Thank you for allowing me to take part in the care of this patient.  Metta Clines, DO  CC: Brien Few, MD

## 2019-03-11 ENCOUNTER — Other Ambulatory Visit: Payer: Self-pay

## 2019-03-11 ENCOUNTER — Other Ambulatory Visit: Payer: Self-pay | Admitting: *Deleted

## 2019-03-11 ENCOUNTER — Encounter: Payer: Self-pay | Admitting: Neurology

## 2019-03-11 ENCOUNTER — Ambulatory Visit (INDEPENDENT_AMBULATORY_CARE_PROVIDER_SITE_OTHER): Payer: Medicaid Other | Admitting: Neurology

## 2019-03-11 VITALS — BP 121/80 | HR 84 | Temp 98.0°F | Resp 12 | Ht 64.0 in | Wt 128.0 lb

## 2019-03-11 DIAGNOSIS — Z720 Tobacco use: Secondary | ICD-10-CM

## 2019-03-11 DIAGNOSIS — R2990 Unspecified symptoms and signs involving the nervous system: Secondary | ICD-10-CM | POA: Diagnosis not present

## 2019-03-11 NOTE — Patient Instructions (Signed)
We will check MRI of brain Further recommendations pending results.

## 2019-09-02 ENCOUNTER — Encounter: Payer: Self-pay | Admitting: Cardiology

## 2019-09-02 ENCOUNTER — Telehealth: Payer: Self-pay | Admitting: Obstetrics and Gynecology

## 2019-09-02 NOTE — Telephone Encounter (Signed)
Called patient and left voicemail with behavioral medicine information

## 2019-09-13 ENCOUNTER — Encounter: Payer: Self-pay | Admitting: Cardiology

## 2019-10-06 ENCOUNTER — Ambulatory Visit: Payer: Medicaid Other

## 2019-10-10 ENCOUNTER — Ambulatory Visit
Admission: RE | Admit: 2019-10-10 | Discharge: 2019-10-10 | Disposition: A | Discharge status: Home / Self Care | Payer: Medicaid Other | Source: Ambulatory Visit | Attending: Nurse Practitioner | Admitting: Nurse Practitioner

## 2019-10-10 ENCOUNTER — Other Ambulatory Visit: Payer: Self-pay

## 2019-10-10 ENCOUNTER — Ambulatory Visit (INDEPENDENT_AMBULATORY_CARE_PROVIDER_SITE_OTHER): Payer: Medicaid Other | Admitting: Nurse Practitioner

## 2019-10-10 VITALS — BP 124/78 | HR 84 | Temp 97.5°F

## 2019-10-10 DIAGNOSIS — Z8616 Personal history of COVID-19: Secondary | ICD-10-CM

## 2019-10-10 DIAGNOSIS — R053 Chronic cough: Secondary | ICD-10-CM

## 2019-10-10 DIAGNOSIS — F329 Major depressive disorder, single episode, unspecified: Secondary | ICD-10-CM

## 2019-10-10 DIAGNOSIS — R0602 Shortness of breath: Secondary | ICD-10-CM

## 2019-10-10 DIAGNOSIS — F32A Depression, unspecified: Secondary | ICD-10-CM

## 2019-10-10 DIAGNOSIS — F419 Anxiety disorder, unspecified: Secondary | ICD-10-CM

## 2019-10-10 DIAGNOSIS — F488 Other specified nonpsychotic mental disorders: Secondary | ICD-10-CM

## 2019-10-10 DIAGNOSIS — R002 Palpitations: Secondary | ICD-10-CM | POA: Insufficient documentation

## 2019-10-10 DIAGNOSIS — R5382 Chronic fatigue, unspecified: Secondary | ICD-10-CM

## 2019-10-10 DIAGNOSIS — R05 Cough: Secondary | ICD-10-CM

## 2019-10-10 DIAGNOSIS — R4189 Other symptoms and signs involving cognitive functions and awareness: Secondary | ICD-10-CM

## 2019-10-10 DIAGNOSIS — R413 Other amnesia: Secondary | ICD-10-CM
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
## 2019-10-10 NOTE — Patient Instructions (Addendum)
Shortness of breath  Cough:  Continue albuterol as needed  Stay active  Anxiety Depression Post traumatic stress:  Will place referral to psychiatry  Heart palpitations:  Please follow up with primary care for results of EKG and Echo  Brain Fog Memory loss:  Please follow up with neurology  Hand out given on memory care  Fatigue:  Stay active  Will check labs today   Follow up here as needed
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
## 2019-10-11 ENCOUNTER — Other Ambulatory Visit: Payer: Self-pay | Admitting: Nurse Practitioner

## 2019-10-11 DIAGNOSIS — R0602 Shortness of breath: Secondary | ICD-10-CM

## 2019-10-11 DIAGNOSIS — R053 Chronic cough: Secondary | ICD-10-CM

## 2019-10-11 DIAGNOSIS — R05 Cough: Secondary | ICD-10-CM

## 2019-10-11 LAB — COMPREHENSIVE METABOLIC PANEL
ALT: 11 IU/L (ref 0–32)
AST: 16 IU/L (ref 0–40)
Albumin/Globulin Ratio: 2.4 — ABNORMAL HIGH (ref 1.2–2.2)
Albumin: 4.5 g/dL (ref 3.8–4.9)
Alkaline Phosphatase: 103 IU/L (ref 39–117)
BUN/Creatinine Ratio: 10 (ref 9–23)
BUN: 9 mg/dL (ref 6–24)
Bilirubin Total: 0.4 mg/dL (ref 0.0–1.2)
CO2: 23 mmol/L (ref 20–29)
Calcium: 9.5 mg/dL (ref 8.7–10.2)
Chloride: 103 mmol/L (ref 96–106)
Creatinine, Ser: 0.89 mg/dL (ref 0.57–1.00)
GFR calc Af Amer: 85 mL/min/{1.73_m2} (ref >59)
GFR calc non Af Amer: 74 mL/min/{1.73_m2} (ref >59)
Globulin, Total: 1.9 g/dL (ref 1.5–4.5)
Glucose: 84 mg/dL (ref 65–99)
Potassium: 4.2 mmol/L (ref 3.5–5.2)
Sodium: 141 mmol/L (ref 134–144)
Total Protein: 6.4 g/dL (ref 6.0–8.5)

## 2019-10-11 LAB — CBC WITH DIFFERENTIAL/PLATELET
Basophils Absolute: 0.1 10*3/uL (ref 0.0–0.2)
Basos: 1 %
EOS (ABSOLUTE): 0.1 10*3/uL (ref 0.0–0.4)
Eos: 1 %
Hematocrit: 42.7 % (ref 34.0–46.6)
Hemoglobin: 14.9 g/dL (ref 11.1–15.9)
Immature Grans (Abs): 0 10*3/uL (ref 0.0–0.1)
Immature Granulocytes: 0 %
Lymphocytes Absolute: 3.7 10*3/uL — ABNORMAL HIGH (ref 0.7–3.1)
Lymphs: 47 %
MCH: 34.3 pg — ABNORMAL HIGH (ref 26.6–33.0)
MCHC: 34.9 g/dL (ref 31.5–35.7)
MCV: 98 fL — ABNORMAL HIGH (ref 79–97)
Monocytes Absolute: 0.6 10*3/uL (ref 0.1–0.9)
Monocytes: 7 %
Neutrophils Absolute: 3.4 10*3/uL (ref 1.4–7.0)
Neutrophils: 44 %
Platelets: 255 10*3/uL (ref 150–450)
RBC: 4.34 x10E6/uL (ref 3.77–5.28)
RDW: 11.8 % (ref 11.7–15.4)
WBC: 7.8 10*3/uL (ref 3.4–10.8)

## 2019-10-11 NOTE — Progress Notes (Signed)
Patient notified of results, verbally understood. No additional questions.

## 2019-10-13 ENCOUNTER — Telehealth: Payer: Self-pay

## 2019-10-13 NOTE — Telephone Encounter (Signed)
NOTES ON FILE FROM [facility] [phone], SENT REFERRAL TO SCHEDULING

## 2019-10-31 ENCOUNTER — Telehealth: Payer: Self-pay

## 2019-10-31 NOTE — Telephone Encounter (Signed)
NOTES ON FILE FROM [facility] [phone], SENT REFERRAL TO SCHEDULING

## 2019-11-14 ENCOUNTER — Other Ambulatory Visit: Payer: Self-pay

## 2019-11-14 ENCOUNTER — Encounter: Payer: Self-pay | Admitting: Cardiology

## 2019-11-14 ENCOUNTER — Ambulatory Visit (INDEPENDENT_AMBULATORY_CARE_PROVIDER_SITE_OTHER): Payer: Medicaid Other | Admitting: Cardiology

## 2019-11-14 VITALS — BP 122/84 | HR 99 | Ht 64.0 in | Wt 128.6 lb

## 2019-11-14 DIAGNOSIS — R002 Palpitations: Secondary | ICD-10-CM

## 2019-11-14 DIAGNOSIS — Z716 Tobacco abuse counseling: Secondary | ICD-10-CM

## 2019-11-14 DIAGNOSIS — R931 Abnormal findings on diagnostic imaging of heart and coronary circulation: Secondary | ICD-10-CM

## 2019-11-14 DIAGNOSIS — R5382 Chronic fatigue, unspecified: Secondary | ICD-10-CM

## 2019-11-14 DIAGNOSIS — R0602 Shortness of breath: Secondary | ICD-10-CM

## 2019-11-14 DIAGNOSIS — Z8616 Personal history of COVID-19: Secondary | ICD-10-CM | POA: Diagnosis not present

## 2019-11-14 NOTE — Patient Instructions (Signed)
Medication Instructions:  Your Physician recommend you continue on your current medication as directed.    *If you need a refill on your cardiac medications before your next appointment, please call your pharmacy*   Lab Work: None   Testing/Procedures: Our physician has recommended that you wear an  [place] monitor. The Zio patch cardiac monitor continuously records heart rhythm data for up to 14 days, this is for patients being evaluated for multiple types heart rhythms. For the first 24 hours post application, please avoid getting the Zio monitor wet in the shower or by excessive sweating during exercise. After that, feel free to carry on with regular activities. Keep soaps and lotions away from the ZIO XT Patch.   Someone from our office will call to mail monitor.       Follow-Up: At [facility], you and your health needs are our priority.  As part of our continuing mission to provide you with exceptional heart care, we have created designated Provider Care Teams.  These Care Teams include your primary Cardiologist (physician) and Advanced Practice Providers (APPs -  Physician Assistants and Nurse Practitioners) who all work together to provide you with the care you need, when you need it.  We recommend signing up for the patient portal called "MyChart".  Sign up information is provided on this After Visit Summary.  MyChart is used to connect with patients for Virtual Visits (Telemedicine).  Patients are able to view lab/test results, encounter notes, upcoming appointments, etc.  Non-urgent messages can be sent to your provider as well.   To learn more about what you can do with MyChart, go to [email or web address].    Your next appointment:   As needed   The format for your next appointment:   Either In Person or Virtual  Provider:   Buford Dresser, MD

## 2019-11-14 NOTE — Progress Notes (Signed)
Cardiology Office Note:    Date:  11/14/2019   ID:  Nichole Manning, DOB Jul 19, 1965, MRN [id number]  PCP:  Remi Haggard, FNP  Cardiologist:  Buford Dresser, MD  Referring MD: Remi Haggard, FNP   CC: new patient evaluation for abnormal echo  History of Present Illness:    Nichole Manning is a 54 y.o. female with a hx of tobacco use, Covid infection 11/2018, hypothyroidism who is seen as a new consult at the request of Remi Haggard, FNP for the evaluation and management of abnormal echo.  Notes received and reviewed from [facility], dated 09/13/19 with Threasa Alpha, FNP. Echo report is brief, noted below. Only abnormality mentioned is mild LVH, mild MR. Labs sent, ESR 2, normal BMET, largely normal LFTs (total protein 6.4, borderline). Lipids Tchol 183, TG 149, HDL 87, LDL 66. Normal iron studies, normal ferritin. CRP 0.8 mg/L. TSH 6.558, T3 69, fT4 0.78. ANA screen negatie, CCP negative, rheumatoid factor negative.   Her main complaint is palpitations at night, which has been every night since she had covid in June 2020. Per PCP notes, has been tried on propranolol. Not currently taking this. Overall, she feels like she can't breathe at baseline since Covid. Has difficult wearing a mask. No energy, tired all the time. Coughing up sputum routinely.   Current smoker, since the age of 7. Has tried to quit multiple times, uses it as a stress relief. We discussed cessation today.  Denies chest pain. No PND, orthopnea, LE edema or unexpected weight gain. No syncope.  We discussed the echo report (very limited information) and used a heart model to discuss what this means. Reviewed ECG together as well. Counseled on lifestyle recommendations and risk reduction.   Past Medical History:  Diagnosis Date  . Anxiety   . Fibroids   . Hypothyroidism   . SVD (spontaneous vaginal delivery)    x 3    Past Surgical History:  Procedure Laterality Date  . BILATERAL  SALPINGECTOMY Bilateral 07/01/2013   Procedure: BILATERAL SALPINGECTOMY;  Surgeon: Lovenia Kim, MD;  Location: [facility];  Service: Gynecology;  Laterality: Bilateral;  . BREAST SURGERY     breast reduction x 2  . TUBAL LIGATION    . WISDOM TOOTH EXTRACTION      Current Medications: Current Outpatient Medications on File Prior to Visit  Medication Sig  . ALPRAZolam (XANAX) 0.5 MG tablet Take 1 tablet (0.5 mg total) by mouth at bedtime as needed for anxiety.  Marland Kitchen levothyroxine (SYNTHROID) 75 MCG tablet Take 75 mcg by mouth daily before breakfast.  . Multiple Vitamin (MULTIVITAMIN WITH MINERALS) TABS tablet Take 1 tablet by mouth daily.  Marland Kitchen spironolactone (ALDACTONE) 25 MG tablet Take 25 mg by mouth daily.  . vitamin C (ASCORBIC ACID) 500 MG tablet Take 500 mg by mouth daily.   No current facility-administered medications on file prior to visit.     Allergies:   Sulfa antibiotics, Adhesive [tape], and Codeine   Social History   Tobacco Use  . Smoking status: Current Every Day Smoker    Packs/day: 1.50    Years: 30.00    Pack years: 45.00    Types: Cigarettes  . Smokeless tobacco: Never Used  Substance Use Topics  . Alcohol use: Yes    Comment: social  . Drug use: No    Family History: family history includes Breast cancer in her mother; Cancer in her father.  ROS:   Please see the  history of present illness.  Additional pertinent ROS: Constitutional: Negative for chills, fever, night sweats, unintentional weight loss  HENT: Negative for ear pain and hearing loss.   Eyes: Negative for loss of vision and eye pain.  Respiratory: Positive for cough, sputum, no wheezing.   Cardiovascular: See HPI. Gastrointestinal: Negative for abdominal pain, melena, and hematochezia.  Genitourinary: Negative for dysuria and hematuria.  Musculoskeletal: Negative for falls and myalgias.  Skin: Negative for itching and rash.  Neurological: Negative for focal weakness, focal sensory changes  and loss of consciousness.  Endo/Heme/Allergies: Does not bruise/bleed easily.     EKGs/Labs/Other Studies Reviewed:    The following studies were reviewed today: Echo report from [facility], dated 09/13/19 (no images available) Normal chamber sizes, normal LVEF (calculated as 61%), normal wall motion. Mild LVH (IVS 1.0 cm, PW 1.5 cm), mild MR.  EKG:  EKG is personally reviewed.  The ekg ordered today demonstrates normal sinus rhythm with nonspecific T wave flattening  Recent Labs: 10/10/2019: ALT 11; BUN 9; Creatinine, Ser 0.89; Hemoglobin 14.9; Platelets 255; Potassium 4.2; Sodium 141  Recent Lipid Panel No results found for: CHOL, TRIG, HDL, CHOLHDL, VLDL, LDLCALC, LDLDIRECT  Physical Exam:    VS:  BP 122/84   Pulse 99   Ht 5' 4"  (1.626 m)   Wt 128 lb 9.6 oz (58.3 kg)   SpO2 99%   BMI 22.07 kg/m     Wt Readings from Last 3 Encounters:  11/14/19 128 lb 9.6 oz (58.3 kg)  03/11/19 128 lb (58.1 kg)  11/24/18 125 lb (56.7 kg)    GEN: Well nourished, well developed in no acute distress HEENT: Normal, moist mucous membranes NECK: No JVD CARDIAC: regular rhythm, normal S1 and S2, no rubs or gallops. No murmurs. VASCULAR: Radial and DP pulses 2+ bilaterally. No carotid bruits RESPIRATORY:  Clear to auscultation without rales, wheezing or rhonchi  ABDOMEN: Soft, non-tender, non-distended MUSCULOSKELETAL:  Ambulates independently SKIN: Warm and dry, no edema NEUROLOGIC:  Alert and oriented x 3. No focal neuro deficits noted. PSYCHIATRIC:  Normal affect    ASSESSMENT:    1. Abnormal echocardiogram   2. Palpitation   3. Encounter for tobacco use cessation counseling   4. History of COVID-19   5. Chronic fatigue   6. Shortness of breath   7. Heart palpitations    PLAN:    Abnormal echo: reason for referral. I cannot see images, but there are no major abnormalities. There is mild posterior wall LVH, but without images I cannot confirm this. Mild MR -no high  risk features -normal EF -no indication for repeat unless symptoms change  Palpitations: every night, bothersome -discussed that at night, heart can rotate slightly in the chest, making it feel more noticeable -discussed monitor to exclude arrhythmia -if Zio unremarkable, no further workup as she has already had echo  History of Covid-19 infection: has not felt herself since this. Feels fatigue, chronically short of breath -has been seen in post covid clinic 10/10/19 -reports possible strokelike episode, no residual symptoms -per chart, O2 sats >98% with walking in office  Tobacco cessation counseling: The patient was counseled on tobacco cessation today for 5 minutes.  Counseling included reviewing the risks of smoking tobacco products, how it impacts the patient's current medical diagnoses and different strategies for quitting.  Pharmacotherapy to aid in tobacco cessation was not prescribed today.  Plan for follow up: if monitor normal, can follow up as needed  Buford Dresser, MD, PhD [facility]  [facility]    Medication Adjustments/Labs and Tests Ordered: Current medicines are reviewed at length with the patient today.  Concerns regarding medicines are outlined above.  Orders Placed This Encounter  Procedures  . LONG TERM MONITOR (3-14 DAYS)  . EKG 12-Lead   No orders of the defined types were placed in this encounter.   Patient Instructions  Medication Instructions:  Your Physician recommend you continue on your current medication as directed.    *If you need a refill on your cardiac medications before your next appointment, please call your pharmacy*   Lab Work: None   Testing/Procedures: Our physician has recommended that you wear an  [place] monitor. The Zio patch cardiac monitor continuously records heart rhythm data for up to 14 days, this is for patients being evaluated for multiple types heart rhythms. For the first 24 hours post application,  please avoid getting the Zio monitor wet in the shower or by excessive sweating during exercise. After that, feel free to carry on with regular activities. Keep soaps and lotions away from the ZIO XT Patch.   Someone from our office will call to mail monitor.       Follow-Up: At [facility], you and your health needs are our priority.  As part of our continuing mission to provide you with exceptional heart care, we have created designated Provider Care Teams.  These Care Teams include your primary Cardiologist (physician) and Advanced Practice Providers (APPs -  Physician Assistants and Nurse Practitioners) who all work together to provide you with the care you need, when you need it.  We recommend signing up for the patient portal called "MyChart".  Sign up information is provided on this After Visit Summary.  MyChart is used to connect with patients for Virtual Visits (Telemedicine).  Patients are able to view lab/test results, encounter notes, upcoming appointments, etc.  Non-urgent messages can be sent to your provider as well.   To learn more about what you can do with MyChart, go to [email or web address].    Your next appointment:   As needed   The format for your next appointment:   Either In Person or Virtual  Provider:   Buford Dresser, MD      Signed, Buford Dresser, MD PhD 11/14/2019 1:09 PM    [place]

## 2019-11-16 ENCOUNTER — Ambulatory Visit: Payer: Medicaid Other | Admitting: Emergency Medicine

## 2019-11-16 ENCOUNTER — Other Ambulatory Visit: Payer: Self-pay

## 2019-11-16 ENCOUNTER — Encounter: Payer: Self-pay | Admitting: Emergency Medicine

## 2019-11-16 VITALS — BP 118/68 | HR 94 | Temp 98.4°F | Ht 65.0 in | Wt 126.8 lb

## 2019-11-16 DIAGNOSIS — R0602 Shortness of breath: Secondary | ICD-10-CM

## 2019-11-16 DIAGNOSIS — Z720 Tobacco use: Secondary | ICD-10-CM | POA: Diagnosis not present

## 2019-11-16 DIAGNOSIS — J849 Interstitial pulmonary disease, unspecified: Secondary | ICD-10-CM

## 2019-11-16 NOTE — Assessment & Plan Note (Signed)
Suspect this is large part COPD given her tobacco history.  She does have some basilar volume loss/scar on chest x-ray which could be interstitial disease related to COVID-19.  We will perform a high-resolution CT scan of the chest to investigate further.  I will perform pulmonary function testing, suspect she will have obstructive disease and with be a good candidate for schedule bronchodilator therapy.  For now we will continue the albuterol as needed.

## 2019-11-16 NOTE — Assessment & Plan Note (Signed)
Discussed cessation with her in detail today.  Strategies for decreasing discussed, medications including Chantix, Wellbutrin discussed.  For now we will cut down to 15 cigarettes daily.  Continue to cut down until we are at a point where she is ready to set a quit date.  At that time we may try Chantix.

## 2019-11-16 NOTE — Patient Instructions (Addendum)
We will arrange for full pulmonary function testing at your next office visit We will order a high-resolution CT scan of your chest to evaluate lung scarring Please keep albuterol available to use 2 puffs if/when you need it for shortness of breath, chest tightness, wheezing, coughing We agreed today that you would cut down to 15 cigarettes daily.  Work hard to maintain this. Follow with Dr. Lamonte Sakai next available with full pulmonary function testing on the same day.

## 2019-11-16 NOTE — Addendum Note (Signed)
Addended by: Gavin Potters R on: 11/16/2019 03:17 PM   Modules accepted: Orders

## 2019-11-16 NOTE — Progress Notes (Signed)
Subjective:    Patient ID: Nichole Manning, female    DOB: Jun 15, 1965, 54 y.o.   MRN: [id number]  HPI 54 year old active smoker (45 pack years), history of hypothyroidism, anxiety, hypertension.  She had COVID-19 in June 2020, was treated with .  At the time of her COVID she didn't have cough. Main sx were loss of taste and smell.  Subsequently followed up in the COVID-19 clinic with symptoms of cough, exertional dyspnea, fatigue, "brain fog" and memory loss.  She did not desaturate on exertion 10/10/2019 at their office. She is referred for cough, persistent dyspnea, suspected COPD She is smoking about 1 pk a day.    She has tried wellbutrin before, didn't   Chest x-ray 10/10/2019 reviewed by me, shows some basilar pleural thickening, possible scar, no infiltrates or effusions.  Review of Systems As above.   Past Medical History:  Diagnosis Date  . Anxiety   . Fibroids   . Hypothyroidism   . SVD (spontaneous vaginal delivery)    x 3     Family History  Problem Relation Age of Onset  . Breast cancer Mother   . Cancer Father      Social History   Socioeconomic History  . Marital status: Married    Spouse name: Not on file  . Number of children: Not on file  . Years of education: Not on file  . Highest education level: Not on file  Occupational History  . Not on file  Tobacco Use  . Smoking status: Current Every Day Smoker    Packs/day: 1.50    Years: 30.00    Pack years: 45.00    Types: Cigarettes  . Smokeless tobacco: Never Used  . Tobacco comment: 20 cigarettes per day 11/16/19 [facility]   Substance and Sexual Activity  . Alcohol use: Yes    Comment: social  . Drug use: No  . Sexual activity: Yes    Birth control/protection: Surgical  Other Topics Concern  . Not on file  Social History Narrative   Leave with son   2-story home   Left handed   2-cup cofee   Social Determinants of Health   Financial Resource Strain:   . Difficulty of Paying Living Expenses:     Food Insecurity:   . Worried About Charity fundraiser in the Last Year:   . Arboriculturist in the Last Year:   Transportation Needs:   . Film/video editor (Medical):   Marland Kitchen Lack of Transportation (Non-Medical):   Physical Activity:   . Days of Exercise per Week:   . Minutes of Exercise per Session:   Stress:   . Feeling of Stress :   Social Connections:   . Frequency of Communication with Friends and Family:   . Frequency of Social Gatherings with Friends and Family:   . Attends Religious Services:   . Active Member of Clubs or Organizations:   . Attends Archivist Meetings:   Marland Kitchen Marital Status:   Intimate Partner Violence:   . Fear of Current or Ex-Partner:   . Emotionally Abused:   Marland Kitchen Physically Abused:   . Sexually Abused:      Allergies  Allergen Reactions  . Sulfa Antibiotics Hives  . Adhesive [Tape] Itching    Rxn was from [employer].  . Codeine Nausea And Vomiting     Outpatient Medications Prior to Visit  Medication Sig Dispense Refill  . albuterol (VENTOLIN HFA) 108 (90 Base) MCG/ACT  inhaler Inhale into the lungs every 6 (six) hours as needed for wheezing or shortness of breath.    . ALPRAZolam (XANAX) 0.5 MG tablet Take 1 tablet (0.5 mg total) by mouth at bedtime as needed for anxiety. 20 tablet 0  . levothyroxine (SYNTHROID) 75 MCG tablet Take 75 mcg by mouth daily before breakfast.    . Multiple Vitamin (MULTIVITAMIN WITH MINERALS) TABS tablet Take 1 tablet by mouth daily.    . propranolol (INDERAL) 10 MG tablet Take 10 mg by mouth as needed.    Marland Kitchen spironolactone (ALDACTONE) 25 MG tablet Take 25 mg by mouth daily.    . vitamin C (ASCORBIC ACID) 500 MG tablet Take 500 mg by mouth daily.     No facility-administered medications prior to visit.        Objective:   Physical Exam Vitals:   11/16/19 1443  BP: 118/68  Pulse: 94  Temp: 98.4 F (36.9 C)  TempSrc: Oral  SpO2: 98%  Weight: 126 lb 12.8 oz (57.5 kg)  Height: 5\' 5"  (1.651 m)    Gen: Pleasant, well-nourished, in no distress,  normal affect  ENT: No lesions,  mouth clear,  oropharynx clear, no postnasal drip  Neck: No JVD, no stridor  Lungs: No use of accessory muscles, no crackles or wheezing on normal respiration, no wheeze on forced expiration  Cardiovascular: RRR, heart sounds normal, no murmur or gallops, no peripheral edema  Musculoskeletal: No deformities, no cyanosis or clubbing  Neuro: alert, awake, non focal  Skin: Warm, no lesions or rash     Assessment & Plan:  Shortness of breath Suspect this is large part COPD given her tobacco history.  She does have some basilar volume loss/scar on chest x-ray which could be interstitial disease related to COVID-19.  We will perform a high-resolution CT scan of the chest to investigate further.  I will perform pulmonary function testing, suspect she will have obstructive disease and with be a good candidate for schedule bronchodilator therapy.  For now we will continue the albuterol as needed.  Tobacco use Discussed cessation with her in detail today.  Strategies for decreasing discussed, medications including Chantix, Wellbutrin discussed.  For now we will cut down to 15 cigarettes daily.  Continue to cut down until we are at a point where she is ready to set a quit date.  At that time we may try Chantix.  Baltazar Apo, MD, PhD 11/16/2019, 3:13 PM [facility] [phone] or if no answer [phone]

## 2019-11-24 ENCOUNTER — Other Ambulatory Visit: Payer: Self-pay | Admitting: Family Medicine

## 2019-11-24 DIAGNOSIS — Z1231 Encounter for screening mammogram for malignant neoplasm of breast: Secondary | ICD-10-CM

## 2019-12-09 ENCOUNTER — Ambulatory Visit
Admission: RE | Admit: 2019-12-09 | Discharge: 2019-12-09 | Disposition: A | Discharge status: Home / Self Care | Payer: Medicaid Other | Source: Ambulatory Visit | Attending: Emergency Medicine | Admitting: Emergency Medicine

## 2019-12-09 ENCOUNTER — Ambulatory Visit
Admission: RE | Admit: 2019-12-09 | Discharge: 2019-12-09 | Disposition: A | Discharge status: Home / Self Care | Payer: Medicaid Other | Source: Ambulatory Visit | Attending: Family Medicine | Admitting: Family Medicine

## 2019-12-09 ENCOUNTER — Other Ambulatory Visit: Payer: Self-pay

## 2019-12-09 DIAGNOSIS — Z1231 Encounter for screening mammogram for malignant neoplasm of breast: Secondary | ICD-10-CM

## 2019-12-09 DIAGNOSIS — J849 Interstitial pulmonary disease, unspecified: Secondary | ICD-10-CM

## 2019-12-19 ENCOUNTER — Telehealth: Payer: Self-pay | Admitting: Cardiology

## 2019-12-19 NOTE — Telephone Encounter (Signed)
Pt mentionned that she got results from CT Scan of lungs and pulmonary doctor said that he has some concerns about her heart. Needs to speak with nurse about CT Scan

## 2019-12-19 NOTE — Telephone Encounter (Signed)
Viewed CT results ordered by pulmonologist in My Chart.  "I have clogged arteries".  She specifically asked about "Atherosclerotic calcification in the great vessels. No definite calcifications identified in the coronary arteries."  She has not spoken to pulmonology office yet about her study results. Encouraged pt to contact them. I adv that I would forward to her cardiologist to ask her to review findings related to her heart.  Adv that this does not automatically mean that she has "clogged arteries". Adv that eating a diet low in saturated fats, stop smoking and get regular aerobic exercise would be beneficial.  Has Zio monitor but has not put it on yet.  She is planning to wear it now.

## 2019-12-21 NOTE — Telephone Encounter (Signed)
I reviewed the test results. No calcium noted in the coronary arteries that feed the heart. There was some calcification seen in other vessels. This doesn't necessary mean they are "clogged," but it can be suggestive of early plaque. Best management is to quit smoking, eat a healthy diet, exercise, and keep risk factors well controlled. Biggest would be to have your primary care team follow your cholesterol periodically and make sure it is well controlled. We will be in touch when the monitor results return.

## 2019-12-21 NOTE — Telephone Encounter (Signed)
Pt updated and verbalized understanding.  

## 2019-12-30 ENCOUNTER — Other Ambulatory Visit (HOSPITAL_COMMUNITY): Payer: Medicaid Other

## 2020-01-02 ENCOUNTER — Ambulatory Visit: Payer: Medicaid Other | Admitting: Emergency Medicine

## 2020-01-03 ENCOUNTER — Ambulatory Visit (INDEPENDENT_AMBULATORY_CARE_PROVIDER_SITE_OTHER): Payer: Medicaid Other

## 2020-01-03 DIAGNOSIS — R002 Palpitations: Secondary | ICD-10-CM

## 2020-01-30 ENCOUNTER — Ambulatory Visit: Payer: Self-pay | Admitting: Emergency Medicine

## 2020-02-06 ENCOUNTER — Telehealth: Payer: Self-pay | Admitting: Cardiology

## 2020-02-06 NOTE — Telephone Encounter (Signed)
Spoke with the patient and let her know that Dr. Harrell Gave has not yet reviewed her monitor results but as soon as she does we will give her a call.

## 2020-02-06 NOTE — Telephone Encounter (Signed)
Follow up:     patient calling concering her results from last month. Please call patient.

## 2020-02-14 NOTE — Progress Notes (Deleted)
NEUROLOGY FOLLOW UP OFFICE NOTE  Nichole Manning [id number]  HISTORY OF PRESENT ILLNESS: Nichole Manning is a 54 year old left-handed white woman who follows up for new concern, specifically "brain fog" and memory problems.  UPDATE: Last seen in October 2020 for possible stroke that occurred the previous June.  I had ordered an MRI of the brain but patient didn't have it performed and did not follow up.  She continues to have various symptoms such as fatigue, chronic cough, shortness of breath and palpitations.  She had an echocardiogram which showed normal EF with possible mild posterior wall LVH and mild MR>  She was seen by cardiology who ordered Zio patch for cardiac monitoring which ***.  She also reports ***.     HISTORY: In June 2020, she was diagnosed with COVID, presenting with congestion, runny nose and loss of taste and smell.  At the same time, she was going through a divorce, which has caused anxiety.  She then developed a "fluid sensation" in both ears and across her forehead.  She also noted bilateral ringing in both of her ears.  On 11/24/2018, she woke up with vertigo, described as the room spinning.  She got out of bed and fell.  She started to pray out loud and noted that she had slurred speech and spoke jibberish.  No facial droop or unilateral numbness or weakness.  She called 911 and EMS arrived.  She was seen in [facility] and told she may have had a COVID-related TIA but no imaging was performed because she was COVID positive.  Symptoms had resolved by the time she got to [facility] (about an hour).  She later went to the ED at [facility] where her neurologic exam was normal.  CT head and CTA of head and neck were unremarkable.  She was diagnosed with probable peripheral vertigo.  She reports that she still feels like she is not speaking right.  She still notes inability to taste, smell, has constant tinnitus and has trouble sleeping.    PAST MEDICAL HISTORY: Past Medical History:   Diagnosis Date  . Anxiety   . Fibroids   . Hypothyroidism   . SVD (spontaneous vaginal delivery)    x 3    MEDICATIONS: Current Outpatient Medications on File Prior to Visit  Medication Sig Dispense Refill  . albuterol (VENTOLIN HFA) 108 (90 Base) MCG/ACT inhaler Inhale into the lungs every 6 (six) hours as needed for wheezing or shortness of breath.    . ALPRAZolam (XANAX) 0.5 MG tablet Take 1 tablet (0.5 mg total) by mouth at bedtime as needed for anxiety. 20 tablet 0  . levothyroxine (SYNTHROID) 75 MCG tablet Take 75 mcg by mouth daily before breakfast.    . Multiple Vitamin (MULTIVITAMIN WITH MINERALS) TABS tablet Take 1 tablet by mouth daily.    . propranolol (INDERAL) 10 MG tablet Take 10 mg by mouth as needed.    Marland Kitchen spironolactone (ALDACTONE) 25 MG tablet Take 25 mg by mouth daily.    . vitamin C (ASCORBIC ACID) 500 MG tablet Take 500 mg by mouth daily.     No current facility-administered medications on file prior to visit.    ALLERGIES: Allergies  Allergen Reactions  . Sulfa Antibiotics Hives  . Adhesive [Tape] Itching    Rxn was from [facility].  . Codeine Nausea And Vomiting    FAMILY HISTORY: Family History  Problem Relation Age of Onset  . Breast cancer Mother   .  Cancer Father    ***.  SOCIAL HISTORY: Social History   Socioeconomic History  . Marital status: Married    Spouse name: Not on file  . Number of children: Not on file  . Years of education: Not on file  . Highest education level: Not on file  Occupational History  . Not on file  Tobacco Use  . Smoking status: Current Every Day Smoker    Packs/day: 1.50    Years: 30.00    Pack years: 45.00    Types: Cigarettes  . Smokeless tobacco: Never Used  . Tobacco comment: 20 cigarettes per day 11/16/19 [facility]   Substance and Sexual Activity  . Alcohol use: Yes    Comment: social  . Drug use: No  . Sexual activity: Yes    Birth control/protection: Surgical  Other Topics Concern  . Not on file    Social History Narrative   Leave with son   2-story home   Left handed   2-cup cofee   Social Determinants of Health   Financial Resource Strain:   . Difficulty of Paying Living Expenses: Not on file  Food Insecurity:   . Worried About Charity fundraiser in the Last Year: Not on file  . Ran Out of Food in the Last Year: Not on file  Transportation Needs:   . Lack of Transportation (Medical): Not on file  . Lack of Transportation (Non-Medical): Not on file  Physical Activity:   . Days of Exercise per Week: Not on file  . Minutes of Exercise per Session: Not on file  Stress:   . Feeling of Stress : Not on file  Social Connections:   . Frequency of Communication with Friends and Family: Not on file  . Frequency of Social Gatherings with Friends and Family: Not on file  . Attends Religious Services: Not on file  . Active Member of Clubs or Organizations: Not on file  . Attends Archivist Meetings: Not on file  . Marital Status: Not on file  Intimate Partner Violence:   . Fear of Current or Ex-Partner: Not on file  . Emotionally Abused: Not on file  . Physically Abused: Not on file  . Sexually Abused: Not on file    REVIEW OF SYSTEMS: Constitutional: No fevers, chills, or sweats, no generalized fatigue, change in appetite Eyes: No visual changes, double vision, eye pain Ear, nose and throat: No hearing loss, ear pain, nasal congestion, sore throat Cardiovascular: No chest pain, palpitations Respiratory:  No shortness of breath at rest or with exertion, wheezes GastrointestinaI: No nausea, vomiting, diarrhea, abdominal pain, fecal incontinence Genitourinary:  No dysuria, urinary retention or frequency Musculoskeletal:  No neck pain, back pain Integumentary: No rash, pruritus, skin lesions Neurological: as above Psychiatric: No depression, insomnia, anxiety Endocrine: No palpitations, fatigue, diaphoresis, mood swings, change in appetite, change in weight,  increased thirst Hematologic/Lymphatic:  No purpura, petechiae. Allergic/Immunologic: no itchy/runny eyes, nasal congestion, recent allergic reactions, rashes  PHYSICAL EXAM: *** General: No acute distress.  Patient appears ***-groomed.   Head:  Normocephalic/atraumatic Eyes:  Fundi examined but not visualized Neck: supple, no paraspinal tenderness, full range of motion Heart:  Regular rate and rhythm Lungs:  Clear to auscultation bilaterally Back: No paraspinal tenderness Neurological Exam: alert and oriented to person, place, and time. Attention span and concentration intact, recent and remote memory intact, fund of knowledge intact.  Speech fluent and not dysarthric, language intact.  CN II-XII intact. Bulk and tone normal, muscle  strength 5/5 throughout.  Sensation to light touch, temperature and vibration intact.  Deep tendon reflexes 2+ throughout, toes downgoing.  Finger to nose and heel to shin testing intact.  Gait normal, Romberg negative.  IMPRESSION: ***  PLAN: ***  Metta Clines, DO  CC: ***

## 2020-02-16 ENCOUNTER — Ambulatory Visit: Payer: Medicaid Other | Admitting: Neurology

## 2020-03-02 ENCOUNTER — Ambulatory Visit: Payer: Self-pay | Admitting: Emergency Medicine

## 2020-03-05 NOTE — Telephone Encounter (Signed)
Result note sent, we will contact her with results.

## 2020-03-06 ENCOUNTER — Telehealth: Payer: Self-pay | Admitting: Cardiology

## 2020-03-06 NOTE — Telephone Encounter (Signed)
Pt updated with monitor and verbalized understanding.

## 2020-03-06 NOTE — Telephone Encounter (Signed)
Left detailed message with results.  Ok per [employer]. Advised to call back if any questions.

## 2020-03-06 NOTE — Telephone Encounter (Signed)
Nichole Manning is returning Nichole Manning's call in regards to her monitor results.

## 2020-03-21 ENCOUNTER — Ambulatory Visit: Payer: Self-pay | Admitting: Emergency Medicine

## 2020-07-20 ENCOUNTER — Other Ambulatory Visit: Payer: Self-pay | Admitting: Adult Health

## 2020-07-20 DIAGNOSIS — N6310 Unspecified lump in the right breast, unspecified quadrant: Secondary | ICD-10-CM

## 2020-08-01 ENCOUNTER — Other Ambulatory Visit: Payer: Self-pay | Admitting: Adult Health

## 2020-08-01 DIAGNOSIS — Z803 Family history of malignant neoplasm of breast: Secondary | ICD-10-CM

## 2020-08-01 DIAGNOSIS — N6310 Unspecified lump in the right breast, unspecified quadrant: Secondary | ICD-10-CM

## 2020-08-10 ENCOUNTER — Ambulatory Visit
Admission: RE | Admit: 2020-08-10 | Discharge: 2020-08-10 | Disposition: A | Discharge status: Home / Self Care | Payer: Medicaid Other | Source: Ambulatory Visit | Attending: Adult Health | Admitting: Adult Health

## 2020-08-10 ENCOUNTER — Other Ambulatory Visit: Payer: Self-pay

## 2020-08-10 ENCOUNTER — Other Ambulatory Visit: Payer: Self-pay | Admitting: Adult Health

## 2020-08-10 DIAGNOSIS — N6310 Unspecified lump in the right breast, unspecified quadrant: Secondary | ICD-10-CM

## 2020-08-10 DIAGNOSIS — Z803 Family history of malignant neoplasm of breast: Secondary | ICD-10-CM

## 2020-09-08 ENCOUNTER — Observation Stay (HOSPITAL_COMMUNITY)
Admission: EM | Admit: 2020-09-08 | Discharge: 2020-09-10 | Disposition: A | Discharge status: Home / Self Care | Payer: Medicaid Other | Attending: Physician Assistant | Admitting: Physician Assistant

## 2020-09-08 ENCOUNTER — Other Ambulatory Visit: Payer: Self-pay

## 2020-09-08 DIAGNOSIS — K37 Unspecified appendicitis: Secondary | ICD-10-CM | POA: Diagnosis present

## 2020-09-08 DIAGNOSIS — Z20822 Contact with and (suspected) exposure to covid-19: Secondary | ICD-10-CM | POA: Diagnosis not present

## 2020-09-08 DIAGNOSIS — Z79899 Other long term (current) drug therapy: Secondary | ICD-10-CM | POA: Insufficient documentation

## 2020-09-08 DIAGNOSIS — F17210 Nicotine dependence, cigarettes, uncomplicated: Secondary | ICD-10-CM | POA: Insufficient documentation

## 2020-09-08 DIAGNOSIS — Z8616 Personal history of COVID-19: Secondary | ICD-10-CM | POA: Diagnosis not present

## 2020-09-08 DIAGNOSIS — K3580 Unspecified acute appendicitis: Secondary | ICD-10-CM | POA: Diagnosis present

## 2020-09-08 DIAGNOSIS — K353 Acute appendicitis with localized peritonitis, without perforation or gangrene: Secondary | ICD-10-CM | POA: Diagnosis not present

## 2020-09-08 DIAGNOSIS — R1032 Left lower quadrant pain: Secondary | ICD-10-CM | POA: Diagnosis present

## 2020-09-08 DIAGNOSIS — E039 Hypothyroidism, unspecified: Secondary | ICD-10-CM | POA: Diagnosis not present

## 2020-09-08 HISTORY — DX: Palpitations: R00.2

## 2020-09-08 HISTORY — DX: Chronic obstructive pulmonary disease, unspecified: J44.9

## 2020-09-08 LAB — COMPREHENSIVE METABOLIC PANEL
ALT: 16 U/L (ref 0–44)
AST: 18 U/L (ref 15–41)
Albumin: 4.1 g/dL (ref 3.5–5.0)
Alkaline Phosphatase: 67 U/L (ref 38–126)
Anion gap: 10 (ref 5–15)
BUN: 13 mg/dL (ref 6–20)
CO2: 26 mmol/L (ref 22–32)
Calcium: 9 mg/dL (ref 8.9–10.3)
Chloride: 103 mmol/L (ref 98–111)
Creatinine, Ser: 0.97 mg/dL (ref 0.44–1.00)
GFR, Estimated: >60 mL/min (ref >60)
Glucose, Bld: 89 mg/dL (ref 70–99)
Potassium: 3.7 mmol/L (ref 3.5–5.1)
Sodium: 139 mmol/L (ref 135–145)
Total Bilirubin: 0.6 mg/dL (ref 0.3–1.2)
Total Protein: 6.5 g/dL (ref 6.5–8.1)

## 2020-09-08 LAB — URINALYSIS, ROUTINE W REFLEX MICROSCOPIC
Bilirubin Urine: NEGATIVE
Glucose, UA: NEGATIVE mg/dL
Hgb urine dipstick: NEGATIVE
Ketones, ur: 40 mg/dL — AB
Leukocytes,Ua: NEGATIVE
Nitrite: NEGATIVE
Protein, ur: NEGATIVE mg/dL
Specific Gravity, Urine: 1.02 (ref 1.005–1.030)
pH: 7.5 (ref 5.0–8.0)

## 2020-09-08 LAB — CBC WITH DIFFERENTIAL/PLATELET
Abs Immature Granulocytes: 0.05 10*3/uL (ref 0.00–0.07)
Basophils Absolute: 0.1 10*3/uL (ref 0.0–0.1)
Basophils Relative: 1 %
Eosinophils Absolute: 0 10*3/uL (ref 0.0–0.5)
Eosinophils Relative: 0 %
HCT: 44.2 % (ref 36.0–46.0)
Hemoglobin: 14.8 g/dL (ref 12.0–15.0)
Immature Granulocytes: 0 %
Lymphocytes Relative: 18 %
Lymphs Abs: 2.4 10*3/uL (ref 0.7–4.0)
MCH: 33.4 pg (ref 26.0–34.0)
MCHC: 33.5 g/dL (ref 30.0–36.0)
MCV: 99.8 fL (ref 80.0–100.0)
Monocytes Absolute: 0.6 10*3/uL (ref 0.1–1.0)
Monocytes Relative: 5 %
Neutro Abs: 10 10*3/uL — ABNORMAL HIGH (ref 1.7–7.7)
Neutrophils Relative %: 76 %
Platelets: 248 10*3/uL (ref 150–400)
RBC: 4.43 MIL/uL (ref 3.87–5.11)
RDW: 12.5 % (ref 11.5–15.5)
WBC: 13.2 10*3/uL — ABNORMAL HIGH (ref 4.0–10.5)
nRBC: 0 % (ref 0.0–0.2)

## 2020-09-08 LAB — LIPASE, BLOOD: Lipase: 37 U/L (ref 11–51)

## 2020-09-08 NOTE — ED Provider Notes (Signed)
MSE was initiated and I personally evaluated the patient and placed orders (if any) at  9:58 PM on September 08, 2020.  The patient appears stable so that the remainder of the MSE may be completed by another provider.  Patient here with complaints of pain to left side abdomen that started earlier today.  Pain is been persistent, moderate in severity with associated nausea vomiting and having some loose stools.  No hematemesis and no hematochezia.  Does take NSAIDs on a regular basis.  Has not had a colonoscopy in the past.  No prior history of diverticular disease.  No complaint of chest pain.  On exam she appears uncomfortable.  Heart and lung sounds normal.  She does have tenderness to her left upper and left lower abdomen without guarding or rebound tenderness.   Domenic Moras, PA-C 09/08/20 2200    Arnaldo Natal, MD 09/09/20 [phone]

## 2020-09-08 NOTE — ED Triage Notes (Signed)
Left sided abdominal pain with nausea, dry heaving and diarrhea that's been going on and off for months but started back again today.

## 2020-09-09 ENCOUNTER — Observation Stay (HOSPITAL_COMMUNITY): Payer: Medicaid Other | Admitting: Certified Registered Nurse Anesthetist

## 2020-09-09 ENCOUNTER — Encounter (HOSPITAL_COMMUNITY): Payer: Self-pay | Admitting: Certified Registered Nurse Anesthetist

## 2020-09-09 ENCOUNTER — Encounter (HOSPITAL_COMMUNITY)
Admission: EM | Disposition: A | Discharge status: Home / Self Care | Payer: Self-pay | Source: Home / Self Care | Attending: Emergency Medicine

## 2020-09-09 ENCOUNTER — Emergency Department (HOSPITAL_COMMUNITY): Payer: Medicaid Other

## 2020-09-09 DIAGNOSIS — K37 Unspecified appendicitis: Secondary | ICD-10-CM | POA: Diagnosis present

## 2020-09-09 DIAGNOSIS — K3580 Unspecified acute appendicitis: Secondary | ICD-10-CM | POA: Diagnosis present

## 2020-09-09 DIAGNOSIS — Z20822 Contact with and (suspected) exposure to covid-19: Secondary | ICD-10-CM | POA: Diagnosis not present

## 2020-09-09 DIAGNOSIS — E039 Hypothyroidism, unspecified: Secondary | ICD-10-CM | POA: Diagnosis not present

## 2020-09-09 DIAGNOSIS — K353 Acute appendicitis with localized peritonitis, without perforation or gangrene: Secondary | ICD-10-CM | POA: Diagnosis not present

## 2020-09-09 DIAGNOSIS — F17210 Nicotine dependence, cigarettes, uncomplicated: Secondary | ICD-10-CM | POA: Diagnosis not present

## 2020-09-09 HISTORY — PX: LAPAROSCOPIC APPENDECTOMY: SHX408

## 2020-09-09 LAB — CBC
HCT: 41.4 % (ref 36.0–46.0)
HCT: 38.3 % (ref 36.0–46.0)
Hemoglobin: 13.7 g/dL (ref 12.0–15.0)
Hemoglobin: 12.7 g/dL (ref 12.0–15.0)
MCH: 32.7 pg (ref 26.0–34.0)
MCH: 33.3 pg (ref 26.0–34.0)
MCHC: 33.1 g/dL (ref 30.0–36.0)
MCHC: 33.2 g/dL (ref 30.0–36.0)
MCV: 98.8 fL (ref 80.0–100.0)
MCV: 100.5 fL — ABNORMAL HIGH (ref 80.0–100.0)
Platelets: 182 10*3/uL (ref 150–400)
Platelets: 182 10*3/uL (ref 150–400)
RBC: 4.19 MIL/uL (ref 3.87–5.11)
RBC: 3.81 MIL/uL — ABNORMAL LOW (ref 3.87–5.11)
RDW: 12.6 % (ref 11.5–15.5)
RDW: 12.6 % (ref 11.5–15.5)
WBC: 9.8 10*3/uL (ref 4.0–10.5)
WBC: 12.6 10*3/uL — ABNORMAL HIGH (ref 4.0–10.5)
nRBC: 0 % (ref 0.0–0.2)
nRBC: 0 % (ref 0.0–0.2)

## 2020-09-09 LAB — RESP PANEL BY RT-PCR (FLU A&B, COVID) ARPGX2
Influenza A by PCR: NEGATIVE
Influenza B by PCR: NEGATIVE
SARS Coronavirus 2 by RT PCR: NEGATIVE

## 2020-09-09 LAB — LACTIC ACID, PLASMA: Lactic Acid, Venous: 0.9 mmol/L (ref 0.5–1.9)

## 2020-09-09 LAB — HIV ANTIBODY (ROUTINE TESTING W REFLEX): HIV Screen 4th Generation wRfx: NONREACTIVE

## 2020-09-09 LAB — CREATININE, SERUM
Creatinine, Ser: 0.9 mg/dL (ref 0.44–1.00)
Creatinine, Ser: 0.85 mg/dL (ref 0.44–1.00)
GFR, Estimated: >60 mL/min (ref >60)
GFR, Estimated: >60 mL/min (ref >60)

## 2020-09-09 SURGERY — APPENDECTOMY, LAPAROSCOPIC
Anesthesia: General | Site: Abdomen

## 2020-09-09 MED ORDER — ONDANSETRON HCL 4 MG/2ML IJ SOLN: 4.0000 mg | Freq: Four times a day (QID) | INTRAMUSCULAR | Status: DC | PRN

## 2020-09-09 MED ORDER — GABAPENTIN 600 MG PO TABS
300.0000 mg | ORAL_TABLET | Freq: Three times a day (TID) | ORAL | Status: DC
  Administered 2020-09-09 – 2020-09-10 (×3): 300 mg via ORAL
  Filled 2020-09-09: qty 1
  Filled 2020-09-09: qty 0.5
  Filled 2020-09-09 (×2): qty 1

## 2020-09-09 MED ORDER — FENTANYL CITRATE (PF) 250 MCG/5ML IJ SOLN
INTRAMUSCULAR | Status: AC
  Filled 2020-09-09: qty 5

## 2020-09-09 MED ORDER — 0.9 % SODIUM CHLORIDE (POUR BTL) OPTIME
TOPICAL | Status: DC | PRN
  Administered 2020-09-09: 1000 mL

## 2020-09-09 MED ORDER — METOPROLOL TARTRATE 5 MG/5ML IV SOLN: 5.0000 mg | Freq: Four times a day (QID) | INTRAVENOUS | Status: DC | PRN

## 2020-09-09 MED ORDER — PROPOFOL 10 MG/ML IV BOLUS
INTRAVENOUS | Status: AC
  Filled 2020-09-09: qty 20

## 2020-09-09 MED ORDER — LACTATED RINGERS IV SOLN: INTRAVENOUS | Status: DC | PRN

## 2020-09-09 MED ORDER — CHLORHEXIDINE GLUCONATE 0.12 % MT SOLN: 15.0000 mL | Freq: Once | OROMUCOSAL | Status: AC

## 2020-09-09 MED ORDER — ONDANSETRON HCL 4 MG/2ML IJ SOLN
INTRAMUSCULAR | Status: AC
  Filled 2020-09-09: qty 2

## 2020-09-09 MED ORDER — ALBUMIN HUMAN 5 % IV SOLN: INTRAVENOUS | Status: DC | PRN

## 2020-09-09 MED ORDER — ROCURONIUM BROMIDE 10 MG/ML (PF) SYRINGE
PREFILLED_SYRINGE | INTRAVENOUS | Status: AC
  Filled 2020-09-09: qty 10

## 2020-09-09 MED ORDER — CHLORHEXIDINE GLUCONATE 0.12 % MT SOLN
OROMUCOSAL | Status: AC
  Administered 2020-09-09: 15 mL via OROMUCOSAL
  Filled 2020-09-09: qty 15

## 2020-09-09 MED ORDER — PHENYLEPHRINE 40 MCG/ML (10ML) SYRINGE FOR IV PUSH (FOR BLOOD PRESSURE SUPPORT)
PREFILLED_SYRINGE | INTRAVENOUS | Status: DC | PRN
  Administered 2020-09-09: 80 ug via INTRAVENOUS
  Administered 2020-09-09: 120 ug via INTRAVENOUS
  Administered 2020-09-09: 80 ug via INTRAVENOUS
  Administered 2020-09-09: 120 ug via INTRAVENOUS

## 2020-09-09 MED ORDER — ORAL CARE MOUTH RINSE: 15.0000 mL | Freq: Once | OROMUCOSAL | Status: AC

## 2020-09-09 MED ORDER — DEXAMETHASONE SODIUM PHOSPHATE 10 MG/ML IJ SOLN
INTRAMUSCULAR | Status: AC
  Filled 2020-09-09: qty 1

## 2020-09-09 MED ORDER — FENTANYL CITRATE (PF) 100 MCG/2ML IJ SOLN
50.0000 ug | Freq: Once | INTRAMUSCULAR | Status: AC
  Administered 2020-09-09: 50 ug via INTRAVENOUS
  Filled 2020-09-09: qty 2

## 2020-09-09 MED ORDER — PROPOFOL 10 MG/ML IV BOLUS
INTRAVENOUS | Status: DC | PRN
  Administered 2020-09-09: 150 mg via INTRAVENOUS

## 2020-09-09 MED ORDER — LIDOCAINE 2% (20 MG/ML) 5 ML SYRINGE
INTRAMUSCULAR | Status: AC
  Filled 2020-09-09: qty 5

## 2020-09-09 MED ORDER — KETOROLAC TROMETHAMINE 30 MG/ML IJ SOLN
INTRAMUSCULAR | Status: AC
  Filled 2020-09-09: qty 1

## 2020-09-09 MED ORDER — IOPAMIDOL (ISOVUE-300) INJECTION 61%
100.0000 mL | Freq: Once | INTRAVENOUS | Status: AC | PRN
  Administered 2020-09-09: 100 mL via INTRAVENOUS

## 2020-09-09 MED ORDER — ENOXAPARIN SODIUM 40 MG/0.4ML ~~LOC~~ SOLN
40.0000 mg | SUBCUTANEOUS | Status: DC
  Administered 2020-09-09 – 2020-09-10 (×2): 40 mg via SUBCUTANEOUS
  Filled 2020-09-09 (×2): qty 0.4

## 2020-09-09 MED ORDER — BUPIVACAINE-EPINEPHRINE 0.25% -1:200000 IJ SOLN
INTRAMUSCULAR | Status: DC | PRN
  Administered 2020-09-09: 30 mL

## 2020-09-09 MED ORDER — MIDAZOLAM HCL 2 MG/2ML IJ SOLN
INTRAMUSCULAR | Status: AC
  Filled 2020-09-09: qty 2

## 2020-09-09 MED ORDER — ALBUTEROL SULFATE HFA 108 (90 BASE) MCG/ACT IN AERS
1.0000 | INHALATION_SPRAY | Freq: Four times a day (QID) | RESPIRATORY_TRACT | Status: DC | PRN
  Filled 2020-09-09: qty 6.7

## 2020-09-09 MED ORDER — OXYCODONE HCL 5 MG PO TABS: 5.0000 mg | ORAL_TABLET | Freq: Once | ORAL | Status: DC | PRN

## 2020-09-09 MED ORDER — FENTANYL CITRATE (PF) 100 MCG/2ML IJ SOLN
INTRAMUSCULAR | Status: DC | PRN
  Administered 2020-09-09 (×2): 50 ug via INTRAVENOUS
  Administered 2020-09-09: 100 ug via INTRAVENOUS

## 2020-09-09 MED ORDER — PHENYLEPHRINE 40 MCG/ML (10ML) SYRINGE FOR IV PUSH (FOR BLOOD PRESSURE SUPPORT)
PREFILLED_SYRINGE | INTRAVENOUS | Status: AC
  Filled 2020-09-09: qty 10

## 2020-09-09 MED ORDER — SUGAMMADEX SODIUM 200 MG/2ML IV SOLN
INTRAVENOUS | Status: DC | PRN
  Administered 2020-09-09: 200 mg via INTRAVENOUS

## 2020-09-09 MED ORDER — RIMEGEPANT SULFATE 75 MG PO TBDP: 1.0000 | ORAL_TABLET | ORAL | Status: DC | PRN

## 2020-09-09 MED ORDER — ALPRAZOLAM 0.5 MG PO TABS
0.5000 mg | ORAL_TABLET | Freq: Every evening | ORAL | Status: DC | PRN
  Administered 2020-09-09: 0.5 mg via ORAL
  Filled 2020-09-09: qty 1

## 2020-09-09 MED ORDER — METRONIDAZOLE IN NACL 5-0.79 MG/ML-% IV SOLN
500.0000 mg | Freq: Once | INTRAVENOUS | Status: AC
  Administered 2020-09-09: 500 mg via INTRAVENOUS
  Filled 2020-09-09: qty 100

## 2020-09-09 MED ORDER — MORPHINE SULFATE (PF) 2 MG/ML IV SOLN: 2.0000 mg | INTRAVENOUS | Status: DC | PRN

## 2020-09-09 MED ORDER — SODIUM CHLORIDE 0.9 % IV SOLN
2.0000 g | INTRAVENOUS | Status: DC
  Administered 2020-09-10: 2 g via INTRAVENOUS
  Filled 2020-09-09: qty 2

## 2020-09-09 MED ORDER — KETOROLAC TROMETHAMINE 30 MG/ML IJ SOLN
30.0000 mg | Freq: Four times a day (QID) | INTRAMUSCULAR | Status: DC | PRN
  Administered 2020-09-09: 30 mg via INTRAVENOUS
  Filled 2020-09-09 (×2): qty 1

## 2020-09-09 MED ORDER — ROCURONIUM BROMIDE 10 MG/ML (PF) SYRINGE
PREFILLED_SYRINGE | INTRAVENOUS | Status: DC | PRN
  Administered 2020-09-09: 50 mg via INTRAVENOUS

## 2020-09-09 MED ORDER — SPIRONOLACTONE 25 MG PO TABS
25.0000 mg | ORAL_TABLET | Freq: Every day | ORAL | Status: DC
  Administered 2020-09-10: 25 mg via ORAL
  Filled 2020-09-09 (×2): qty 1

## 2020-09-09 MED ORDER — KETOROLAC TROMETHAMINE 30 MG/ML IJ SOLN
INTRAMUSCULAR | Status: DC | PRN
  Administered 2020-09-09: 30 mg via INTRAVENOUS

## 2020-09-09 MED ORDER — LACTATED RINGERS IV SOLN: INTRAVENOUS | Status: DC

## 2020-09-09 MED ORDER — SODIUM CHLORIDE 0.9 % IV BOLUS
1000.0000 mL | Freq: Once | INTRAVENOUS | Status: AC
  Administered 2020-09-09: 1000 mL via INTRAVENOUS

## 2020-09-09 MED ORDER — PHENYLEPHRINE HCL-NACL 10-0.9 MG/250ML-% IV SOLN
INTRAVENOUS | Status: DC | PRN
  Administered 2020-09-09: 25 ug/min via INTRAVENOUS

## 2020-09-09 MED ORDER — PROCHLORPERAZINE EDISYLATE 10 MG/2ML IJ SOLN
5.0000 mg | Freq: Four times a day (QID) | INTRAMUSCULAR | Status: DC | PRN
  Administered 2020-09-09: 5 mg via INTRAVENOUS
  Filled 2020-09-09: qty 2

## 2020-09-09 MED ORDER — LEVOTHYROXINE SODIUM 88 MCG PO TABS
88.0000 ug | ORAL_TABLET | Freq: Every morning | ORAL | Status: DC
  Administered 2020-09-10: 88 ug via ORAL
  Filled 2020-09-09: qty 1

## 2020-09-09 MED ORDER — METRONIDAZOLE IN NACL 5-0.79 MG/ML-% IV SOLN
500.0000 mg | Freq: Three times a day (TID) | INTRAVENOUS | Status: DC
  Administered 2020-09-09 – 2020-09-10 (×3): 500 mg via INTRAVENOUS
  Filled 2020-09-09 (×3): qty 100

## 2020-09-09 MED ORDER — DIPHENHYDRAMINE HCL 12.5 MG/5ML PO ELIX: 12.5000 mg | ORAL_SOLUTION | Freq: Four times a day (QID) | ORAL | Status: DC | PRN

## 2020-09-09 MED ORDER — OXYCODONE HCL 5 MG/5ML PO SOLN: 5.0000 mg | Freq: Once | ORAL | Status: DC | PRN

## 2020-09-09 MED ORDER — LIDOCAINE 2% (20 MG/ML) 5 ML SYRINGE
INTRAMUSCULAR | Status: DC | PRN
  Administered 2020-09-09: 60 mg via INTRAVENOUS

## 2020-09-09 MED ORDER — HYDROMORPHONE HCL 1 MG/ML IJ SOLN
0.5000 mg | INTRAMUSCULAR | Status: DC | PRN
  Administered 2020-09-09: 0.5 mg via INTRAVENOUS
  Filled 2020-09-09: qty 1

## 2020-09-09 MED ORDER — ONDANSETRON HCL 4 MG/2ML IJ SOLN
4.0000 mg | Freq: Four times a day (QID) | INTRAMUSCULAR | Status: DC | PRN
Start: 2020-09-09 — End: 2020-09-09

## 2020-09-09 MED ORDER — ONDANSETRON HCL 4 MG/2ML IJ SOLN
INTRAMUSCULAR | Status: DC | PRN
  Administered 2020-09-09: 4 mg via INTRAVENOUS

## 2020-09-09 MED ORDER — SODIUM CHLORIDE 0.9 % IV SOLN
2.0000 g | Freq: Once | INTRAVENOUS | Status: AC
  Administered 2020-09-09: 2 g via INTRAVENOUS
  Filled 2020-09-09: qty 20

## 2020-09-09 MED ORDER — DIPHENHYDRAMINE HCL 50 MG/ML IJ SOLN: 12.5000 mg | Freq: Four times a day (QID) | INTRAMUSCULAR | Status: DC | PRN

## 2020-09-09 MED ORDER — ONDANSETRON HCL 4 MG/2ML IJ SOLN
4.0000 mg | Freq: Once | INTRAMUSCULAR | Status: AC
  Administered 2020-09-09: 4 mg via INTRAVENOUS
  Filled 2020-09-09: qty 2

## 2020-09-09 MED ORDER — SODIUM CHLORIDE 0.9 % IR SOLN
Status: DC | PRN
  Administered 2020-09-09: 1000 mL

## 2020-09-09 MED ORDER — DEXAMETHASONE SODIUM PHOSPHATE 10 MG/ML IJ SOLN
INTRAMUSCULAR | Status: DC | PRN
  Administered 2020-09-09: 10 mg via INTRAVENOUS

## 2020-09-09 MED ORDER — FENTANYL CITRATE (PF) 100 MCG/2ML IJ SOLN
INTRAMUSCULAR | Status: AC
  Filled 2020-09-09: qty 2

## 2020-09-09 MED ORDER — FENTANYL CITRATE (PF) 100 MCG/2ML IJ SOLN: 25.0000 ug | INTRAMUSCULAR | Status: DC | PRN

## 2020-09-09 MED ORDER — PROCHLORPERAZINE MALEATE 10 MG PO TABS
10.0000 mg | ORAL_TABLET | Freq: Four times a day (QID) | ORAL | Status: DC | PRN
  Filled 2020-09-09: qty 1

## 2020-09-09 MED ORDER — OXYCODONE HCL 5 MG PO TABS
5.0000 mg | ORAL_TABLET | ORAL | Status: DC | PRN
  Administered 2020-09-09: 5 mg via ORAL

## 2020-09-09 MED ORDER — PROPRANOLOL HCL 20 MG PO TABS: 10.0000 mg | ORAL_TABLET | ORAL | Status: DC | PRN

## 2020-09-09 MED ORDER — ACETAMINOPHEN 325 MG PO TABS
650.0000 mg | ORAL_TABLET | Freq: Four times a day (QID) | ORAL | Status: DC
  Administered 2020-09-09 – 2020-09-10 (×5): 650 mg via ORAL
  Filled 2020-09-09 (×6): qty 2

## 2020-09-09 MED ORDER — ONDANSETRON 4 MG PO TBDP: 4.0000 mg | ORAL_TABLET | Freq: Four times a day (QID) | ORAL | Status: DC | PRN

## 2020-09-09 MED ORDER — OXYCODONE HCL 5 MG PO TABS
10.0000 mg | ORAL_TABLET | ORAL | Status: DC | PRN
Start: 2020-09-09 — End: 2020-09-10
  Filled 2020-09-09: qty 2

## 2020-09-09 MED ORDER — BUPIVACAINE-EPINEPHRINE (PF) 0.25% -1:200000 IJ SOLN
INTRAMUSCULAR | Status: AC
  Filled 2020-09-09: qty 30

## 2020-09-09 MED ORDER — TRAMADOL HCL 50 MG PO TABS
50.0000 mg | ORAL_TABLET | Freq: Four times a day (QID) | ORAL | Status: DC | PRN
Start: 2020-09-09 — End: 2020-09-10

## 2020-09-09 MED ORDER — DEXTROSE-NACL 5-0.45 % IV SOLN: INTRAVENOUS | Status: DC

## 2020-09-09 SURGICAL SUPPLY — 37 items
ADH SKN CLS APL DERMABOND .7 (GAUZE/BANDAGES/DRESSINGS) ×1
APL PRP STRL LF DISP 70% ISPRP (MISCELLANEOUS) ×1
BAG SPEC RTRVL 10 TROC 200 (ENDOMECHANICALS) ×1
CANISTER SUCT 3000ML PPV (MISCELLANEOUS) ×2 IMPLANT
CHLORAPREP W/TINT 26 (MISCELLANEOUS) ×2 IMPLANT
CLIP VESOLOCK MED LG 6/CT (CLIP) ×1 IMPLANT
COVER SURGICAL LIGHT HANDLE (MISCELLANEOUS) ×2 IMPLANT
CUTTER FLEX LINEAR 45M (STAPLE) ×1 IMPLANT
DERMABOND ADVANCED (GAUZE/BANDAGES/DRESSINGS) ×1
DERMABOND ADVANCED .7 DNX12 (GAUZE/BANDAGES/DRESSINGS) ×1 IMPLANT
ELECT REM PT RETURN 9FT ADLT (ELECTROSURGICAL) ×2
ELECTRODE REM PT RTRN 9FT ADLT (ELECTROSURGICAL) ×1 IMPLANT
GLOVE SURG SS PI 7.0 STRL IVOR (GLOVE) ×2 IMPLANT
GLOVE SURG UNDER POLY LF SZ7 (GLOVE) ×2 IMPLANT
GOWN STRL REUS W/ TWL LRG LVL3 (GOWN DISPOSABLE) ×3 IMPLANT
GOWN STRL REUS W/TWL LRG LVL3 (GOWN DISPOSABLE) ×4
GRASPER SUT TROCAR 14GX15 (MISCELLANEOUS) ×2 IMPLANT
KIT BASIN OR (CUSTOM PROCEDURE TRAY) ×2 IMPLANT
KIT TURNOVER KIT B (KITS) ×2 IMPLANT
NEEDLE 22X1 1/2 (OR ONLY) (NEEDLE) ×2 IMPLANT
NS IRRIG 1000ML POUR BTL (IV SOLUTION) ×2 IMPLANT
PAD ARMBOARD 7.5X6 YLW CONV (MISCELLANEOUS) ×4 IMPLANT
POUCH RETRIEVAL ECOSAC 10 (ENDOMECHANICALS) ×1 IMPLANT
POUCH RETRIEVAL ECOSAC 10MM (ENDOMECHANICALS) ×2
RELOAD STAPLE 45 3.5 BLU ETS (ENDOMECHANICALS) IMPLANT
RELOAD STAPLE TA45 3.5 REG BLU (ENDOMECHANICALS) ×2 IMPLANT
SCISSORS LAP 5X35 DISP (ENDOMECHANICALS) ×2 IMPLANT
SET IRRIG TUBING LAPAROSCOPIC (IRRIGATION / IRRIGATOR) ×2 IMPLANT
SET TUBE SMOKE EVAC HIGH FLOW (TUBING) ×2 IMPLANT
SLEEVE ENDOPATH XCEL 5M (ENDOMECHANICALS) ×2 IMPLANT
SPECIMEN JAR SMALL (MISCELLANEOUS) ×2 IMPLANT
SUT MNCRL AB 4-0 PS2 18 (SUTURE) ×2 IMPLANT
TOWEL GREEN STERILE FF (TOWEL DISPOSABLE) ×2 IMPLANT
TRAY LAPAROSCOPIC MC (CUSTOM PROCEDURE TRAY) ×2 IMPLANT
TROCAR XCEL 12X100 BLDLESS (ENDOMECHANICALS) ×2 IMPLANT
TROCAR XCEL NON-BLD 5MMX100MML (ENDOMECHANICALS) ×2 IMPLANT
WATER STERILE IRR 1000ML POUR (IV SOLUTION) ×2 IMPLANT

## 2020-09-09 NOTE — Transfer of Care (Signed)
Immediate Anesthesia Transfer of Care Note  Patient: Nichole Manning  Procedure(s) Performed: APPENDECTOMY LAPAROSCOPIC (N/A Abdomen)  Patient Location: PACU  Anesthesia Type:General  Level of Consciousness: drowsy  Airway & Oxygen Therapy: Patient Spontanous Breathing  Post-op Assessment: Report given to RN and Post -op Vital signs reviewed and stable  Post vital signs: Reviewed and stable  Last Vitals:  Vitals Value Taken Time  BP 109/75 09/09/20 1151  Temp    Pulse 108 09/09/20 1152  Resp 17 09/09/20 1152  SpO2 96 % 09/09/20 1152  Vitals shown include unvalidated device data.  Last Pain:  Vitals:   09/09/20 0937  TempSrc:   PainSc: 5          Complications: No complications documented.

## 2020-09-09 NOTE — ED Notes (Signed)
ED Provider at bedside. 

## 2020-09-09 NOTE — Anesthesia Postprocedure Evaluation (Signed)
Anesthesia Post Note  Patient: AFRIKA BRICK  Procedure(s) Performed: APPENDECTOMY LAPAROSCOPIC (N/A Abdomen)     Patient location during evaluation: PACU Anesthesia Type: General Level of consciousness: awake and alert Pain management: pain level controlled Vital Signs Assessment: post-procedure vital signs reviewed and stable Respiratory status: spontaneous breathing, nonlabored ventilation, respiratory function stable and patient connected to nasal cannula oxygen Cardiovascular status: blood pressure returned to baseline and stable Postop Assessment: no apparent nausea or vomiting Anesthetic complications: no   No complications documented.  Last Vitals:  Vitals:   09/09/20 1230 09/09/20 1245  BP: 112/74 101/74  Pulse: 79 80  Resp: 13 18  Temp: 36.6 C 36.6 C  SpO2: 100% 100%    Last Pain:  Vitals:   09/09/20 1230  TempSrc:   PainSc: 0-No pain                 Deshauna Cayson S

## 2020-09-09 NOTE — H&P (Signed)
Admitting Physician: [name]  Service: General surgery  CC: abdominal pain   Subjective   HPI: Nichole Manning is an 55 y.o. female who is here for abdominal pain.  Over the last few months she has had episodes of abdominal.  The pain is usually in the left lower quadrant.  These episodes have come and gone.  They are associated with nausea and bloating.  She usually feels completely better between the episodes.  She presented to the ER because over the last few days she has had a very bad episode.  She says the pain feels like to the left lower quadrant, however when she landed someone pushes on her abdomen the right lower quadrant hurts.  Past Medical History:  Diagnosis Date  . Anxiety   . Fibroids   . Hypothyroidism   . SVD (spontaneous vaginal delivery)    x 3    Past Surgical History:  Procedure Laterality Date  . AUGMENTATION MAMMAPLASTY    . BILATERAL SALPINGECTOMY Bilateral 07/01/2013   Procedure: BILATERAL SALPINGECTOMY;  Surgeon: Lovenia Kim, MD;  Location: [facility];  Service: Gynecology;  Laterality: Bilateral;  . BREAST SURGERY     breast reduction x 2  . TUBAL LIGATION    . WISDOM TOOTH EXTRACTION      Family History  Problem Relation Age of Onset  . Breast cancer Mother   . Cancer Father     Social:  reports that she has been smoking cigarettes. She has a 45.00 pack-year smoking history. She has never used smokeless tobacco. She reports current alcohol use. She reports that she does not use drugs.  Allergies:  Allergies  Allergen Reactions  . Sulfa Antibiotics Hives  . Adhesive [Tape] Itching    Rxn was from [facility].  . Codeine Nausea And Vomiting    Medications: Current Outpatient Medications  Medication Instructions  . albuterol (VENTOLIN HFA) 108 (90 Base) MCG/ACT inhaler Inhalation, Every 6 hours PRN  . ALPRAZolam (XANAX) 0.5 mg, Oral, At bedtime PRN  . BEVESPI AEROSPHERE 9-4.8 MCG/ACT AERO 2 puffs, Inhalation, 2 times daily   . Calcium Citrate 250 MG TABS 1 tablet, Oral, Daily  . Clindamycin-Benzoyl Per, Refr, gel 1 application, Topical, Daily  . cyanocobalamin ((VITAMIN B-12)) 1,000 mcg, Intramuscular, Every 28 days  . levothyroxine (SYNTHROID) 88 mcg, Oral, Every morning  . Multiple Vitamin (MULTIVITAMIN WITH MINERALS) TABS tablet 1 tablet, Oral, Daily  . NURTEC 75 MG TBDP 1 tablet, Oral, Every 48 hours PRN  . propranolol (INDERAL) 10 mg, Oral, As needed  . spironolactone (ALDACTONE) 25 mg, Oral, Daily  . vitamin C (ASCORBIC ACID) 500 mg, Oral, Daily    ROS - all of the below systems have been reviewed with the patient and positives are indicated with bold text General: chills, fever or night sweats Eyes: blurry vision or double vision ENT: epistaxis or sore throat Allergy/Immunology: itchy/watery eyes or nasal congestion Hematologic/Lymphatic: bleeding problems, blood clots or swollen lymph nodes Endocrine: temperature intolerance or unexpected weight changes Breast: new or changing breast lumps or nipple discharge Resp: cough, shortness of breath, or wheezing CV: chest pain or dyspnea on exertion GI: as per HPI GU: dysuria, trouble voiding, or hematuria MSK: joint pain or joint stiffness Neuro: TIA or stroke symptoms Derm: pruritus and skin lesion changes Psych: anxiety and depression  Objective   PE Blood pressure (!) 114/97, pulse 75, temperature 99 F (37.2 C), temperature source Oral, resp. rate 18, height 5\' 5"  (1.651 m),  weight 57.5 kg, SpO2 98 %. Constitutional: NAD; conversant; no deformities Eyes: Moist conjunctiva; no lid lag; anicteric; PERRL Neck: Trachea midline; no thyromegaly Lungs: Normal respiratory effort; no tactile fremitus CV: RRR; no palpable thrills; no pitting edema GI: Abd soft, only slightly distended, tenderness over McBurney's point with deep palpation.  Positive obturator sign.; no palpable hepatosplenomegaly MSK: Normal range of motion of extremities; no  clubbing/cyanosis Psychiatric: Appropriate affect; alert and oriented x3 Lymphatic: No palpable cervical or axillary lymphadenopathy  Results for orders placed or performed during the hospital encounter of 09/08/20 (from the past 24 hour(s))  Urinalysis, Routine w reflex microscopic Urine, Clean Catch     Status: Abnormal   Collection Time: 09/08/20  9:59 PM  Result Value Ref Range   Color, Urine YELLOW YELLOW   APPearance CLEAR CLEAR   Specific Gravity, Urine 1.020 1.005 - 1.030   pH 7.5 5.0 - 8.0   Glucose, UA NEGATIVE NEGATIVE mg/dL   Hgb urine dipstick NEGATIVE NEGATIVE   Bilirubin Urine NEGATIVE NEGATIVE   Ketones, ur 40 (A) NEGATIVE mg/dL   Protein, ur NEGATIVE NEGATIVE mg/dL   Nitrite NEGATIVE NEGATIVE   Leukocytes,Ua NEGATIVE NEGATIVE  CBC with Differential     Status: Abnormal   Collection Time: 09/08/20 10:06 PM  Result Value Ref Range   WBC 13.2 (H) 4.0 - 10.5 K/uL   RBC 4.43 3.87 - 5.11 MIL/uL   Hemoglobin 14.8 12.0 - 15.0 g/dL   HCT 44.2 36.0 - 46.0 %   MCV 99.8 80.0 - 100.0 fL   MCH 33.4 26.0 - 34.0 pg   MCHC 33.5 30.0 - 36.0 g/dL   RDW 12.5 11.5 - 15.5 %   Platelets 248 150 - 400 K/uL   nRBC 0.0 0.0 - 0.2 %   Neutrophils Relative % 76 %   Neutro Abs 10.0 (H) 1.7 - 7.7 K/uL   Lymphocytes Relative 18 %   Lymphs Abs 2.4 0.7 - 4.0 K/uL   Monocytes Relative 5 %   Monocytes Absolute 0.6 0.1 - 1.0 K/uL   Eosinophils Relative 0 %   Eosinophils Absolute 0.0 0.0 - 0.5 K/uL   Basophils Relative 1 %   Basophils Absolute 0.1 0.0 - 0.1 K/uL   Immature Granulocytes 0 %   Abs Immature Granulocytes 0.05 0.00 - 0.07 K/uL  Comprehensive metabolic panel     Status: None   Collection Time: 09/08/20 10:06 PM  Result Value Ref Range   Sodium 139 135 - 145 mmol/L   Potassium 3.7 3.5 - 5.1 mmol/L   Chloride 103 98 - 111 mmol/L   CO2 26 22 - 32 mmol/L   Glucose, Bld 89 70 - 99 mg/dL   BUN 13 6 - 20 mg/dL   Creatinine, Ser 0.97 0.44 - 1.00 mg/dL   Calcium 9.0 8.9 - 10.3  mg/dL   Total Protein 6.5 6.5 - 8.1 g/dL   Albumin 4.1 3.5 - 5.0 g/dL   AST 18 15 - 41 U/L   ALT 16 0 - 44 U/L   Alkaline Phosphatase 67 38 - 126 U/L   Total Bilirubin 0.6 0.3 - 1.2 mg/dL   GFR, Estimated >60 >60 mL/min   Anion gap 10 5 - 15  Lipase, blood     Status: None   Collection Time: 09/08/20 10:06 PM  Result Value Ref Range   Lipase 37 11 - 51 U/L  Lactic acid, plasma     Status: None   Collection Time: 09/09/20  2:48 AM  Result Value Ref Range   Lactic Acid, Venous 0.9 0.5 - 1.9 mmol/L  Resp Panel by RT-PCR (Flu A&B, Covid) Nasopharyngeal Swab     Status: None   Collection Time: 09/09/20  4:18 AM   Specimen: Nasopharyngeal Swab; Nasopharyngeal(NP) swabs in vial transport medium  Result Value Ref Range   SARS Coronavirus 2 by RT PCR NEGATIVE NEGATIVE   Influenza A by PCR NEGATIVE NEGATIVE   Influenza B by PCR NEGATIVE NEGATIVE     Imaging Orders     CT ABDOMEN PELVIS W CONTRAST   Assessment and Plan   Nichole Manning is an 55 y.o. female with abdominal pain.  It appears she is having an atypical presentation for appendicitis.  The fact that she has been having pain for months is atypical, but her CT scan physical exam and lab work is consistent with appendicitis.  I discussed the patient with my partner Dr. Kieth Brightly who is coming on call for today.  We would recommend a laparoscopic appendectomy for the patient.  The procedure itself as well as its risk, benefits, and alternatives were discussed and the the patient has granted consent consent to proceed we will proceed to the operating room as the OR schedule allows.   Felicie Morn, MD  [facility] Use [email or web address] to contact on call provider

## 2020-09-09 NOTE — Op Note (Signed)
Preoperative diagnosis: acute appendicitis  Postoperative diagnosis: Same   Procedure: laparoscopic appendectomy  Surgeon: Gurney Maxin, M.D.  Asst: none  Anesthesia: Gen.   Indications for procedure: Nichole Manning is a 55 y.o. female with symptoms of pain in right lower quadrant and nausea consistent with acute appendicitis. Confirmed by CT.  Description of procedure: The patient was brought into the operative suite, placed supine. Anesthesia was administered with endotracheal tube. The patient's left arm was tucked. All pressure points were offloaded by foam padding. The patient was prepped and draped in the usual sterile fashion.  A transverse incision was made to the left of the umbilicus and a 55mm trocar was [place]. Pneumoperitoneum was applied with high flow low pressure.  2 54mm trocars were placed, one in the suprapubic space, one in the LLQ, the periumbilical incision was then up-sized and a 52mm trocar placed in that space. A transversus abdominal block was placed on the left and right sides. Next, the patient was placed in trendelenberg, rotated to the left. The omentum was retracted cephalad. The cecum and appendix were identified. The mid body and tip of appendix were inflamed without signs of perforation. The base of the appendix was dissected and a window through the mesoappendix was created with blunt dissection. A 26mm blue load stapler was used to cut the appendix at its base. Next a 0 PDS endo loop was placed across the mesoappendix and the mesoappendix was cut with cautery.  The appendix was placed in a specimen bag. The pelvis and RLQ were irrigated. The gallbladder was normal, colon was grossly normal without diverticulosis. The pelvis showed 2 areas of ovary like tissue and a scar from hysterectomy without adhesive disease. The small intestine was normal. The appendix was removed via the 12 mm trocar. Pneumoperitoneum was removed, all trocars were removed. All incisions were  closed with 4-0 monocryl subcuticular stitch. The patient woke from anesthesia and was brought to PACU in stable condition.  Findings: acute appendicitis  Specimen: appendix  Blood loss: 20 ml  Local anesthesia: 30 ml Marcaine  Complications: none  Images:  Left ovary  Hysterectomy scar  Right ovary  Acute appendicitis  Sigmoid colon  gallbladder  Gurney Maxin, M.D. General, Bariatric, & Minimally Invasive Surgery [facility]

## 2020-09-09 NOTE — ED Notes (Signed)
Patient to CT.

## 2020-09-09 NOTE — ED Provider Notes (Signed)
[facility] EMERGENCY DEPARTMENT Provider Note   CSN: [id number] Arrival date & time: 09/08/20  2150     History Chief Complaint  Patient presents with  . Abdominal Pain  . Nausea  . Emesis    Nichole Manning is a 55 y.o. female.  Patient with history of hypothyroidism presenting with abdominal pain for the past 1 day and is progressively worsening.  Most of the pain is in the left side of her abdomen but she is sore all over.  Has had nausea and dry heaving but no vomiting.  Multiple episodes of loose nonbloody stools.  Think that she may have had a fever at home but has not checked.  No pain with urination or blood in the urine.  Patient had several episodes of this pain in the past but never had it evaluated.  This is the worst episode she has had.  No abdominal surgeries other than hysterectomy. No vaginal bleeding or discharge.  The history is provided by the patient.  Abdominal Pain Associated symptoms: diarrhea, nausea and vomiting   Associated symptoms: no dysuria, no fever and no hematuria   Emesis Associated symptoms: abdominal pain and diarrhea   Associated symptoms: no arthralgias, no fever, no headaches and no myalgias        Past Medical History:  Diagnosis Date  . Anxiety   . Fibroids   . Hypothyroidism   . SVD (spontaneous vaginal delivery)    x 3    Patient Active Problem List   Diagnosis Date Noted  . Tobacco use 11/16/2019  . Chronic fatigue 10/10/2019  . Chronic cough 10/10/2019  . Anxiety and depression 10/10/2019  . History of COVID-19 10/10/2019  . Shortness of breath 10/10/2019  . Brain fog 10/10/2019  . Memory loss 10/10/2019  . Heart palpitations 10/10/2019  . Fibroids 07/01/2013  . HYPOTHYROIDISM 07/25/2008  . CONSTIPATION 07/25/2008  . HEADACHE 07/25/2008    Past Surgical History:  Procedure Laterality Date  . AUGMENTATION MAMMAPLASTY    . BILATERAL SALPINGECTOMY Bilateral 07/01/2013   Procedure: BILATERAL  SALPINGECTOMY;  Surgeon: Lovenia Kim, MD;  Location: [facility];  Service: Gynecology;  Laterality: Bilateral;  . BREAST SURGERY     breast reduction x 2  . TUBAL LIGATION    . WISDOM TOOTH EXTRACTION       OB History   No obstetric history on file.     Family History  Problem Relation Age of Onset  . Breast cancer Mother   . Cancer Father     Social History   Tobacco Use  . Smoking status: Current Every Day Smoker    Packs/day: 1.50    Years: 30.00    Pack years: 45.00    Types: Cigarettes  . Smokeless tobacco: Never Used  . Tobacco comment: 20 cigarettes per day 11/16/19 [facility]   Substance Use Topics  . Alcohol use: Yes    Comment: social  . Drug use: No    Home Medications Prior to Admission medications   Medication Sig Start Date End Date Taking? Authorizing Provider  albuterol (VENTOLIN HFA) 108 (90 Base) MCG/ACT inhaler Inhale into the lungs every 6 (six) hours as needed for wheezing or shortness of breath.    [provider]  ALPRAZolam Duanne Moron) 0.5 MG tablet Take 1 tablet (0.5 mg total) by mouth at bedtime as needed for anxiety. 07/02/13   Brien Few, MD  levothyroxine (SYNTHROID) 75 MCG tablet Take 75 mcg by mouth daily before breakfast.  [provider]  Multiple Vitamin (MULTIVITAMIN WITH MINERALS) TABS tablet Take 1 tablet by mouth daily.    [provider]  propranolol (INDERAL) 10 MG tablet Take 10 mg by mouth as needed.    [provider]  spironolactone (ALDACTONE) 25 MG tablet Take 25 mg by mouth daily. 10/28/18   [provider]  vitamin C (ASCORBIC ACID) 500 MG tablet Take 500 mg by mouth daily.    [provider]    Allergies    Sulfa antibiotics, Adhesive [tape], and Codeine  Review of Systems   Review of Systems  Constitutional: Positive for activity change and appetite change. Negative for fever.  HENT: Negative for congestion and rhinorrhea.   Gastrointestinal: Positive for abdominal  pain, diarrhea, nausea and vomiting.  Genitourinary: Negative for dysuria and hematuria.  Musculoskeletal: Negative for arthralgias and myalgias.  Neurological: Negative for dizziness, weakness and headaches.   all other systems are negative except as noted in the HPI and PMH.   Physical Exam Updated Vital Signs BP 111/84 (BP Location: Right Arm)   Pulse 75   Temp 99 F (37.2 C) (Oral)   Resp 19   Ht 5\' 5"  (1.651 m)   Wt 57.5 kg   SpO2 97%   BMI 21.09 kg/m   Physical Exam Vitals and nursing note reviewed.  Constitutional:      General: She is not in acute distress.    Appearance: She is well-developed.     Comments: Feels warm  HENT:     Head: Normocephalic and atraumatic.     Mouth/Throat:     Pharynx: No oropharyngeal exudate.  Eyes:     Conjunctiva/sclera: Conjunctivae normal.     Pupils: Pupils are equal, round, and reactive to light.  Neck:     Comments: No meningismus. Cardiovascular:     Rate and Rhythm: Normal rate and regular rhythm.     Heart sounds: Normal heart sounds. No murmur heard.   Pulmonary:     Effort: Pulmonary effort is normal. No respiratory distress.     Breath sounds: Normal breath sounds.  Abdominal:     Palpations: Abdomen is soft.     Tenderness: There is abdominal tenderness. There is guarding. There is no rebound.     Comments: Diffuse tenderness worse in the left lower quadrant and right lower quadrant.  Voluntary guarding present.  No rebound  Musculoskeletal:        General: No tenderness. Normal range of motion.     Cervical back: Normal range of motion and neck supple.     Comments: No CVA tenderness  Skin:    General: Skin is warm.  Neurological:     Mental Status: She is alert and oriented to person, place, and time.     Cranial Nerves: No cranial nerve deficit.     Motor: No abnormal muscle tone.     Coordination: Coordination normal.     Comments:  5/5 strength throughout. CN 2-12 intact.Equal grip strength.    Psychiatric:        Behavior: Behavior normal.     ED Results / Procedures / Treatments   Labs (all labs ordered are listed, but only abnormal results are displayed) Labs Reviewed  CBC WITH DIFFERENTIAL/PLATELET - Abnormal; Notable for the following components:      Result Value   WBC 13.2 (*)    Neutro Abs 10.0 (*)    All other components within normal limits  URINALYSIS, ROUTINE W REFLEX MICROSCOPIC -  Abnormal; Notable for the following components:   Ketones, ur 40 (*)    All other components within normal limits  RESP PANEL BY RT-PCR (FLU A&B, COVID) ARPGX2  COMPREHENSIVE METABOLIC PANEL  LIPASE, BLOOD  LACTIC ACID, PLASMA    EKG None  Radiology CT ABDOMEN PELVIS W CONTRAST  Addendum Date: 09/09/2020   ADDENDUM REPORT: 09/09/2020 04:26 ADDENDUM: Study discussed by telephone with Dr. Wyvonnia Dusky in the ED on 09/09/2020 at 0415 hours. The appendix is identified arising from the cecum near coronal image 44 and tracking posteriorly along the right pelvic side wall. On coronal image 55 and series 3, image 63 there is an 8 mm appendicoliths, and distal to this the appendix is fluid-filled and dilated up to 8 mm diameter (series 3, image 64 and coronal image 65. There is perhaps trace peri-appendiceal inflammatory stranding at the tip. CONCLUSION: Appendicolith and suspected Early Acute Appendicitis. Recommend Surgery Consultation. If the clinical course is equivocal then repeat CT Abdomen and Pelvis is recommended in 12-24 hours. Electronically Signed   By: Genevie Ann M.D.   On: 09/09/2020 04:26   Result Date: 09/09/2020 CLINICAL DATA:  Abdominal EXAM: CT ABDOMEN AND PELVIS WITH CONTRAST TECHNIQUE: Multidetector CT imaging of the abdomen and pelvis was performed using the standard protocol following bolus administration of intravenous contrast. CONTRAST:  166mL ISOVUE-300 IOPAMIDOL (ISOVUE-300) INJECTION 61% COMPARISON:  None. FINDINGS: Lower chest: Lung bases are clear. No effusions. Heart is  normal size. Hepatobiliary: No focal hepatic abnormality. Gallbladder unremarkable. Pancreas: No focal abnormality or ductal dilatation. Spleen: No focal abnormality.  Normal size. Adrenals/Urinary Tract: No adrenal abnormality. No focal renal abnormality. No stones or hydronephrosis. Urinary bladder is unremarkable. Stomach/Bowel: Stomach, large and small bowel grossly unremarkable. Vascular/Lymphatic: Aortic atherosclerosis. No evidence of aneurysm or adenopathy. Reproductive: Prior hysterectomy.  No adnexal masses. Other: No free fluid or free air. Musculoskeletal: No acute bony abnormality. IMPRESSION: No acute findings in the abdomen or pelvis. Aortic atherosclerosis. Electronically Signed: By: Rolm Baptise M.D. On: 09/09/2020 03:38    Procedures Procedures   Medications Ordered in ED Medications  sodium chloride 0.9 % bolus 1,000 mL (has no administration in time range)  fentaNYL (SUBLIMAZE) injection 50 mcg (has no administration in time range)  ondansetron (ZOFRAN) injection 4 mg (has no administration in time range)    ED Course  I have reviewed the triage vital signs and the nursing notes.  Pertinent labs & imaging results that were available during my care of the patient were reviewed by me and considered in my medical decision making (see chart for details).    MDM Rules/Calculators/A&P                         Abdominal pain with nausea and diarrhea.  Abdomen is soft without peritoneal signs.  She does have tenderness both in the right and left sides.  Labs show leukocytosis.  Will need imaging to evaluate for pathology including diverticulitis and appendicitis  CT was initially read as negative. Appendix not identified.  Given patient's ongoing pain this was discussed with radiology Dr. Nevada Crane.  He did identify patient's appendix along the right pelvic gutter with appendicolith and dilated appearance.  Concern for likely early appendicitis.  Will initiate  antibiotics.  Discussed with general surgery Dr. Thermon Leyland who will evaluate patient Final Clinical Impression(s) / ED Diagnoses Final diagnoses:  Acute appendicitis, unspecified acute appendicitis type    Rx / DC Orders ED Discharge Orders  None       Ezequiel Essex, MD 09/09/20 [phone]

## 2020-09-09 NOTE — Anesthesia Procedure Notes (Signed)
Procedure Name: Intubation Date/Time: 09/09/2020 10:50 AM Performed by: Candis Shine, CRNA Pre-anesthesia Checklist: Patient identified, Emergency Drugs available, Suction available and Patient being monitored Patient Re-evaluated:Patient Re-evaluated prior to induction Oxygen Delivery Method: Circle System Utilized Preoxygenation: Pre-oxygenation with 100% oxygen Induction Type: IV induction Ventilation: Mask ventilation without difficulty Laryngoscope Size: Mac and 3 Grade View: Grade II Tube type: Oral Tube size: 7.0 mm Number of attempts: 1 Airway Equipment and Method: Stylet Placement Confirmation: ETT inserted through vocal cords under direct vision,  positive ETCO2 and breath sounds checked- equal and bilateral Secured at: 22 cm Tube secured with: Tape Dental Injury: Teeth and Oropharynx as per pre-operative assessment

## 2020-09-09 NOTE — Anesthesia Preprocedure Evaluation (Signed)
Anesthesia Evaluation  Patient identified by MRN, date of birth, ID band Patient awake    Reviewed: Allergy & Precautions, H&P , NPO status , Patient's Chart, lab work & pertinent test results  Airway Mallampati: II   Neck ROM: full    Dental   Pulmonary Current Smoker,    breath sounds clear to auscultation       Cardiovascular negative cardio ROS   Rhythm:regular Rate:Normal     Neuro/Psych  Headaches, PSYCHIATRIC DISORDERS Anxiety Depression    GI/Hepatic appendicitis   Endo/Other  Hypothyroidism   Renal/GU      Musculoskeletal   Abdominal   Peds  Hematology   Anesthesia Other Findings   Reproductive/Obstetrics                             Anesthesia Physical Anesthesia Plan  ASA: II  Anesthesia Plan: General   Post-op Pain Management:    Induction: Intravenous  PONV Risk Score and Plan: 2 and Ondansetron, Dexamethasone, Midazolam and Treatment may vary due to age or medical condition  Airway Management Planned: Oral ETT  Additional Equipment:   Intra-op Plan:   Post-operative Plan: Extubation in OR  Informed Consent: I have reviewed the patients History and Physical, chart, labs and discussed the procedure including the risks, benefits and alternatives for the proposed anesthesia with the patient or authorized representative who has indicated his/her understanding and acceptance.     Dental advisory given  Plan Discussed with: CRNA, Anesthesiologist and Surgeon  Anesthesia Plan Comments:         Anesthesia Quick Evaluation

## 2020-09-10 ENCOUNTER — Encounter (HOSPITAL_COMMUNITY): Payer: Self-pay | Admitting: General Surgery

## 2020-09-10 LAB — CBC
HCT: 35 % — ABNORMAL LOW (ref 36.0–46.0)
Hemoglobin: 11.8 g/dL — ABNORMAL LOW (ref 12.0–15.0)
MCH: 33.8 pg (ref 26.0–34.0)
MCHC: 33.7 g/dL (ref 30.0–36.0)
MCV: 100.3 fL — ABNORMAL HIGH (ref 80.0–100.0)
Platelets: 190 10*3/uL (ref 150–400)
RBC: 3.49 MIL/uL — ABNORMAL LOW (ref 3.87–5.11)
RDW: 12.4 % (ref 11.5–15.5)
WBC: 10.1 10*3/uL (ref 4.0–10.5)
nRBC: 0 % (ref 0.0–0.2)

## 2020-09-10 LAB — BASIC METABOLIC PANEL
Anion gap: 7 (ref 5–15)
BUN: 11 mg/dL (ref 6–20)
CO2: 25 mmol/L (ref 22–32)
Calcium: 8.8 mg/dL — ABNORMAL LOW (ref 8.9–10.3)
Chloride: 107 mmol/L (ref 98–111)
Creatinine, Ser: 0.89 mg/dL (ref 0.44–1.00)
GFR, Estimated: >60 mL/min (ref >60)
Glucose, Bld: 133 mg/dL — ABNORMAL HIGH (ref 70–99)
Potassium: 4.1 mmol/L (ref 3.5–5.1)
Sodium: 139 mmol/L (ref 135–145)

## 2020-09-10 MED ORDER — OXYCODONE HCL 5 MG PO TABS: 5.0000 mg | ORAL_TABLET | Freq: Four times a day (QID) | ORAL | 0 refills | Status: DC | PRN

## 2020-09-10 MED ORDER — ACETAMINOPHEN 325 MG PO TABS: 650.0000 mg | ORAL_TABLET | Freq: Four times a day (QID) | ORAL | Status: DC | PRN

## 2020-09-10 MED ORDER — TRAMADOL HCL 50 MG PO TABS: 50.0000 mg | ORAL_TABLET | Freq: Four times a day (QID) | ORAL | 0 refills | Status: DC | PRN

## 2020-09-10 NOTE — Discharge Instructions (Signed)
[facility] LAPAROSCOPIC SURGERY: POST OP INSTRUCTIONS Always review your discharge instruction sheet given to you by the facility where your surgery was performed. IF YOU HAVE DISABILITY OR FAMILY LEAVE FORMS, YOU MUST BRING THEM TO THE OFFICE FOR PROCESSING.   DO NOT GIVE THEM TO YOUR DOCTOR.  PAIN CONTROL  1. First take acetaminophen (Tylenol) AND/or ibuprofen (Advil) to control your pain after surgery.  Follow directions on package.  Taking acetaminophen (Tylenol) and/or ibuprofen (Advil) regularly after surgery will help to control your pain and lower the amount of prescription pain medication you may need.  You should not take more than 3,000 mg (3 grams) of acetaminophen (Tylenol) in 24 hours.  You should not take ibuprofen (Advil), aleve, motrin, naprosyn or other NSAIDS if you have a history of stomach ulcers or chronic kidney disease.  2. A prescription for pain medication may be given to you upon discharge.  Take your pain medication as prescribed, if you still have uncontrolled pain after taking acetaminophen (Tylenol) or ibuprofen (Advil). 3. Use ice packs to help control pain. 4. If you need a refill on your pain medication, please contact your pharmacy.  They will contact our office to request authorization. Prescriptions will not be filled after 5pm or on week-ends.  HOME MEDICATIONS 5. Take your usually prescribed medications unless otherwise directed.  DIET 6. You should follow a light diet the first few days after arrival home.  Be sure to include lots of fluids daily. Avoid fatty, fried foods.   CONSTIPATION 7. It is common to experience some constipation after surgery and if you are taking pain medication.  Increasing fluid intake and taking a stool softener (such as Colace) will usually help or prevent this problem from occurring.  A mild laxative (Milk of Magnesia or Miralax) should be taken according to package instructions if there are no bowel  movements after 48 hours.  WOUND/INCISION CARE 8. Most patients will experience some swelling and bruising in the area of the incisions.  Ice packs will help.  Swelling and bruising can take several days to resolve.  9. Unless discharge instructions indicate otherwise, follow guidelines below  a. STERI-STRIPS - you may remove your outer bandages 48 hours after surgery, and you may shower at that time.  You have steri-strips (small skin tapes) in place directly over the incision.  These strips should be left on the skin for 7-10 days.   b. DERMABOND/SKIN GLUE - you may shower in 24 hours.  The glue will flake off over the next 2-3 weeks. 10. Any sutures or staples will be removed at the office during your follow-up visit.  ACTIVITIES 11. You may resume regular (light) daily activities beginning the next day--such as daily self-care, walking, climbing stairs--gradually increasing activities as tolerated.  You may have sexual intercourse when it is comfortable.  Refrain from any heavy lifting or straining until approved by your doctor. a. You may drive when you are no longer taking prescription pain medication, you can comfortably wear a seatbelt, and you can safely maneuver your car and apply brakes.  FOLLOW-UP 12. You should see your doctor in the office for a follow-up appointment approximately 2-3 weeks after your surgery.  You should have been given your post-op/follow-up appointment when your surgery was scheduled.  If you did not receive a post-op/follow-up appointment, make sure that you call for this appointment within a day or two after you arrive home to insure a convenient appointment time.     WHEN TO CALL YOUR DOCTOR: 1. Fever over 101.0 2. Inability to urinate 3. Continued bleeding from incision. 4. Increased pain, redness, or drainage from the incision. 5. Increasing abdominal pain  The clinic staff is available to answer your questions during regular business hours.  Please don't  hesitate to call and ask to speak to one of the nurses for clinical concerns.  If you have a medical emergency, go to the nearest emergency room or call 911.  A surgeon from [facility] is always on call at the hospital. [address], [address], [place], [place]  [address] ? [address] [address], [place],    [address] [phone] ? [phone] ? FAX [phone] Web site: [email or web address]  .........   Managing Your Pain After Surgery Without Opioids    Thank you for participating in our program to help patients manage their pain after surgery without opioids. This is part of our effort to provide you with the best care possible, without exposing you or your family to the risk that opioids pose.  What pain can I expect after surgery? You can expect to have some pain after surgery. This is normal. The pain is typically worse the day after surgery, and quickly begins to get better. Many studies have found that many patients are able to manage their pain after surgery with Over-the-Counter (OTC) medications such as Tylenol and Motrin. If you have a condition that does not allow you to take Tylenol or Motrin, notify your surgical team.  How will I manage my pain? The best strategy for controlling your pain after surgery is around the clock pain control with Tylenol (acetaminophen) and Motrin (ibuprofen or Advil). Alternating these medications with each other allows you to maximize your pain control. In addition to Tylenol and Motrin, you can use heating pads or ice packs on your incisions to help reduce your pain.  How will I alternate your regular strength over-the-counter pain medication? You will take a dose of pain medication every three hours. ; Start by taking 650 mg of Tylenol (2 pills of 325 mg) ; 3 hours later take 600 mg of Motrin (3 pills of 200 mg) ; 3 hours after taking the Motrin take 650 mg of Tylenol ; 3 hours after that take 600 mg of  Motrin.   - 1 -  See example - if your first dose of Tylenol is at 12:00 PM   12:00 PM Tylenol 650 mg (2 pills of 325 mg)  3:00 PM Motrin 600 mg (3 pills of 200 mg)  6:00 PM Tylenol 650 mg (2 pills of 325 mg)  9:00 PM Motrin 600 mg (3 pills of 200 mg)  Continue alternating every 3 hours   We recommend that you follow this schedule around-the-clock for at least 3 days after surgery, or until you feel that it is no longer needed. Use the table on the last page of this handout to keep track of the medications you are taking. Important: Do not take more than 3000mg of Tylenol or 3200mg of Motrin in a 24-hour period. Do not take ibuprofen/Motrin if you have a history of bleeding stomach ulcers, severe kidney disease, &/or actively taking a blood thinner  What if I still have pain? If you have pain that is not controlled with the over-the-counter pain medications (Tylenol and Motrin or Advil) you might have what we call "breakthrough" pain. You will receive a prescription for a small amount of an opioid pain medication such as   Oxycodone, Tramadol, or Tylenol with Codeine. Use these opioid pills in the first 24 hours after surgery if you have breakthrough pain. Do not take more than 1 pill every 4-6 hours.  If you still have uncontrolled pain after using all opioid pills, don't hesitate to call our staff using the number provided. We will help make sure you are managing your pain in the best way possible, and if necessary, we can provide a prescription for additional pain medication.   Day 1    Time  Name of Medication Number of pills taken  Amount of Acetaminophen  Pain Level   Comments  AM PM       AM PM       AM PM       AM PM       AM PM       AM PM       AM PM       AM PM       Total Daily amount of Acetaminophen Do not take more than  3,000 mg per day      Day 2    Time  Name of Medication Number of pills taken  Amount of Acetaminophen  Pain Level   Comments  AM  PM       AM PM       AM PM       AM PM       AM PM       AM PM       AM PM       AM PM       Total Daily amount of Acetaminophen Do not take more than  3,000 mg per day      Day 3    Time  Name of Medication Number of pills taken  Amount of Acetaminophen  Pain Level   Comments  AM PM       AM PM       AM PM       AM PM          AM PM       AM PM       AM PM       AM PM       Total Daily amount of Acetaminophen Do not take more than  3,000 mg per day      Day 4    Time  Name of Medication Number of pills taken  Amount of Acetaminophen  Pain Level   Comments  AM PM       AM PM       AM PM       AM PM       AM PM       AM PM       AM PM       AM PM       Total Daily amount of Acetaminophen Do not take more than  3,000 mg per day      Day 5    Time  Name of Medication Number of pills taken  Amount of Acetaminophen  Pain Level   Comments  AM PM       AM PM       AM PM       AM PM       AM PM       AM PM       AM PM         AM PM       Total Daily amount of Acetaminophen Do not take more than  3,000 mg per day       Day 6    Time  Name of Medication Number of pills taken  Amount of Acetaminophen  Pain Level  Comments  AM PM       AM PM       AM PM       AM PM       AM PM       AM PM       AM PM       AM PM       Total Daily amount of Acetaminophen Do not take more than  3,000 mg per day      Day 7    Time  Name of Medication Number of pills taken  Amount of Acetaminophen  Pain Level   Comments  AM PM       AM PM       AM PM       AM PM       AM PM       AM PM       AM PM       AM PM       Total Daily amount of Acetaminophen Do not take more than  3,000 mg per day        For additional information about how and where to safely dispose of unused opioid medications - https://www.morepowerful[employer].org  Disclaimer: This document contains information and/or instructional materials adapted from [facility]  for the typical patient with your condition. It does not replace medical advice from your health care provider because your experience may differ from that of the typical patient. Talk to your health care provider if you have any questions about this document, your condition or your treatment plan. Adapted from [facility]  

## 2020-09-10 NOTE — Discharge Summary (Signed)
[name] Discharge Summary   Patient ID: Nichole Manning MRN: [id number] DOB/AGE: 56-Nov-1967 56 y.o.  Admit date: 09/08/2020 Discharge date: 09/10/2020  Admitting Diagnosis: Acute appendicitis  Discharge Diagnosis S/p laparoscopic appendectomy   Consultants None   Imaging: CT ABDOMEN PELVIS W CONTRAST  Addendum Date: 09/09/2020   ADDENDUM REPORT: 09/09/2020 04:26 ADDENDUM: Study discussed by telephone with Dr. Wyvonnia Dusky in the ED on 09/09/2020 at 0415 hours. The appendix is identified arising from the cecum near coronal image 44 and tracking posteriorly along the right pelvic side wall. On coronal image 55 and series 3, image 63 there is an 8 mm appendicoliths, and distal to this the appendix is fluid-filled and dilated up to 8 mm diameter (series 3, image 64 and coronal image 65. There is perhaps trace peri-appendiceal inflammatory stranding at the tip. CONCLUSION: Appendicolith and suspected Early Acute Appendicitis. Recommend Surgery Consultation. If the clinical course is equivocal then repeat CT Abdomen and Pelvis is recommended in 12-24 hours. Electronically Signed   By: Genevie Ann M.D.   On: 09/09/2020 04:26   Result Date: 09/09/2020 CLINICAL DATA:  Abdominal EXAM: CT ABDOMEN AND PELVIS WITH CONTRAST TECHNIQUE: Multidetector CT imaging of the abdomen and pelvis was performed using the standard protocol following bolus administration of intravenous contrast. CONTRAST:  193mL ISOVUE-300 IOPAMIDOL (ISOVUE-300) INJECTION 61% COMPARISON:  None. FINDINGS: Lower chest: Lung bases are clear. No effusions. Heart is normal size. Hepatobiliary: No focal hepatic abnormality. Gallbladder unremarkable. Pancreas: No focal abnormality or ductal dilatation. Spleen: No focal abnormality.  Normal size. Adrenals/Urinary Tract: No adrenal abnormality. No focal renal abnormality. No stones or hydronephrosis. Urinary bladder is unremarkable. Stomach/Bowel: Stomach, large and small bowel grossly  unremarkable. Vascular/Lymphatic: Aortic atherosclerosis. No evidence of aneurysm or adenopathy. Reproductive: Prior hysterectomy.  No adnexal masses. Other: No free fluid or free air. Musculoskeletal: No acute bony abnormality. IMPRESSION: No acute findings in the abdomen or pelvis. Aortic atherosclerosis. Electronically Signed: By: Rolm Baptise M.D. On: 09/09/2020 03:38    Procedures Dr. Lurena Joiner Kinsinger (09/09/20) - Laparoscopic Appendectomy  Hospital Course:  Patient is a 55 year old female who presented to [facility] with abdominal pain.  Workup showed acute appendicitis.  Patient was admitted and underwent procedure listed above.  Tolerated procedure well and was transferred to the floor.  Diet was advanced as tolerated.  On POD#1, the patient was voiding well, tolerating diet, ambulating well, pain well controlled, vital signs stable, incisions c/d/i and felt stable for discharge home.  Patient will follow up in our office in 3 weeks and knows to call with questions or concerns. She will call to confirm appointment date/time.    Physical Exam: General:  Alert, NAD, pleasant, comfortable Abd:  Soft, ND, mild tenderness, incisions C/D/I  I or a member of my team have reviewed this patient in the Controlled Substance Database.   Allergies as of 09/10/2020      Reactions   Sulfa Antibiotics Hives   Adhesive [tape] Itching   Rxn was from Nail Glue.   Codeine Nausea And Vomiting      Medication List    TAKE these medications   acetaminophen 325 MG tablet Commonly known as: TYLENOL Take 2 tablets (650 mg total) by mouth every 6 (six) hours as needed for mild pain, fever or headache.   albuterol 108 (90 Base) MCG/ACT inhaler Commonly known as: VENTOLIN HFA Inhale into the lungs every 6 (six) hours as needed for wheezing or shortness of breath.   ALPRAZolam 0.5 MG  tablet Commonly known as: Xanax Take 1 tablet (0.5 mg total) by mouth at bedtime as needed for anxiety.   Bevespi Aerosphere  9-4.8 MCG/ACT Aero Generic drug: Glycopyrrolate-Formoterol Inhale 2 puffs into the lungs in the morning and at bedtime.   Calcium Citrate 250 MG Tabs Take 1 tablet by mouth daily.   Clindamycin-Benzoyl Per (Refr) gel Apply 1 application topically daily.   cyanocobalamin 1000 MCG/ML injection Commonly known as: (VITAMIN B-12) Inject 1,000 mcg into the muscle every 28 (twenty-eight) days.   levothyroxine 88 MCG tablet Commonly known as: SYNTHROID Take 88 mcg by mouth every morning.   multivitamin with minerals Tabs tablet Take 1 tablet by mouth daily.   Nurtec 75 MG Tbdp Generic drug: Rimegepant Sulfate Take 1 tablet by mouth every other day as needed (migraines).   oxyCODONE 5 MG immediate release tablet Commonly known as: Oxy IR/ROXICODONE Take 1 tablet (5 mg total) by mouth every 6 (six) hours as needed for moderate pain.   propranolol 10 MG tablet Commonly known as: INDERAL Take 10 mg by mouth as needed (palpitations).   spironolactone 25 MG tablet Commonly known as: ALDACTONE Take 25 mg by mouth daily.   vitamin C 500 MG tablet Commonly known as: ASCORBIC ACID Take 500 mg by mouth daily.         Follow-up Information    [facility], [place]. Go on 10/02/2020.   Specialty: General Surgery Why: Appointment scheduled for 8:30 AM. Please arrive 30 min prior to appointment time. Bring photo ID and insurance information with you.  Contact information: [address] [id number] [phone]               Signed: Norm Parcel , [facility] 09/10/2020, 9:11 AM Please see Amion for pager number during day hours 7:00am-4:30pm

## 2020-09-10 NOTE — Progress Notes (Signed)
Gave pt discharge instruction, IV discontinued clean and intact. Explain pain schedule for home, pt and daughter understood the re-education. Pt ask about gabapentin, Claiborne Billings PA called and explain that is normally given post-op.

## 2020-09-11 LAB — SURGICAL PATHOLOGY

## 2021-02-27 ENCOUNTER — Other Ambulatory Visit: Payer: Self-pay | Admitting: Adult Health

## 2021-02-27 DIAGNOSIS — Z1231 Encounter for screening mammogram for malignant neoplasm of breast: Secondary | ICD-10-CM

## 2021-03-09 ENCOUNTER — Ambulatory Visit: Admit: 2021-03-09 | Discharge status: Against Medical Advice | Payer: Medicaid Other

## 2021-03-10 ENCOUNTER — Ambulatory Visit
Admission: EM | Admit: 2021-03-10 | Discharge: 2021-03-10 | Disposition: A | Discharge status: Home / Self Care | Payer: Medicaid Other | Attending: Emergency Medicine | Admitting: Emergency Medicine

## 2021-03-10 ENCOUNTER — Other Ambulatory Visit: Payer: Self-pay

## 2021-03-10 ENCOUNTER — Ambulatory Visit: Payer: Self-pay

## 2021-03-10 VITALS — BP 105/72 | HR 73 | Temp 99.1°F | Resp 16

## 2021-03-10 DIAGNOSIS — B001 Herpesviral vesicular dermatitis: Secondary | ICD-10-CM | POA: Diagnosis not present

## 2021-03-10 DIAGNOSIS — B029 Zoster without complications: Secondary | ICD-10-CM

## 2021-03-10 MED ORDER — VALACYCLOVIR HCL 1 G PO TABS: 1000.0000 mg | ORAL_TABLET | Freq: Three times a day (TID) | ORAL | 0 refills | Status: AC

## 2021-03-10 NOTE — Discharge Instructions (Addendum)
Take valacyclovir as prescribed. Increase fluid intake. You may take Benadryl 25-50mg  every 4-6 hours as needed OR Zyrtec 10mg  daily for itching/inflammation. You may also take over the counter Famotidine 20mg  once daily to help with itching/inflammation. You may apply ice wrapped in a towel to affected area 3-5 times daily for 10-15 minute intervals. See your PCP if rash does not improve in 5-6 days. See your PCP or return to clinic sooner if rash worsens or you develop fever.

## 2021-03-10 NOTE — ED Triage Notes (Signed)
Onset of symptoms in July.  Areas of scalp that make her head hurt.  Has tried many otc shampoos.  Has discussed with pcp.  Patient specifically questions if this is shingles  Cough started 2 days ago

## 2021-03-10 NOTE — ED Provider Notes (Signed)
Chief Complaint   Chief Complaint  Patient presents with   Appointment   Cough     Subjective, HPI  Nichole Manning is a very pleasant 55 y.o. female who presents with areas of scalp that are painful with symptom onset in July.  Patient reports trying many over-the-counter shampoos.  Patient reports that this has been discussed with her PCP.  Patient concerned for shingles.  Patient also reports a cough that started 2 days ago.  No eye pain, fever, chills or vomiting reported.  History obtained from patient.   Patient's problem list, past medical and social history, medications, and allergies were reviewed by me and updated in Epic.    ROS  See HPI.  Objective   Vitals:   03/10/21 1217  BP: 105/72  Pulse: 73  Resp: 16  Temp: 99.1 F (37.3 C)  SpO2: 98%    Vital signs and nursing note reviewed.  General: Appears well-developed and well-nourished. No acute distress.  HEENT: Normocephalic, atraumatic, hearing grossly intact. EOMI, no drainage. No rhinorrhea. Moist mucous membranes.  Neck: Normal range of motion, neck is supple.  Cardiovascular: Normal rate.  No murmurs, gallops or rubs.  Normal rhythm. Pulm/Chest: No respiratory distress.  Lungs CTA. Musculoskeletal: No joint deformity, normal range of motion.  Skin: Lesions noted to scalp that are erythematous with a mild crusting and excoriation which does not past the midline to the left of her scalp.  Patient also has a cold sore to the oral mucosa.  Data  No results found for any visits on 03/10/21.   Assessment & Plan  1. Herpes zoster without complication - valACYclovir (VALTREX) 1000 MG tablet; Take 1 tablet (1,000 mg total) by mouth 3 (three) times daily for 7 days.  Dispense: 21 tablet; Refill: 0  2. Cold sore - valACYclovir (VALTREX) 1000 MG tablet; Take 1 tablet (1,000 mg total) by mouth 3 (three) times daily for 7 days.  Dispense: 21 tablet; Refill: 0  55 y.o. female presents with areas of scalp that are  painful with symptom onset in July.  Patient reports trying many over-the-counter shampoos.  Patient reports that this has been discussed with her PCP.  Patient concerned for shingles.  Patient also reports a cough that started 2 days ago.  No eye pain, fever, chills or vomiting reported.  Given symptoms along with assessment findings, concern for shingles given that the patient reported use of topical antifungal along with antibiotics, will trial valacyclovir 3 times daily for 7 days.  Advised to follow-up with her PCP if symptoms do not seem to improve over the next 5 to 6 days.  Advised that she may also use Benadryl or Zyrtec along with famotidine to help with itching and inflammation.  She may apply ice wrapped in a towel to the affected areas 3-5 times daily for 10 to 15-minute intervals.  Return to clinic or follow-up with PCP sooner if rash worsens or she begins to develop a fever.  Patient verbalized understanding and agreed with plan.  Patient stable upon discharge.  Return as needed.  Plan:   Discharge Instructions      Take valacyclovir as prescribed. Increase fluid intake. You may take Benadryl 25-50mg  every 4-6 hours as needed OR Zyrtec 10mg  daily for itching/inflammation. You may also take over the counter Famotidine 20mg  once daily to help with itching/inflammation. You may apply ice wrapped in a towel to affected area 3-5 times daily for 10-15 minute intervals. See your PCP if rash does  not improve in 5-6 days. See your PCP or return to clinic sooner if rash worsens or you develop fever.          Serafina Royals, [place] 03/10/21 1251

## 2021-03-15 ENCOUNTER — Ambulatory Visit
Admission: RE | Admit: 2021-03-15 | Discharge: 2021-03-15 | Disposition: A | Discharge status: Home / Self Care | Payer: Medicaid Other | Source: Ambulatory Visit | Attending: Adult Health | Admitting: Adult Health

## 2021-03-15 ENCOUNTER — Other Ambulatory Visit: Payer: Self-pay

## 2021-03-15 DIAGNOSIS — Z1231 Encounter for screening mammogram for malignant neoplasm of breast: Secondary | ICD-10-CM | POA: Diagnosis present

## 2021-03-18 ENCOUNTER — Telehealth: Payer: Self-pay | Admitting: Cardiology

## 2021-03-18 NOTE — Telephone Encounter (Signed)
Patient c/o Palpitations:  High priority if patient c/o lightheadedness, shortness of breath, or chest pain  How long have you had palpitations/irregular HR/ Afib? Are you having the symptoms now? PT HAVE NOT HAD PALPITATIONS SINCE 03/17/21 AT 4AM  Are you currently experiencing lightheadedness, SOB or CP? NO   Do you have a history of afib (atrial fibrillation) or irregular heart rhythm? NO  Have you checked your BP or HR? (document readings if available): NO PT DOES NOT HAVE ANY MEANS TO CHECK BP  Are you experiencing any other symptoms? PT CURRENTLY HAVE SHINGLES, SOB

## 2021-03-18 NOTE — Telephone Encounter (Signed)
Returned patient's call, she complains of recently worsening palpitations, awakening her at night.  Pt states she has been under a lot of stress, she drank a drink containing alcohol and wonders if that is the cause.  Since her last OV she is vaping and not smoking.  She has not taken propanolol as prescribed, nor is she taking her thyroid medication.  Pt denies symptoms of shortness of breath, or fatigue.  Encouraged patient to avoid caffeinated drinks, try propanolol as prescribed by PCP.  Appt. Scheduled 04/05/2021 with Laurann Montana NP.  Pt knows to call back if symptoms change.

## 2021-04-05 ENCOUNTER — Ambulatory Visit (HOSPITAL_BASED_OUTPATIENT_CLINIC_OR_DEPARTMENT_OTHER): Payer: Medicaid Other | Admitting: Family

## 2021-05-01 ENCOUNTER — Encounter (HOSPITAL_BASED_OUTPATIENT_CLINIC_OR_DEPARTMENT_OTHER): Payer: Self-pay | Admitting: General Practice

## 2021-05-01 ENCOUNTER — Ambulatory Visit (INDEPENDENT_AMBULATORY_CARE_PROVIDER_SITE_OTHER): Payer: Medicaid Other | Admitting: General Practice

## 2021-05-01 ENCOUNTER — Other Ambulatory Visit: Payer: Self-pay

## 2021-05-01 VITALS — BP 108/78 | HR 74 | Ht 65.0 in | Wt 140.0 lb

## 2021-05-01 DIAGNOSIS — R002 Palpitations: Secondary | ICD-10-CM | POA: Diagnosis not present

## 2021-05-01 DIAGNOSIS — Z716 Tobacco abuse counseling: Secondary | ICD-10-CM | POA: Diagnosis not present

## 2021-05-01 DIAGNOSIS — R931 Abnormal findings on diagnostic imaging of heart and coronary circulation: Secondary | ICD-10-CM

## 2021-05-01 DIAGNOSIS — Z8616 Personal history of COVID-19: Secondary | ICD-10-CM | POA: Diagnosis not present

## 2021-05-01 NOTE — Progress Notes (Signed)
Cardiology Office Note:    Date:  05/01/2021   ID:  Nichole Manning, DOB [birth year], MRN [id number]  PCP:  Gae Bon, NP   [facility] Providers Cardiologist:  Buford Dresser, MD      Referring MD: Gae Bon, NP   Evaluation of palpitations  History of Present Illness:    Nichole Manning is a 55 y.o. female with a hx of tobacco use, COVID infection 6/20, hypothyroidism, abnormal echocardiogram, and palpitations.  Previous echocardiogram showed mild LVH, mild MR.  Labs received from PCP showed normal PNET, largely normal LFTs, LDL 66, normal iron studies, normal ferritin, and negative rheumatoid factor.  She was seen by Dr. Harrell Gave on 11/14/2019.  During that time her main complaint was palpitations at night.  She had noticed them since having COVID 6/20.  Her PCP noted she had been tried on propranolol.  She was not taking medication at time of the visit.  She felt like she had not been able to breathe at her baseline since having COVID.  She had difficulty wearing a mask.  She had no energy and felt tired most of the time.  She was routinely coughing up sputum.  She was a current smoker and smoked since the age of 37.  She tried to quit multiple times.  She used smoking and stress relief.  Smoking cessation was discussed.  Her echocardiogram is reviewed.  Her EKG was reviewed as well.  She was counseled on lifestyle recommendations and risk reduction.  She denied chest pain, PND, orthopnea, lower extremity swelling and unexplained weight gain.  She denied syncope.  She presents to the clinic today for follow-up evaluation states she has had 2 episodes of palpitations in the last 6 months.  Both episodes were at night and woke her from her sleep.  These have continued to be stable since she was last seen by cardiology.  We reviewed her labs from her PCP including her thyroid panel, cholesterol, and CBC.  She noted elevated triglycerides.  We will repeat her  lipid panel.  We discussed omega-3 fatty acids for reducing her triglycerides.  We also talked about adding fish to her diet.  We reviewed her EKG and she expressed understanding.  We also reviewed her previous cardiac event monitor and talked about PVCs, SVT, and extra atrial beats.  We will give her a lab slip so that she may have her fasting lipids drawn at [facility].  We will continue her current medication regimen, have her increase her physical activity as tolerated, and give her information on heart healthy diet.  We will plan follow-up for 9 to 12 months.  She also notes swelling over her left collarbone.  She denies trauma.  She reports that they swelling has appeared over the last week.  I have instructed her to follow-up with her PCP if the swelling does not dissipate over the next 2 weeks.  Today she denies chest pain, shortness of breath, lower extremity edema, fatigue, palpitations, melena, hematuria, hemoptysis, diaphoresis, weakness, presyncope, syncope, orthopnea, and PND.   Past Medical History:  Diagnosis Date   Anxiety    COPD (chronic obstructive pulmonary disease) ([place])    Fibroids    Hypothyroidism    Palpitations    SVD (spontaneous vaginal delivery)    x 3    Past Surgical History:  Procedure Laterality Date   AUGMENTATION MAMMAPLASTY     BILATERAL SALPINGECTOMY Bilateral 07/01/2013   Procedure: BILATERAL SALPINGECTOMY;  Surgeon: Delfino Lovett  Beulah Gandy, MD;  Location: [facility];  Service: Gynecology;  Laterality: Bilateral;   BREAST SURGERY     breast reduction x 2   LAPAROSCOPIC APPENDECTOMY N/A 09/09/2020   Procedure: APPENDECTOMY LAPAROSCOPIC;  Surgeon: Kinsinger, Arta Bruce, MD;  Location: [facility];  Service: General;  Laterality: N/A;   TUBAL LIGATION     WISDOM TOOTH EXTRACTION      Current Medications: Current Meds  Medication Sig   acetaminophen (TYLENOL) 325 MG tablet Take 2 tablets (650 mg total) by mouth every 6 (six) hours as needed for mild pain, fever or  headache.   albuterol (VENTOLIN HFA) 108 (90 Base) MCG/ACT inhaler Inhale into the lungs every 6 (six) hours as needed for wheezing or shortness of breath.   ALPRAZolam (XANAX) 0.5 MG tablet Take 1 tablet (0.5 mg total) by mouth at bedtime as needed for anxiety.   BEVESPI AEROSPHERE 9-4.8 MCG/ACT AERO Inhale 2 puffs into the lungs in the morning and at bedtime.   Calcium Citrate 250 MG TABS Take 1 tablet by mouth daily.   Clindamycin-Benzoyl Per, Refr, gel Apply 1 application topically daily.   cyanocobalamin (,VITAMIN B-12,) 1000 MCG/ML injection Inject 1,000 mcg into the muscle every 28 (twenty-eight) days.   levothyroxine (SYNTHROID) 88 MCG tablet Take 88 mcg by mouth every morning.   Multiple Vitamin (MULTIVITAMIN WITH MINERALS) TABS tablet Take 1 tablet by mouth daily.   propranolol (INDERAL) 10 MG tablet Take 10 mg by mouth as needed (palpitations).   spironolactone (ALDACTONE) 25 MG tablet Take 25 mg by mouth daily.   vitamin C (ASCORBIC ACID) 500 MG tablet Take 500 mg by mouth daily.     Allergies:   Sulfa antibiotics, Adhesive [tape], and Codeine   Social History   Socioeconomic History   Marital status: Married    Spouse name: Not on file   Number of children: Not on file   Years of education: Not on file   Highest education level: Not on file  Occupational History   Not on file  Tobacco Use   Smoking status: Former    Packs/day: 1.50    Years: 30.00    Pack years: 45.00    Types: Cigarettes   Smokeless tobacco: Never   Tobacco comments:    20 cigarettes per day 11/16/19 [facility]   Vaping Use   Vaping Use: Some days  Substance and Sexual Activity   Alcohol use: Yes    Comment: social   Drug use: No   Sexual activity: Yes    Birth control/protection: Surgical  Other Topics Concern   Not on file  Social History Narrative   Leave with son   2-story home   Left handed   2-cup cofee   Social Determinants of Health   Financial Resource Strain: Not on file  Food  Insecurity: Not on file  Transportation Needs: Not on file  Physical Activity: Not on file  Stress: Not on file  Social Connections: Not on file     Family History: The patient's family history includes Breast cancer in her mother; Cancer in her father.  ROS:   Please see the history of present illness.     All other systems reviewed and are negative.   Risk Assessment/Calculations:           Physical Exam:    VS:  BP 108/78   Pulse 74   Ht 5\' 5"  (1.651 m)   Wt 140 lb (63.5 kg)   BMI 23.30 kg/m  Wt Readings from Last 3 Encounters:  05/01/21 140 lb (63.5 kg)  09/08/20 126 lb 12.2 oz (57.5 kg)  11/16/19 126 lb 12.8 oz (57.5 kg)     GEN:  Well nourished, well developed in no acute distress HEENT: Normal NECK: No JVD; No carotid bruits LYMPHATICS: No lymphadenopathy CARDIAC: RRR, no murmurs, rubs, gallops RESPIRATORY:  Clear to auscultation without rales, wheezing or rhonchi  ABDOMEN: Soft, non-tender, non-distended MUSCULOSKELETAL:  No edema; No deformity  SKIN: Warm and dry slight nonpitting edema noted over her left clavicle NEUROLOGIC:  Alert and oriented x 3 PSYCHIATRIC:  Normal affect    EKGs/Labs/Other Studies Reviewed:    The following studies were reviewed today:  Cardiac event monitor 01/24/2020  ~3 days of data recorded on Zio monitor. Patient had a min HR of 55 bpm, max HR of 144 bpm, and avg HR of 79 bpm. Predominant underlying rhythm was Sinus Rhythm. No VT, atrial fibrillation, high degree block, or pauses noted. There was one 5 beat run of SVT at a rate of 114 bpm. Isolated atrial and ventricular ectopy was rare (<1%). There were 2 triggered events. These were sinus rhythm with a PVC. No significant arrhythmias detected.  EKG:  EKG is  ordered today.  The ekg ordered today demonstrates normal sinus rhythm possible left atrial enlargement 74 bpm  Recent Labs: 09/08/2020: ALT 16 09/10/2020: BUN 11; Creatinine, Ser 0.89; Hemoglobin 11.8; Platelets  190; Potassium 4.1; Sodium 139  Recent Lipid Panel No results found for: CHOL, TRIG, HDL, CHOLHDL, VLDL, LDLCALC, LDLDIRECT  ASSESSMENT & PLAN    Palpitations-continues to notice brief occasional episodes of palpitations at night.  Cardiac event monitor showed triggered events to be sinus rhythm with PVCs.  She had one 5 beat run of SVT and was also noted to have isolated atrial and ventricular ectopy that was rare. Continue propranolol as needed for palpitations Heart healthy low-sodium diet-salty 6 given Increase physical activity as tolerated Maintain p.o. hydration Avoid triggers caffeine, chocolate, EtOH, dehydration etc.  Abnormal echocardiogram-asymptomatic.  Echocardiogram noted mild posterior wall LVH and mild MR.  EF was normal Heart healthy low-sodium diet Increase physical activity as tolerated  Tobacco use-stop smoking. Congratulated on cessation  History of COVID-19 infection-continues to slowly increase physical activity.  Continues to feel fatigued and has been chronically short of breath.  Follows with post-COVID clinic.  Continues to maintain oxygen saturation. Follows with PCP  Disposition: Follow-up with Dr. Harrell Gave or me in 9-12 months.       Medication Adjustments/Labs and Tests Ordered: Current medicines are reviewed at length with the patient today.  Concerns regarding medicines are outlined above.  Orders Placed This Encounter  Procedures   Lipid panel   EKG 12-Lead   No orders of the defined types were placed in this encounter.   Patient Instructions  Medication Instructions:  Your Physician recommend you continue on your current medication as directed.    *If you need a refill on your cardiac medications before your next appointment, please call your pharmacy*   Lab Work: Please return for Lab work in the next few weeks.  You may come to the...   [facility] (3rd floor) [address] [place], [place] at [facility]- Any location   If you have labs (blood work) drawn today and your tests are completely normal, you will receive your results only by: [employer] (if you have  MyChart) OR A paper copy in the mail If you have any lab test that is abnormal or we need to change your treatment, we will call you to review the results.   Testing/Procedures: None ordered today    Follow-Up: At [facility], you and your health needs are our priority.  As part of our continuing mission to provide you with exceptional heart care, we have created designated Provider Care Teams.  These Care Teams include your primary Cardiologist (physician) and Advanced Practice Providers (APPs -  Physician Assistants and Nurse Practitioners) who all work together to provide you with the care you need, when you need it.  We recommend signing up for the patient portal called "MyChart".  Sign up information is provided on this After Visit Summary.  MyChart is used to connect with patients for Virtual Visits (Telemedicine).  Patients are able to view lab/test results, encounter notes, upcoming appointments, etc.  Non-urgent messages can be sent to your provider as well.   To learn more about what you can do with MyChart, go to [email or web address].    Your next appointment:   9-12 month(s)  The format for your next appointment:   In Person  Provider:   Buford Dresser, MD or Coletta Memos, NP    Other Instructions If swelling around the left collarbone has not improved within the next 2 weeks please consult your PCP and request an Ultrasound   Heart-Healthy Eating Plan Heart-healthy meal planning includes: Eating less unhealthy fats. Eating more healthy fats. Making other changes in your diet. Talk with your doctor or a diet specialist (dietitian) to create an eating plan that is right for you. What is my plan? Your doctor may  recommend an eating plan that includes: Total fat: ______% or less of total calories a day. Saturated fat: ______% or less of total calories a day. Cholesterol: less than _________mg a day. What are tips for following this plan? Cooking Avoid frying your food. Try to bake, boil, grill, or broil it instead. You can also reduce fat by: Removing the skin from poultry. Removing all visible fats from meats. Steaming vegetables in water or broth. Meal planning  At meals, divide your plate into four equal parts: Fill one-half of your plate with vegetables and green salads. Fill one-fourth of your plate with whole grains. Fill one-fourth of your plate with lean protein foods. Eat 4-5 servings of vegetables per day. A serving of vegetables is: 1 cup of raw or cooked vegetables. 2 cups of raw leafy greens. Eat 4-5 servings of fruit per day. A serving of fruit is: 1 medium whole fruit.  cup of dried fruit.  cup of fresh, frozen, or canned fruit.  cup of 100% fruit juice. Eat more foods that have soluble fiber. These are apples, broccoli, carrots, beans, peas, and barley. Try to get 20-30 g of fiber per day. Eat 4-5 servings of nuts, legumes, and seeds per week: 1 serving of dried beans or legumes equals  cup after being cooked. 1 serving of nuts is  cup. 1 serving of seeds equals 1 tablespoon. General information Eat more home-cooked food. Eat less restaurant, buffet, and fast food. Limit or avoid alcohol. Limit foods that are high in starch and sugar. Avoid fried foods. Lose weight if you are overweight. Keep track of how much salt (sodium) you eat. This is important if you have high blood pressure. Ask your doctor to tell you more about this. Try to add vegetarian meals each week. Fats  Choose healthy fats. These include olive oil and canola oil, flaxseeds, walnuts, almonds, and seeds. Eat more omega-3 fats. These include salmon, mackerel, sardines, tuna, flaxseed oil, and ground  flaxseeds. Try to eat fish at least 2 times each week. Check food labels. Avoid foods with trans fats or high amounts of saturated fat. Limit saturated fats. These are often found in animal products, such as meats, butter, and cream. These are also found in plant foods, such as palm oil, palm kernel oil, and coconut oil. Avoid foods with partially hydrogenated oils in them. These have trans fats. Examples are stick margarine, some tub margarines, cookies, crackers, and other baked goods. What foods can I eat? Fruits All fresh, canned (in natural juice), or frozen fruits. Vegetables Fresh or frozen vegetables (raw, steamed, roasted, or grilled). Green salads. Grains Most grains. Choose whole wheat and whole grains most of the time. Rice and pasta, including brown rice and pastas made with whole wheat. Meats and other proteins Lean, well-trimmed beef, veal, pork, and lamb. Chicken and [place] without skin. All fish and shellfish. Wild duck, rabbit, pheasant, and venison. Egg whites or low-cholesterol egg substitutes. Dried beans, peas, lentils, and tofu. Seeds and most nuts. Dairy Low-fat or nonfat cheeses, including ricotta and mozzarella. Skim or 1% milk that is liquid, powdered, or evaporated. Buttermilk that is made with low-fat milk. Nonfat or low-fat yogurt. Fats and oils Non-hydrogenated (trans-free) margarines. Vegetable oils, including soybean, sesame, sunflower, olive, peanut, safflower, corn, canola, and cottonseed. Salad dressings or mayonnaise made with a vegetable oil. Beverages Mineral water. Coffee and tea. Diet carbonated beverages. Sweets and desserts Sherbet, gelatin, and fruit ice. Small amounts of dark chocolate. Limit all sweets and desserts. Seasonings and condiments All seasonings and condiments. The items listed above may not be a complete list of foods and drinks you can eat. Contact a dietitian for more options. What foods should I avoid? Fruits Canned fruit in  heavy syrup. Fruit in cream or butter sauce. Fried fruit. Limit coconut. Vegetables Vegetables cooked in cheese, cream, or butter sauce. Fried vegetables. Grains Breads that are made with saturated or trans fats, oils, or whole milk. Croissants. Sweet rolls. Donuts. High-fat crackers, such as cheese crackers. Meats and other proteins Fatty meats, such as hot dogs, ribs, sausage, bacon, rib-eye roast or steak. High-fat deli meats, such as salami and bologna. Caviar. Domestic duck and goose. Organ meats, such as liver. Dairy Cream, sour cream, cream cheese, and creamed cottage cheese. Whole-milk cheeses. Whole or 2% milk that is liquid, evaporated, or condensed. Whole buttermilk. Cream sauce or high-fat cheese sauce. Yogurt that is made from whole milk. Fats and oils Meat fat, or shortening. Cocoa butter, hydrogenated oils, palm oil, coconut oil, palm kernel oil. Solid fats and shortenings, including bacon fat, salt pork, lard, and butter. Nondairy cream substitutes. Salad dressings with cheese or sour cream. Beverages Regular sodas and juice drinks with added sugar. Sweets and desserts Frosting. Pudding. Cookies. Cakes. Pies. Milk chocolate or white chocolate. Buttered syrups. Full-fat ice cream or ice cream drinks. The items listed above may not be a complete list of foods and drinks to avoid. Contact a dietitian for more information. Summary Heart-healthy meal planning includes eating less unhealthy fats, eating more healthy fats, and making other changes in your diet. Eat a balanced diet. This includes fruits and vegetables, low-fat or nonfat dairy, lean protein, nuts and legumes, whole grains, and heart-healthy oils and fats. This information is not intended to replace advice given to you  by your health care provider. Make sure you discuss any questions you have with your health care provider. Document Revised: 10/04/2020 Document Reviewed: 10/04/2020 Elsevier Patient Education  2022  [place]wood, Deberah Pelton, NP  05/01/2021 4:30 PM      Notice: This dictation was prepared with Dragon dictation along with smaller phrase technology. Any transcriptional errors that result from this process are unintentional and may not be corrected upon review.  I spent 15 minutes examining this patient, reviewing medications, and using patient centered shared decision making involving her cardiac care.  Prior to her visit I spent greater than 20 minutes reviewing her past medical history,  medications, and prior cardiac tests.

## 2021-05-01 NOTE — Patient Instructions (Signed)
Medication Instructions:  Your Physician recommend you continue on your current medication as directed.    *If you need a refill on your cardiac medications before your next appointment, please call your pharmacy*   Lab Work: Please return for Lab work in the next few weeks.  You may come to the...   Location manager (3rd floor) [address] [place], [place] [facility] [place]- Any location   If you have labs (blood work) drawn today and your tests are completely normal, you will receive your results only by: [employer] (if you have MyChart) OR A paper copy in the mail If you have any lab test that is abnormal or we need to change your treatment, we will call you to review the results.   Testing/Procedures: None ordered today    Follow-Up: At [facility], you and your health needs are our priority.  As part of our continuing mission to provide you with exceptional heart care, we have created designated Provider Care Teams.  These Care Teams include your primary Cardiologist (physician) and Advanced Practice Providers (APPs -  Physician Assistants and Nurse Practitioners) who all work together to provide you with the care you need, when you need it.  We recommend signing up for the patient portal called "MyChart".  Sign up information is provided on this After Visit Summary.  MyChart is used to connect with patients for Virtual Visits (Telemedicine).  Patients are able to view lab/test results, encounter notes, upcoming appointments, etc.  Non-urgent messages can be sent to your provider as well.   To learn more about what you can do with MyChart, go to [email or web address].    Your next appointment:   9-12 month(s)  The format for your next appointment:   In Person  Provider:   Buford Dresser, MD or Coletta Memos, NP    Other Instructions If swelling around the left  collarbone has not improved within the next 2 weeks please consult your PCP and request an Ultrasound   Heart-Healthy Eating Plan Heart-healthy meal planning includes: Eating less unhealthy fats. Eating more healthy fats. Making other changes in your diet. Talk with your doctor or a diet specialist (dietitian) to create an eating plan that is right for you. What is my plan? Your doctor may recommend an eating plan that includes: Total fat: ______% or less of total calories a day. Saturated fat: ______% or less of total calories a day. Cholesterol: less than _________mg a day. What are tips for following this plan? Cooking Avoid frying your food. Try to bake, boil, grill, or broil it instead. You can also reduce fat by: Removing the skin from poultry. Removing all visible fats from meats. Steaming vegetables in water or broth. Meal planning  At meals, divide your plate into four equal parts: Fill one-half of your plate with vegetables and green salads. Fill one-fourth of your plate with whole grains. Fill one-fourth of your plate with lean protein foods. Eat 4-5 servings of vegetables per day. A serving of vegetables is: 1 cup of raw or cooked vegetables. 2 cups of raw leafy greens. Eat 4-5 servings of fruit per day. A serving of fruit is: 1 medium whole fruit.  cup of dried fruit.  cup of fresh, frozen, or canned fruit.  cup of 100% fruit juice. Eat more foods that have soluble fiber. These are apples, broccoli, carrots, beans, peas, and barley. Try to get  20-30 g of fiber per day. Eat 4-5 servings of nuts, legumes, and seeds per week: 1 serving of dried beans or legumes equals  cup after being cooked. 1 serving of nuts is  cup. 1 serving of seeds equals 1 tablespoon. General information Eat more home-cooked food. Eat less restaurant, buffet, and fast food. Limit or avoid alcohol. Limit foods that are high in starch and sugar. Avoid fried foods. Lose weight if you are  overweight. Keep track of how much salt (sodium) you eat. This is important if you have high blood pressure. Ask your doctor to tell you more about this. Try to add vegetarian meals each week. Fats Choose healthy fats. These include olive oil and canola oil, flaxseeds, walnuts, almonds, and seeds. Eat more omega-3 fats. These include salmon, mackerel, sardines, tuna, flaxseed oil, and ground flaxseeds. Try to eat fish at least 2 times each week. Check food labels. Avoid foods with trans fats or high amounts of saturated fat. Limit saturated fats. These are often found in animal products, such as meats, butter, and cream. These are also found in plant foods, such as palm oil, palm kernel oil, and coconut oil. Avoid foods with partially hydrogenated oils in them. These have trans fats. Examples are stick margarine, some tub margarines, cookies, crackers, and other baked goods. What foods can I eat? Fruits All fresh, canned (in natural juice), or frozen fruits. Vegetables Fresh or frozen vegetables (raw, steamed, roasted, or grilled). Green salads. Grains Most grains. Choose whole wheat and whole grains most of the time. Rice and pasta, including brown rice and pastas made with whole wheat. Meats and other proteins Lean, well-trimmed beef, veal, pork, and lamb. Chicken and [place] without skin. All fish and shellfish. Wild duck, rabbit, pheasant, and venison. Egg whites or low-cholesterol egg substitutes. Dried beans, peas, lentils, and tofu. Seeds and most nuts. Dairy Low-fat or nonfat cheeses, including ricotta and mozzarella. Skim or 1% milk that is liquid, powdered, or evaporated. Buttermilk that is made with low-fat milk. Nonfat or low-fat yogurt. Fats and oils Non-hydrogenated (trans-free) margarines. Vegetable oils, including soybean, sesame, sunflower, olive, peanut, safflower, corn, canola, and cottonseed. Salad dressings or mayonnaise made with a vegetable oil. Beverages Mineral water.  Coffee and tea. Diet carbonated beverages. Sweets and desserts Sherbet, gelatin, and fruit ice. Small amounts of dark chocolate. Limit all sweets and desserts. Seasonings and condiments All seasonings and condiments. The items listed above may not be a complete list of foods and drinks you can eat. Contact a dietitian for more options. What foods should I avoid? Fruits Canned fruit in heavy syrup. Fruit in cream or butter sauce. Fried fruit. Limit coconut. Vegetables Vegetables cooked in cheese, cream, or butter sauce. Fried vegetables. Grains Breads that are made with saturated or trans fats, oils, or whole milk. Croissants. Sweet rolls. Donuts. High-fat crackers, such as cheese crackers. Meats and other proteins Fatty meats, such as hot dogs, ribs, sausage, bacon, rib-eye roast or steak. High-fat deli meats, such as salami and bologna. Caviar. Domestic duck and goose. Organ meats, such as liver. Dairy Cream, sour cream, cream cheese, and creamed cottage cheese. Whole-milk cheeses. Whole or 2% milk that is liquid, evaporated, or condensed. Whole buttermilk. Cream sauce or high-fat cheese sauce. Yogurt that is made from whole milk. Fats and oils Meat fat, or shortening. Cocoa butter, hydrogenated oils, palm oil, coconut oil, palm kernel oil. Solid fats and shortenings, including bacon fat, salt pork, lard, and butter. Nondairy cream substitutes. Salad dressings with  cheese or sour cream. Beverages Regular sodas and juice drinks with added sugar. Sweets and desserts Frosting. Pudding. Cookies. Cakes. Pies. Milk chocolate or white chocolate. Buttered syrups. Full-fat ice cream or ice cream drinks. The items listed above may not be a complete list of foods and drinks to avoid. Contact a dietitian for more information. Summary Heart-healthy meal planning includes eating less unhealthy fats, eating more healthy fats, and making other changes in your diet. Eat a balanced diet. This includes  fruits and vegetables, low-fat or nonfat dairy, lean protein, nuts and legumes, whole grains, and heart-healthy oils and fats. This information is not intended to replace advice given to you by your health care provider. Make sure you discuss any questions you have with your health care provider. Document Revised: 10/04/2020 Document Reviewed: 10/04/2020 Elsevier Patient Education  2022 [employer].

## 2021-05-05 IMAGING — MG DIGITAL SCREENING BREAST BILAT IMPLANT W/ TOMO W/ CAD
8 of 12 series · 8 of 28 positions shown · non-contrast
Comparison: Previous exam(s).

CLINICAL DATA: Screening.

EXAM:
DIGITAL SCREENING BILATERAL MAMMOGRAM WITH IMPLANTS, CAD AND TOMO
The patient has retropectoral implants. Standard and implant
displaced views were performed.

[R CC]
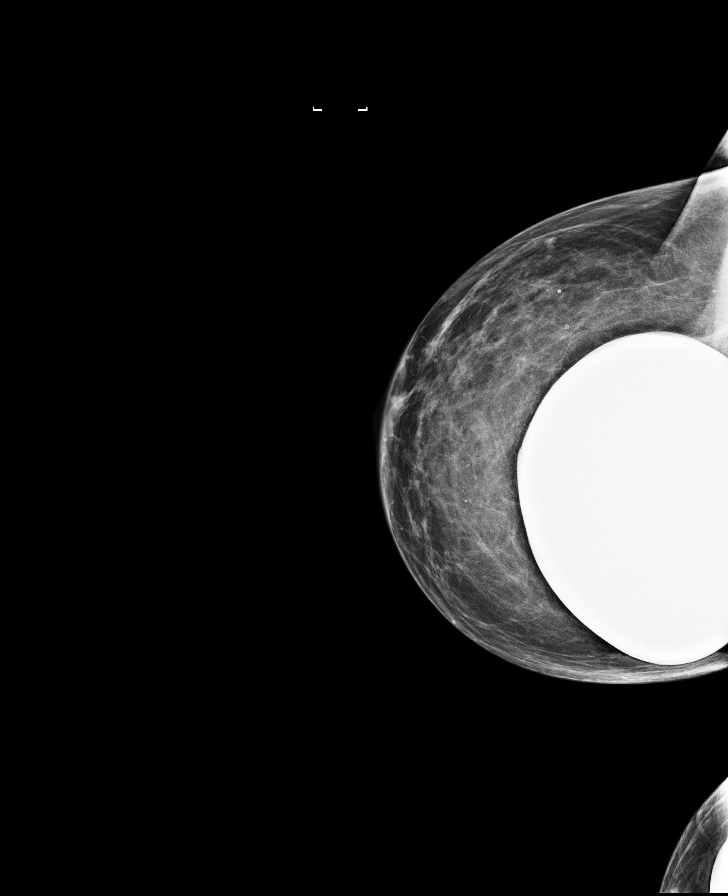

[R MLO]
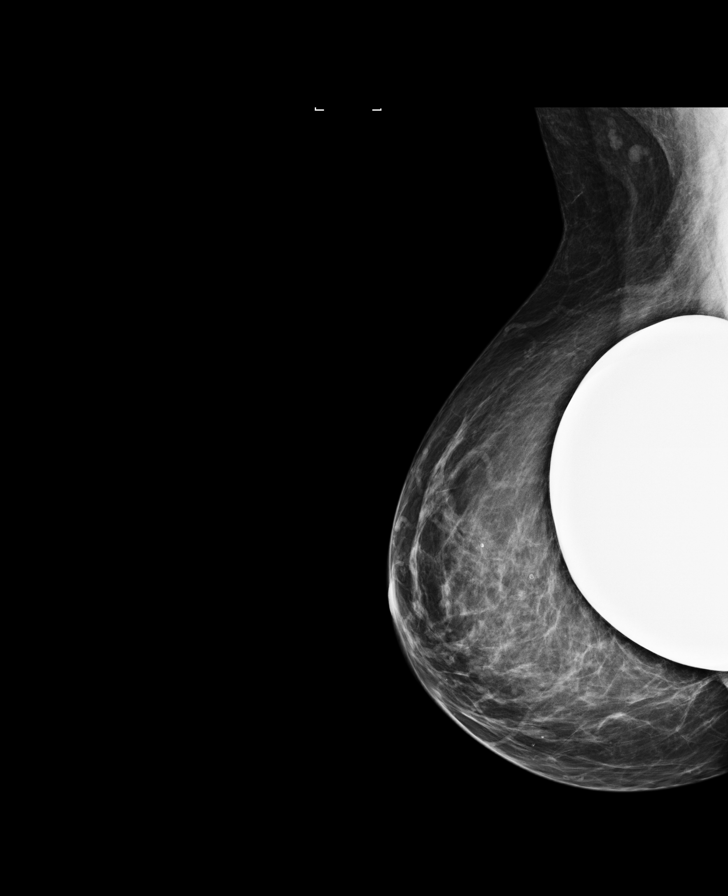

[L MLO]
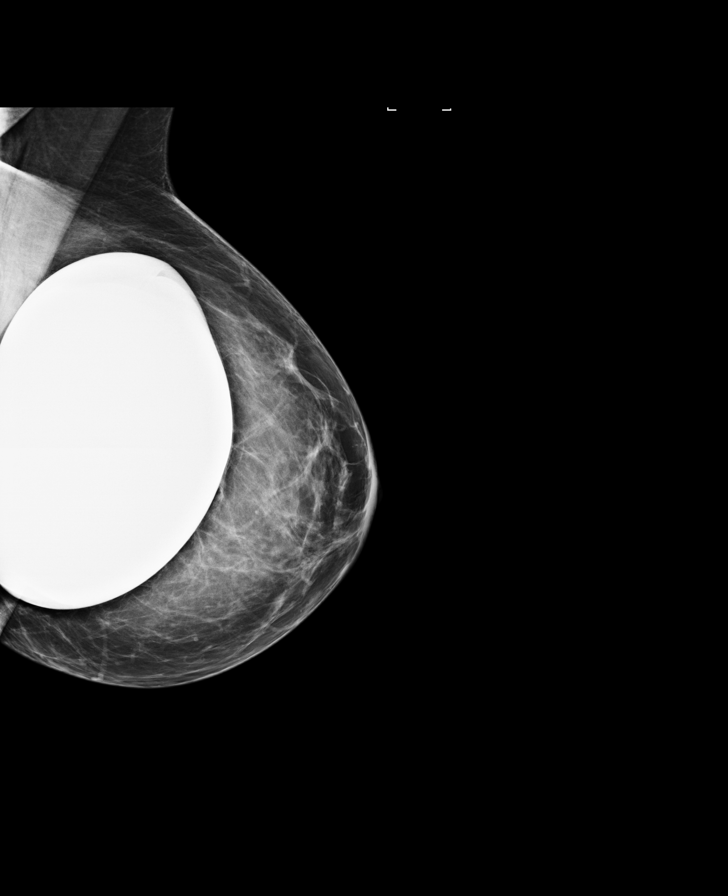

[L CC]
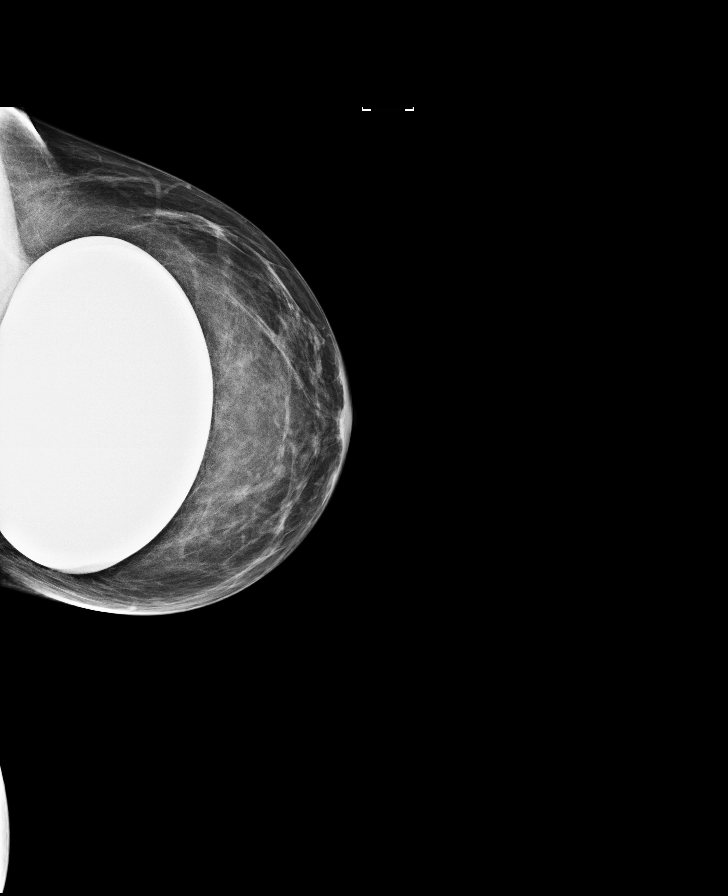

[R CC synth-2D]
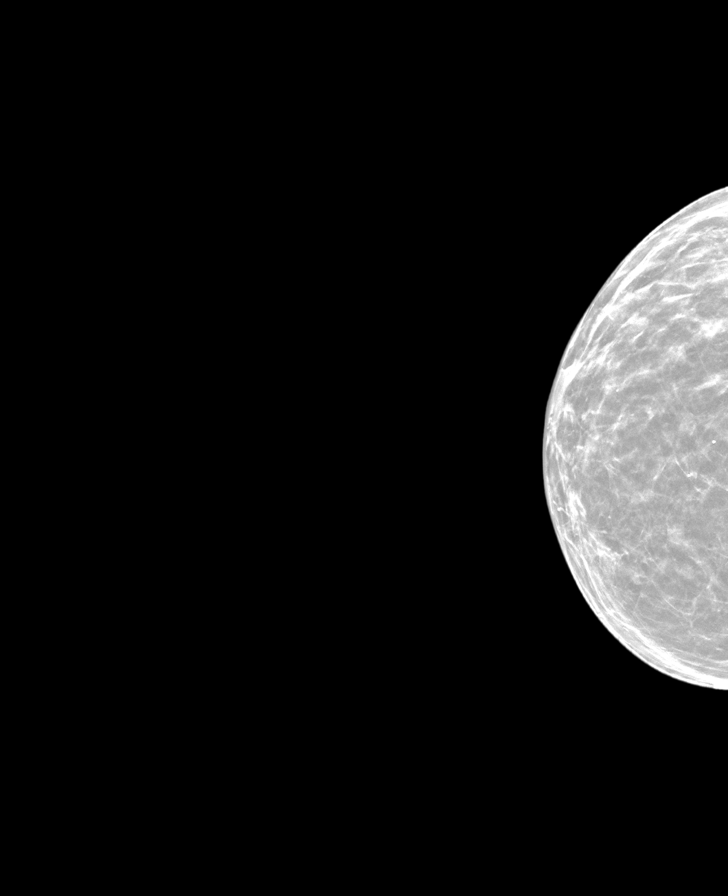

[R MLO synth-2D]
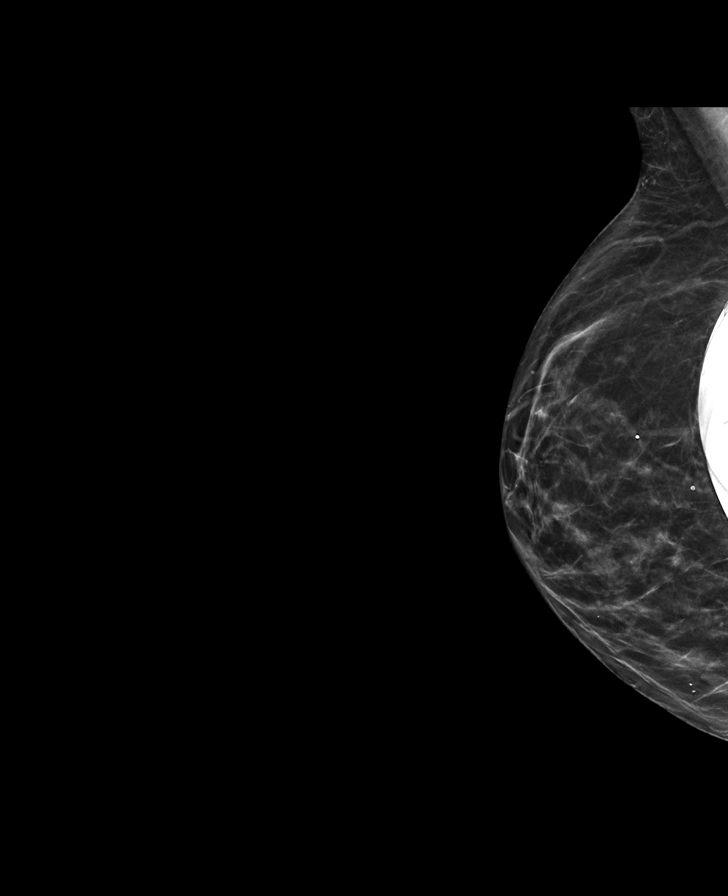

[L MLO synth-2D]
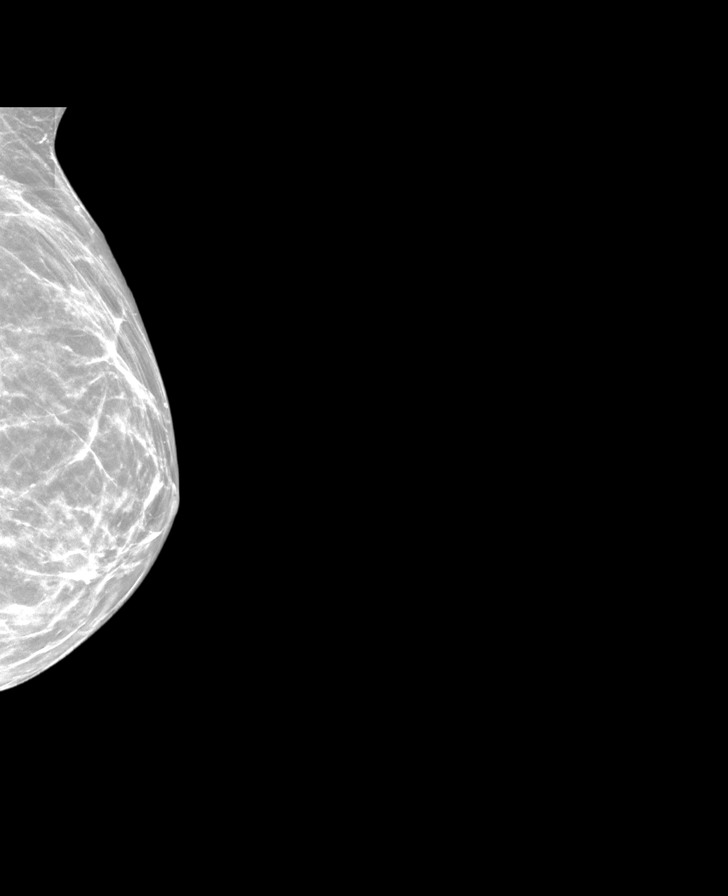

[L CC synth-2D]
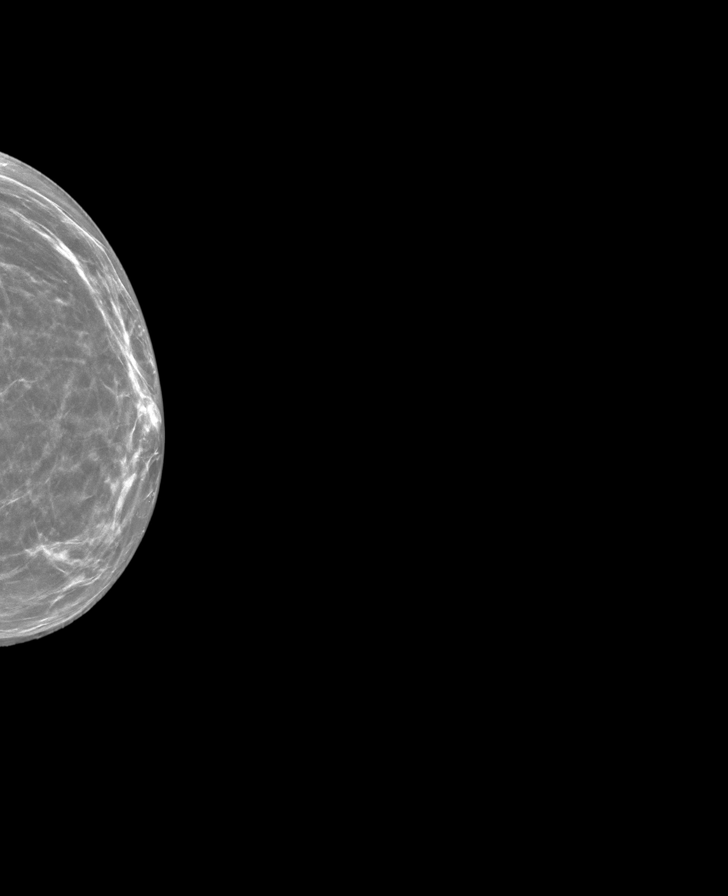

[8 of 28 positions shown; findings below may reference images not displayed]

ACR Breast Density Category c: The breast tissue is heterogeneously
dense, which may obscure small masses.
FINDINGS: There are no findings suspicious for malignancy. Images were
processed with CAD.
IMPRESSION: No mammographic evidence of malignancy. A result letter of this
screening mammogram will be mailed directly to the patient.

RECOMMENDATION:
Screening mammogram in one year. (Code:49-X-OQ9)

BI-RADS CATEGORY  1:  Negative.

## 2021-05-06 ENCOUNTER — Encounter (HOSPITAL_BASED_OUTPATIENT_CLINIC_OR_DEPARTMENT_OTHER): Payer: Self-pay

## 2021-05-10 ENCOUNTER — Institutional Professional Consult (permissible substitution): Payer: Medicaid Other | Admitting: Plastic Surgery

## 2021-06-24 ENCOUNTER — Emergency Department (HOSPITAL_BASED_OUTPATIENT_CLINIC_OR_DEPARTMENT_OTHER)
Admission: EM | Admit: 2021-06-24 | Discharge: 2021-06-24 | Disposition: A | Discharge status: Home / Self Care | Payer: Medicaid Other | Attending: Emergency Medicine | Admitting: Emergency Medicine

## 2021-06-24 ENCOUNTER — Other Ambulatory Visit: Payer: Self-pay

## 2021-06-24 ENCOUNTER — Emergency Department (HOSPITAL_BASED_OUTPATIENT_CLINIC_OR_DEPARTMENT_OTHER): Payer: Medicaid Other

## 2021-06-24 ENCOUNTER — Encounter (HOSPITAL_BASED_OUTPATIENT_CLINIC_OR_DEPARTMENT_OTHER): Payer: Self-pay

## 2021-06-24 DIAGNOSIS — Z87891 Personal history of nicotine dependence: Secondary | ICD-10-CM | POA: Insufficient documentation

## 2021-06-24 DIAGNOSIS — R222 Localized swelling, mass and lump, trunk: Secondary | ICD-10-CM | POA: Diagnosis present

## 2021-06-24 LAB — CBC WITH DIFFERENTIAL/PLATELET
Abs Immature Granulocytes: 0.04 10*3/uL (ref 0.00–0.07)
Basophils Absolute: 0.1 10*3/uL (ref 0.0–0.1)
Basophils Relative: 1 %
Eosinophils Absolute: 0.3 10*3/uL (ref 0.0–0.5)
Eosinophils Relative: 4 %
HCT: 39.1 % (ref 36.0–46.0)
Hemoglobin: 13.4 g/dL (ref 12.0–15.0)
Immature Granulocytes: 1 %
Lymphocytes Relative: 33 %
Lymphs Abs: 2.4 10*3/uL (ref 0.7–4.0)
MCH: 32.9 pg (ref 26.0–34.0)
MCHC: 34.3 g/dL (ref 30.0–36.0)
MCV: 96.1 fL (ref 80.0–100.0)
Monocytes Absolute: 0.5 10*3/uL (ref 0.1–1.0)
Monocytes Relative: 7 %
Neutro Abs: 3.9 10*3/uL (ref 1.7–7.7)
Neutrophils Relative %: 54 %
Platelets: 316 10*3/uL (ref 150–400)
RBC: 4.07 MIL/uL (ref 3.87–5.11)
RDW: 12.5 % (ref 11.5–15.5)
WBC: 7.1 10*3/uL (ref 4.0–10.5)
nRBC: 0 % (ref 0.0–0.2)

## 2021-06-24 LAB — BASIC METABOLIC PANEL
Anion gap: 9 (ref 5–15)
BUN: 22 mg/dL — ABNORMAL HIGH (ref 6–20)
CO2: 28 mmol/L (ref 22–32)
Calcium: 9.2 mg/dL (ref 8.9–10.3)
Chloride: 98 mmol/L (ref 98–111)
Creatinine, Ser: 0.86 mg/dL (ref 0.44–1.00)
GFR, Estimated: >60 mL/min (ref >60)
Glucose, Bld: 86 mg/dL (ref 70–99)
Potassium: 3.8 mmol/L (ref 3.5–5.1)
Sodium: 135 mmol/L (ref 135–145)

## 2021-06-24 NOTE — ED Notes (Signed)
Provider at bedside to review results.

## 2021-06-24 NOTE — ED Provider Notes (Signed)
[facility] EMERGENCY DEPT Provider Note   CSN: [id number] Arrival date & time: 06/24/21  1552     History  Chief Complaint  Patient presents with   Mass    Nichole Manning is a 56 y.o. female.  Patient with no pertinent past medical history presents today with chief compliant of chest mass. She states that she first noted the swelling on her left clavicle in early November. She got an ultrasound through her PCP in late November which she states read 'tumor, recommend MRI for further evaluation.' Unable to see these results in our system. She states that she has been unable to get scheduled for an MRI through her PCP office due to staffing issues, therefore she presented to the ER today for same.  No history of malignancy.  She is a former smoker.  The history is provided by the patient. No language interpreter was used.      Home Medications Prior to Admission medications   Medication Sig Start Date End Date Taking? Authorizing Provider  acetaminophen (TYLENOL) 325 MG tablet Take 2 tablets (650 mg total) by mouth every 6 (six) hours as needed for mild pain, fever or headache. 09/10/20   Norm Parcel, PA-C  albuterol (VENTOLIN HFA) 108 (90 Base) MCG/ACT inhaler Inhale into the lungs every 6 (six) hours as needed for wheezing or shortness of breath.    [provider]  ALPRAZolam Duanne Moron) 0.5 MG tablet Take 1 tablet (0.5 mg total) by mouth at bedtime as needed for anxiety. 07/02/13   Brien Few, MD  BEVESPI AEROSPHERE 9-4.8 MCG/ACT AERO Inhale 2 puffs into the lungs in the morning and at bedtime. 07/20/20   [provider]  Calcium Citrate 250 MG TABS Take 1 tablet by mouth daily. 07/13/20   [provider]  Clindamycin-Benzoyl Per, Refr, gel Apply 1 application topically daily. 08/27/20   [provider]  cyanocobalamin (,VITAMIN B-12,) 1000 MCG/ML injection Inject 1,000 mcg into the muscle every 28 (twenty-eight) days. 08/08/20    [provider]  levothyroxine (SYNTHROID) 88 MCG tablet Take 88 mcg by mouth every morning. 08/11/20   [provider]  Multiple Vitamin (MULTIVITAMIN WITH MINERALS) TABS tablet Take 1 tablet by mouth daily.    [provider]  NURTEC 75 MG TBDP Take 1 tablet by mouth every other day as needed (migraines). Patient not taking: Reported on 05/01/2021 07/27/20   [provider]  propranolol (INDERAL) 10 MG tablet Take 10 mg by mouth as needed (palpitations).    [provider]  spironolactone (ALDACTONE) 25 MG tablet Take 25 mg by mouth daily. 10/28/18   [provider]  traMADol (ULTRAM) 50 MG tablet Take 1 tablet (50 mg total) by mouth every 6 (six) hours as needed for moderate pain. Patient not taking: Reported on 03/10/2021 09/10/20   Norm Parcel, PA-C  vitamin C (ASCORBIC ACID) 500 MG tablet Take 500 mg by mouth daily.    [provider]      Allergies    Sulfa antibiotics, Adhesive [tape], and Codeine    Review of Systems   Review of Systems  Constitutional:  Negative for activity change, appetite change, chills, diaphoresis, fatigue, fever and unexpected weight change.  Respiratory:  Negative for cough, shortness of breath, wheezing and stridor.   Cardiovascular:  Negative for chest pain.  Gastrointestinal:  Negative for nausea and vomiting.  All other systems reviewed and are negative.  Physical Exam Updated Vital Signs BP 110/85 (BP Location:  Right Arm)    Pulse 72    Temp 98.4 F (36.9 C)    Resp 16    Ht 5\' 5"  (1.651 m)    Wt 63.5 kg    SpO2 97%    BMI 23.30 kg/m  Physical Exam Vitals and nursing note reviewed.  Constitutional:      General: She is not in acute distress.    Appearance: Normal appearance. She is normal weight. She is not ill-appearing, toxic-appearing or diaphoretic.     Comments: Patient resting comfortably in bed in no acute distress  HENT:     Head: Normocephalic and atraumatic.  Eyes:      Extraocular Movements: Extraocular movements intact.     Pupils: Pupils are equal, round, and reactive to light.  Cardiovascular:     Rate and Rhythm: Normal rate.  Pulmonary:     Effort: Pulmonary effort is normal. No respiratory distress.  Chest:       Comments: Tender soft nodule noted over the medial clavicle. No associated warmth or erythema. Nodule is not movable Abdominal:     General: Abdomen is flat.     Palpations: Abdomen is soft.  Musculoskeletal:        General: Normal range of motion.     Cervical back: Normal range of motion and neck supple.     Comments: 5/5 strength bilateral upper extremities with full ROM  Neurological:     Mental Status: She is alert.    ED Results / Procedures / Treatments   Labs (all labs ordered are listed, but only abnormal results are displayed) Labs Reviewed  BASIC METABOLIC PANEL - Abnormal; Notable for the following components:      Result Value   BUN 22 (*)    All other components within normal limits  CBC WITH DIFFERENTIAL/PLATELET    EKG None  Radiology [place] CHEST SOFT TISSUE  Result Date: 06/24/2021 CLINICAL DATA:  Mass adjacent to the medial left clavicle EXAM: ULTRASOUND OF CHEST SOFT TISSUES TECHNIQUE: Ultrasound examination of the chest wall soft tissues was performed in the area of clinical concern. COMPARISON:  None. FINDINGS: Targeted ultrasound of the medial clavicle is performed. In the region of concern is an oval hypoechoic mass measuring 1.2 x 0.9 x 1.6 cm along the inferior aspect of the medial clavicle. IMPRESSION: 1.6 cm hypoechoic mass at the medial left clavicle corresponding to palpable mass. This is indeterminate in appearance. Consider further evaluation with MRI. Electronically Signed   By: Donavan Foil M.D.   On: 06/24/2021 19:14    Procedures Procedures    Medications Ordered in ED Medications - No data to display  ED Course/ Medical Decision Making/ A&P                           Medical Decision  Making  Patient presents today with soft tissue swelling over the left clavicle over the past 2.5 months. Tender soft non-movable nodule noted in the area. She notes associated pain but denies shortness of breath or any other red flag symptoms such as weight loss, fevers, chills, or loss of appetite. Patient here requesting MRI of her clavicle. I have thoroughly discussed with patient that MRI is not available here, and that we only do MRIs in the ER in cases of emergency. I have recommended that she get her PCP to set up an outpatient MRI for further evaluation. Will get [place] of mass to evaluate for changes since  last imaging was 2 months ago and we do not have these results. Will also get labs to evaluate for WBC, RBC, and platelet changes that can be associated with malignancy.  I, Lavonna Rua, PA-C, personally reviewed and evaluated these images and lab results supported by medical decision making  Patients labs without leukocytosis, anemia, thrombocytopenia or any other acute findings.  Ultrasound reveals 1.6 cm hypoechoic mass the medial left clavicle recommends further evaluation with MRI.  Feel the patient meets criteria for outpatient management and follow-up with her PCP.  No emergent findings on labs, imaging, or physical exam to warrant further imaging today.  I have discussed the importance of scheduling an appointment for further management with her PCP.  She is understanding and amenable with plan.  She is afebrile, nontoxic-appearing, and in no acute distress with reassuring vital signs.  She is stable for discharge at this time.  Discharged in stable condition.  Findings and plan of care discussed with supervising physician Dr. Almyra Free who is in agreement.    Final Clinical Impression(s) / ED Diagnoses Final diagnoses:  Mass in chest    Rx / DC Orders ED Discharge Orders     None     An After Visit Summary was printed and given to the patient.     Nestor Lewandowsky 06/24/21 1955    Luna Fuse, MD 07/03/21 [phone]

## 2021-06-24 NOTE — ED Notes (Signed)
ED Provider at bedside. 

## 2021-06-24 NOTE — Discharge Instructions (Signed)
As we have discussed, your work-up in the ER this evening was reassuring for acute abnormalities.  Your ultrasound revealed a 1.6 cm hypoechoic mass above your clavicle with recommendation for further evaluation with MRI.  He will need to schedule this appointment through your PCP in an outpatient basis.  Return if development of any new or worsening symptoms.

## 2021-06-24 NOTE — ED Notes (Signed)
US at bedside

## 2021-06-24 NOTE — ED Triage Notes (Signed)
Patient here POV from Home with Mass.  Patient noticed Mass on Left Upper Chest in November. Patient had [place] completed and was told it was Tumor.  Painful at Times. No Fevers. No SOB.  NAD Noted during Triage. A&Ox4. GCS 15. Ambulatory.

## 2021-08-12 ENCOUNTER — Ambulatory Visit: Payer: Medicaid Other | Admitting: Family Medicine

## 2021-10-16 ENCOUNTER — Other Ambulatory Visit: Payer: Self-pay | Admitting: Orthopedic Surgery

## 2021-10-16 DIAGNOSIS — R2232 Localized swelling, mass and lump, left upper limb: Secondary | ICD-10-CM

## 2021-10-16 DIAGNOSIS — R2230 Localized swelling, mass and lump, unspecified upper limb: Secondary | ICD-10-CM

## 2021-10-17 ENCOUNTER — Other Ambulatory Visit: Payer: Self-pay | Admitting: Orthopedic Surgery

## 2021-10-18 ENCOUNTER — Ambulatory Visit: Payer: Medicaid Other | Admitting: Family Medicine

## 2021-11-08 ENCOUNTER — Ambulatory Visit
Admission: RE | Admit: 2021-11-08 | Discharge: 2021-11-08 | Disposition: A | Discharge status: Home / Self Care | Payer: Medicaid Other | Source: Ambulatory Visit | Attending: Orthopedic Surgery | Admitting: Orthopedic Surgery

## 2021-11-08 DIAGNOSIS — R2232 Localized swelling, mass and lump, left upper limb: Secondary | ICD-10-CM | POA: Diagnosis present

## 2021-11-08 DIAGNOSIS — R2230 Localized swelling, mass and lump, unspecified upper limb: Secondary | ICD-10-CM | POA: Insufficient documentation

## 2021-11-08 MED ORDER — GADOBUTROL 1 MMOL/ML IV SOLN
6.0000 mL | Freq: Once | INTRAVENOUS | Status: AC | PRN
Start: 2021-11-08 — End: 2021-11-08
  Administered 2021-11-08: 6 mL via INTRAVENOUS

## 2021-12-06 ENCOUNTER — Ambulatory Visit: Payer: Medicaid Other | Admitting: Family Medicine

## 2022-03-05 ENCOUNTER — Other Ambulatory Visit: Payer: Self-pay | Admitting: Adult Health

## 2022-03-05 DIAGNOSIS — Z1231 Encounter for screening mammogram for malignant neoplasm of breast: Secondary | ICD-10-CM

## 2022-04-04 ENCOUNTER — Ambulatory Visit
Admission: RE | Admit: 2022-04-04 | Discharge: 2022-04-04 | Disposition: A | Discharge status: Home / Self Care | Payer: Medicaid Other | Source: Ambulatory Visit | Attending: Adult Health | Admitting: Adult Health

## 2022-04-04 DIAGNOSIS — Z1231 Encounter for screening mammogram for malignant neoplasm of breast: Secondary | ICD-10-CM | POA: Diagnosis present

## 2022-04-08 ENCOUNTER — Other Ambulatory Visit: Payer: Self-pay | Admitting: Adult Health

## 2022-04-08 ENCOUNTER — Ambulatory Visit
Admission: EM | Admit: 2022-04-08 | Discharge: 2022-04-08 | Disposition: A | Discharge status: Home / Self Care | Payer: Medicaid Other | Attending: Emergency Medicine | Admitting: Emergency Medicine

## 2022-04-08 DIAGNOSIS — J029 Acute pharyngitis, unspecified: Secondary | ICD-10-CM | POA: Diagnosis not present

## 2022-04-08 DIAGNOSIS — R519 Headache, unspecified: Secondary | ICD-10-CM

## 2022-04-08 LAB — POCT RAPID STREP A (OFFICE): Rapid Strep A Screen: NEGATIVE

## 2022-04-08 MED ORDER — LIDOCAINE VISCOUS HCL 2 % MT SOLN: 15.0000 mL | OROMUCOSAL | 0 refills | Status: DC | PRN

## 2022-04-08 NOTE — Discharge Instructions (Addendum)
The strep test is negative.    Use the viscous lidocaine as directed.    Take Tylenol or ibuprofen as needed for fever or discomfort.      Follow-up with your primary care provider if your symptoms are not improving.

## 2022-04-08 NOTE — ED Triage Notes (Signed)
Patient to Urgent Care with complaints of sore throat and headache that started this morning. Denies any known fevers.   Has been taking Bc powders.

## 2022-04-08 NOTE — ED Provider Notes (Signed)
[name]    CSN: [id number] Arrival date & time: 04/08/22  1328      History   Chief Complaint Chief Complaint  Patient presents with   Sore Throat   Headache    HPI Nichole Manning is a 56 y.o. female.  Patient presents with sore throat and headache today.  Treatment at home with [facility] powder.  No fever, rash, cough, shortness of breath, vomiting, diarrhea, or other symptoms.  Her medical history includes COPD, chronic fatigue, hypothyroidism.   The history is provided by the patient and medical records.    Past Medical History:  Diagnosis Date   Anxiety    COPD (chronic obstructive pulmonary disease) (HCC)    Fibroids    Hypothyroidism    Palpitations    SVD (spontaneous vaginal delivery)    x 3    Patient Active Problem List   Diagnosis Date Noted   Appendicitis 09/09/2020   Acute appendicitis 09/09/2020   Tobacco use 11/16/2019   Chronic fatigue 10/10/2019   Chronic cough 10/10/2019   Anxiety and depression 10/10/2019   History of COVID-19 10/10/2019   Shortness of breath 10/10/2019   Brain fog 10/10/2019   Memory loss 10/10/2019   Heart palpitations 10/10/2019   Fibroids 07/01/2013   HYPOTHYROIDISM 07/25/2008   CONSTIPATION 07/25/2008   HEADACHE 07/25/2008    Past Surgical History:  Procedure Laterality Date   APPENDECTOMY     AUGMENTATION MAMMAPLASTY     BILATERAL SALPINGECTOMY Bilateral 07/01/2013   Procedure: BILATERAL SALPINGECTOMY;  Surgeon: Lovenia Kim, MD;  Location: [facility];  Service: Gynecology;  Laterality: Bilateral;   BREAST SURGERY     breast reduction x 2   LAPAROSCOPIC APPENDECTOMY N/A 09/09/2020   Procedure: APPENDECTOMY LAPAROSCOPIC;  Surgeon: Kinsinger, Arta Bruce, MD;  Location: [place] [facility];  Service: General;  Laterality: N/A;   TUBAL LIGATION     WISDOM TOOTH EXTRACTION      OB History   No obstetric history on file.      Home Medications    Prior to Admission medications   Medication Sig Start Date End  Date Taking? Authorizing Provider  lidocaine (XYLOCAINE) 2 % solution Use as directed 15 mLs in the mouth or throat as needed for mouth pain. 04/08/22  Yes Sharion Balloon, NP  acetaminophen (TYLENOL) 325 MG tablet Take 2 tablets (650 mg total) by mouth every 6 (six) hours as needed for mild pain, fever or headache. 09/10/20   Norm Parcel, PA-C  albuterol (VENTOLIN HFA) 108 (90 Base) MCG/ACT inhaler Inhale into the lungs every 6 (six) hours as needed for wheezing or shortness of breath.    [provider]  ALPRAZolam Duanne Moron) 0.5 MG tablet Take 1 tablet (0.5 mg total) by mouth at bedtime as needed for anxiety. 07/02/13   Brien Few, MD  BEVESPI AEROSPHERE 9-4.8 MCG/ACT AERO Inhale 2 puffs into the lungs in the morning and at bedtime. 07/20/20   [provider]  Calcium Citrate 250 MG TABS Take 1 tablet by mouth daily. 07/13/20   [provider]  Clindamycin-Benzoyl Per, Refr, gel Apply 1 application topically daily. 08/27/20   [provider]  cyanocobalamin (,VITAMIN B-12,) 1000 MCG/ML injection Inject 1,000 mcg into the muscle every 28 (twenty-eight) days. 08/08/20   [provider]  levothyroxine (SYNTHROID) 88 MCG tablet Take 88 mcg by mouth every morning. 08/11/20   [provider]  Multiple Vitamin (MULTIVITAMIN WITH MINERALS) TABS tablet Take 1 tablet by mouth daily.  [provider]  NURTEC 75 MG TBDP Take 1 tablet by mouth every other day as needed (migraines). Patient not taking: Reported on 05/01/2021 07/27/20   [provider]  propranolol (INDERAL) 10 MG tablet Take 10 mg by mouth as needed (palpitations).    [provider]  spironolactone (ALDACTONE) 25 MG tablet Take 25 mg by mouth daily. 10/28/18   [provider]  traMADol (ULTRAM) 50 MG tablet Take 1 tablet (50 mg total) by mouth every 6 (six) hours as needed for moderate pain. Patient not taking: Reported on 03/10/2021 09/10/20   Norm Parcel,  PA-C  vitamin C (ASCORBIC ACID) 500 MG tablet Take 500 mg by mouth daily.    [provider]    Family History Family History  Problem Relation Age of Onset   Breast cancer Mother 59   Cancer Father     Social History Social History   Tobacco Use   Smoking status: Former    Packs/day: 1.50    Years: 30.00    Total pack years: 45.00    Types: Cigarettes   Smokeless tobacco: Never   Tobacco comments:    20 cigarettes per day 11/16/19 [facility]J   Vaping Use   Vaping Use: Some days  Substance Use Topics   Alcohol use: Yes    Comment: social   Drug use: No     Allergies   Sulfa antibiotics, Adhesive [tape], and Codeine   Review of Systems Review of Systems  Constitutional:  Negative for chills and fever.  HENT:  Positive for sore throat. Negative for ear pain.   Respiratory:  Negative for cough and shortness of breath.   Cardiovascular:  Negative for chest pain and palpitations.  Gastrointestinal:  Negative for diarrhea and vomiting.  Skin:  Negative for rash.  Neurological:  Positive for headaches.  All other systems reviewed and are negative.    Physical Exam Triage Vital Signs ED Triage Vitals  Enc Vitals Group     BP      Pulse      Resp      Temp      Temp src      SpO2      Weight      Height      Head Circumference      Peak Flow      Pain Score      Pain Loc      Pain Edu?      Excl. in [place]?    No data found.  Updated Vital Signs BP 114/80   Pulse 80   Temp 98.4 F (36.9 C)   Resp 18   Ht '5\' 4"'$  (1.626 m)   Wt 123 lb (55.8 kg)   SpO2 98%   BMI 21.11 kg/m   Visual Acuity Right Eye Distance:   Left Eye Distance:   Bilateral Distance:    Right Eye Near:   Left Eye Near:    Bilateral Near:     Physical Exam Vitals and nursing note reviewed.  Constitutional:      General: She is not in acute distress.    Appearance: Normal appearance. She is well-developed. She is not ill-appearing.  HENT:     Right Ear: Tympanic membrane  normal.     Left Ear: Tympanic membrane normal.     Nose: Nose normal.     Mouth/Throat:     Mouth: Mucous membranes are moist.     Pharynx: Posterior oropharyngeal erythema  present.  Cardiovascular:     Rate and Rhythm: Normal rate and regular rhythm.     Heart sounds: Normal heart sounds.  Pulmonary:     Effort: Pulmonary effort is normal. No respiratory distress.     Breath sounds: Normal breath sounds.  Musculoskeletal:     Cervical back: Neck supple.  Skin:    General: Skin is warm and dry.  Neurological:     Mental Status: She is alert.  Psychiatric:        Mood and Affect: Mood normal.        Behavior: Behavior normal.      UC Treatments / Results  Labs (all labs ordered are listed, but only abnormal results are displayed) Labs Reviewed  POCT RAPID STREP A (OFFICE)    EKG   Radiology No results found.  Procedures Procedures (including critical care time)  Medications Ordered in UC Medications - No data to display  Initial Impression / Assessment and Plan / UC Course  I have reviewed the triage vital signs and the nursing notes.  Pertinent labs & imaging results that were available during my care of the patient were reviewed by me and considered in my medical decision making (see chart for details).   Viral pharyngitis, headache.  Rapid strep negative.  Patient declines COVID test.  Treating with viscous lidocaine.  Discussed other symptomatic treatment including Tylenol or ibuprofen.  Instructed patient to follow up with her PCP if her symptoms are not improving.  She agrees to plan of care.     Final Clinical Impressions(s) / UC Diagnoses   Final diagnoses:  Viral pharyngitis  Acute nonintractable headache, unspecified headache type     Discharge Instructions      The strep test is negative.    Use the viscous lidocaine as directed.    Take Tylenol or ibuprofen as needed for fever or discomfort.      Follow-up with your primary care  provider if your symptoms are not improving.         ED Prescriptions     Medication Sig Dispense Auth. Provider   lidocaine (XYLOCAINE) 2 % solution Use as directed 15 mLs in the mouth or throat as needed for mouth pain. 100 mL Sharion Balloon, NP      PDMP not reviewed this encounter.   Sharion Balloon, NP 04/08/22 1354

## 2022-04-11 ENCOUNTER — Ambulatory Visit (INDEPENDENT_AMBULATORY_CARE_PROVIDER_SITE_OTHER): Payer: Medicaid Other

## 2022-04-11 ENCOUNTER — Other Ambulatory Visit: Payer: Self-pay | Admitting: Adult Health

## 2022-04-11 ENCOUNTER — Ambulatory Visit
Admission: EM | Admit: 2022-04-11 | Discharge: 2022-04-11 | Disposition: A | Discharge status: Home / Self Care | Payer: Medicaid Other | Attending: Emergency Medicine | Admitting: Emergency Medicine

## 2022-04-11 DIAGNOSIS — J441 Chronic obstructive pulmonary disease with (acute) exacerbation: Secondary | ICD-10-CM | POA: Diagnosis not present

## 2022-04-11 DIAGNOSIS — R042 Hemoptysis: Secondary | ICD-10-CM

## 2022-04-11 DIAGNOSIS — R058 Other specified cough: Secondary | ICD-10-CM | POA: Diagnosis not present

## 2022-04-11 DIAGNOSIS — N63 Unspecified lump in unspecified breast: Secondary | ICD-10-CM

## 2022-04-11 MED ORDER — PREDNISONE 10 MG PO TABS: 40.0000 mg | ORAL_TABLET | Freq: Every day | ORAL | 0 refills | Status: AC

## 2022-04-11 MED ORDER — AZITHROMYCIN 250 MG PO TABS: 250.0000 mg | ORAL_TABLET | Freq: Every day | ORAL | 0 refills | Status: DC

## 2022-04-11 NOTE — ED Provider Notes (Signed)
[name]    CSN: [id number] Arrival date & time: 04/11/22  1358      History   Chief Complaint Chief Complaint  Patient presents with   Sore Throat    HPI Nichole Manning is a 56 y.o. female.  Patient presents with 4-day history of sore throat and headache.  She has developed ear pain, congestion, cough productive of yellow sputum with occasional blood-tinged sputum.  Former smoker.  No fever, rash, shortness of breath, vomiting, diarrhea, or other symptoms.  Treatment at home with Tylenol and BC powder. Patient was seen here on 04/08/2022; diagnosed with viral pharyngitis and headache; treated symptomatically; strep negative.  Her medical history includes COPD, chronic cough, chronic fatigue, hypothyroidism.   The history is provided by the patient and medical records.    Past Medical History:  Diagnosis Date   Anxiety    COPD (chronic obstructive pulmonary disease) (HCC)    Fibroids    Hypothyroidism    Palpitations    SVD (spontaneous vaginal delivery)    x 3    Patient Active Problem List   Diagnosis Date Noted   Appendicitis 09/09/2020   Acute appendicitis 09/09/2020   Tobacco use 11/16/2019   Chronic fatigue 10/10/2019   Chronic cough 10/10/2019   Anxiety and depression 10/10/2019   History of COVID-19 10/10/2019   Shortness of breath 10/10/2019   Brain fog 10/10/2019   Memory loss 10/10/2019   Heart palpitations 10/10/2019   Fibroids 07/01/2013   HYPOTHYROIDISM 07/25/2008   CONSTIPATION 07/25/2008   HEADACHE 07/25/2008    Past Surgical History:  Procedure Laterality Date   APPENDECTOMY     AUGMENTATION MAMMAPLASTY     BILATERAL SALPINGECTOMY Bilateral 07/01/2013   Procedure: BILATERAL SALPINGECTOMY;  Surgeon: Lovenia Kim, MD;  Location: [facility];  Service: Gynecology;  Laterality: Bilateral;   BREAST SURGERY     breast reduction x 2   LAPAROSCOPIC APPENDECTOMY N/A 09/09/2020   Procedure: APPENDECTOMY LAPAROSCOPIC;  Surgeon:  Kinsinger, Arta Bruce, MD;  Location: [place];  Service: General;  Laterality: N/A;   TUBAL LIGATION     WISDOM TOOTH EXTRACTION      OB History   No obstetric history on file.      Home Medications    Prior to Admission medications   Medication Sig Start Date End Date Taking? Authorizing Provider  azithromycin (ZITHROMAX) 250 MG tablet Take 1 tablet (250 mg total) by mouth daily. Take first 2 tablets together, then 1 every day until finished. 04/11/22  Yes Sharion Balloon, NP  predniSONE (DELTASONE) 10 MG tablet Take 4 tablets (40 mg total) by mouth daily for 5 days. 04/11/22 04/16/22 Yes Sharion Balloon, NP  acetaminophen (TYLENOL) 325 MG tablet Take 2 tablets (650 mg total) by mouth every 6 (six) hours as needed for mild pain, fever or headache. 09/10/20   Norm Parcel, PA-C  albuterol (VENTOLIN HFA) 108 (90 Base) MCG/ACT inhaler Inhale into the lungs every 6 (six) hours as needed for wheezing or shortness of breath.    [provider]  ALPRAZolam Duanne Moron) 0.5 MG tablet Take 1 tablet (0.5 mg total) by mouth at bedtime as needed for anxiety. 07/02/13   Brien Few, MD  BEVESPI AEROSPHERE 9-4.8 MCG/ACT AERO Inhale 2 puffs into the lungs in the morning and at bedtime. 07/20/20   [provider]  Calcium Citrate 250 MG TABS Take 1 tablet by mouth daily. 07/13/20   [provider]  Clindamycin-Benzoyl Per, Refr, gel  Apply 1 application topically daily. 08/27/20   [provider]  cyanocobalamin (,VITAMIN B-12,) 1000 MCG/ML injection Inject 1,000 mcg into the muscle every 28 (twenty-eight) days. 08/08/20   [provider]  levothyroxine (SYNTHROID) 88 MCG tablet Take 88 mcg by mouth every morning. 08/11/20   [provider]  lidocaine (XYLOCAINE) 2 % solution Use as directed 15 mLs in the mouth or throat as needed for mouth pain. 04/08/22   Sharion Balloon, NP  Multiple Vitamin (MULTIVITAMIN WITH MINERALS) TABS tablet Take 1 tablet by mouth daily.     [provider]  NURTEC 75 MG TBDP Take 1 tablet by mouth every other day as needed (migraines). Patient not taking: Reported on 05/01/2021 07/27/20   [provider]  propranolol (INDERAL) 10 MG tablet Take 10 mg by mouth as needed (palpitations).    [provider]  spironolactone (ALDACTONE) 25 MG tablet Take 25 mg by mouth daily. 10/28/18   [provider]  traMADol (ULTRAM) 50 MG tablet Take 1 tablet (50 mg total) by mouth every 6 (six) hours as needed for moderate pain. Patient not taking: Reported on 03/10/2021 09/10/20   Norm Parcel, PA-C  vitamin C (ASCORBIC ACID) 500 MG tablet Take 500 mg by mouth daily.    [provider]    Family History Family History  Problem Relation Age of Onset   Breast cancer Mother 42   Cancer Father     Social History Social History   Tobacco Use   Smoking status: Former    Packs/day: 1.50    Years: 30.00    Total pack years: 45.00    Types: Cigarettes   Smokeless tobacco: Never   Tobacco comments:    20 cigarettes per day 11/16/19 [facility]J   Vaping Use   Vaping Use: Some days  Substance Use Topics   Alcohol use: Yes    Comment: social   Drug use: No     Allergies   Sulfa antibiotics, Adhesive [tape], and Codeine   Review of Systems Review of Systems  Constitutional:  Negative for chills and fever.  HENT:  Positive for congestion, ear pain, postnasal drip, rhinorrhea, sinus pressure and sore throat.   Respiratory:  Positive for cough. Negative for shortness of breath.   Cardiovascular:  Negative for chest pain and palpitations.  Gastrointestinal:  Negative for diarrhea and vomiting.  Skin:  Negative for rash.  Neurological:  Positive for headaches.  All other systems reviewed and are negative.    Physical Exam Triage Vital Signs ED Triage Vitals  Enc Vitals Group     BP 04/11/22 1429 120/86     Pulse Rate 04/11/22 1423 (!) 109     Resp 04/11/22 1423 18     Temp 04/11/22 1423 97.9  F (36.6 C)     Temp src --      SpO2 04/11/22 1423 98 %     Weight 04/11/22 1428 123 lb (55.8 kg)     Height 04/11/22 1428 '5\' 4"'$  (1.626 m)     Head Circumference --      Peak Flow --      Pain Score 04/11/22 1428 9     Pain Loc --      Pain Edu? --      Excl. in [place]? --    No data found.  Updated Vital Signs BP 120/86   Pulse (!) 109   Temp 97.9 F (36.6 C)   Resp 18   Ht  $'5\' 4"'j$  (1.626 m)   Wt 123 lb (55.8 kg)   SpO2 98%   BMI 21.11 kg/m   Visual Acuity Right Eye Distance:   Left Eye Distance:   Bilateral Distance:    Right Eye Near:   Left Eye Near:    Bilateral Near:     Physical Exam Vitals and nursing note reviewed.  Constitutional:      General: She is not in acute distress.    Appearance: Normal appearance. She is well-developed. She is not ill-appearing.  HENT:     Right Ear: Tympanic membrane normal.     Left Ear: Tympanic membrane normal.     Nose: Congestion and rhinorrhea present.     Mouth/Throat:     Mouth: Mucous membranes are moist.     Pharynx: Oropharynx is clear.  Cardiovascular:     Rate and Rhythm: Normal rate and regular rhythm.     Heart sounds: Normal heart sounds.  Pulmonary:     Effort: Pulmonary effort is normal. No respiratory distress.     Breath sounds: Normal breath sounds.  Musculoskeletal:     Cervical back: Neck supple.  Skin:    General: Skin is warm and dry.  Neurological:     Mental Status: She is alert.  Psychiatric:        Mood and Affect: Mood normal.        Behavior: Behavior normal.      UC Treatments / Results  Labs (all labs ordered are listed, but only abnormal results are displayed) Labs Reviewed - No data to display  EKG   Radiology DG Chest 2 View  Result Date: 04/11/2022 CLINICAL DATA:  Productive cough.  Hemoptysis.  Fatigue. EXAM: CHEST - 2 VIEW COMPARISON:  10/10/2019 FINDINGS: The heart size and mediastinal contours are within normal limits. Both lungs are clear. The visualized skeletal  structures are unremarkable. Bilateral breast implants again noted. IMPRESSION: No active cardiopulmonary disease. Electronically Signed   By: Marlaine Hind M.D.   On: 04/11/2022 14:56    Procedures Procedures (including critical care time)  Medications Ordered in UC Medications - No data to display  Initial Impression / Assessment and Plan / UC Course  I have reviewed the triage vital signs and the nursing notes.  Pertinent labs & imaging results that were available during my care of the patient were reviewed by me and considered in my medical decision making (see chart for details).    Hemoptysis, productive cough, COPD exacerbation.  Chest x-ray clear.  Treating today with prednisone and Zithromax.  Instructed patient to continue albuterol as prescribed.  Instructed her to follow-up with her PCP on Monday.  Education provided on COPD exacerbation and hemoptysis.  ED precautions given.  Patient agrees to plan of care.  Final Clinical Impressions(s) / UC Diagnoses   Final diagnoses:  Hemoptysis  Productive cough  COPD exacerbation ([place])     Discharge Instructions      Take the prednisone and Zithromax as directed.  Follow up with your primary care provider on Monday.        ED Prescriptions     Medication Sig Dispense Auth. Provider   predniSONE (DELTASONE) 10 MG tablet Take 4 tablets (40 mg total) by mouth daily for 5 days. 20 tablet Sharion Balloon, NP   azithromycin (ZITHROMAX) 250 MG tablet Take 1 tablet (250 mg total) by mouth daily. Take first 2 tablets together, then 1 every day until finished. 6 tablet Sharion Balloon, NP  PDMP not reviewed this encounter.   Sharion Balloon, NP 04/11/22 1505

## 2022-04-11 NOTE — Discharge Instructions (Addendum)
Take the prednisone and Zithromax as directed.  Follow up with your primary care provider on Monday.

## 2022-04-11 NOTE — ED Triage Notes (Signed)
Patient to Urgent Care with complaints of sore throat, fatigue, and headaches that started 10/31. Reports cough is productive with green phlegm, reports at times she has noticed some blood. No known fevers.   Has been taking tylenol and headache powders.   Negative covid test at home.

## 2022-04-15 ENCOUNTER — Other Ambulatory Visit: Payer: Self-pay | Admitting: Adult Health

## 2022-04-15 ENCOUNTER — Ambulatory Visit
Admission: RE | Admit: 2022-04-15 | Discharge: 2022-04-15 | Disposition: A | Discharge status: Home / Self Care | Payer: Medicaid Other | Source: Ambulatory Visit | Attending: Adult Health | Admitting: Adult Health

## 2022-04-15 DIAGNOSIS — N63 Unspecified lump in unspecified breast: Secondary | ICD-10-CM | POA: Diagnosis present

## 2022-04-22 ENCOUNTER — Other Ambulatory Visit: Payer: Medicaid Other

## 2022-07-03 NOTE — Progress Notes (Addendum)
Cardiology Office Note    Date:  07/04/2022   ID:  Nichole, Manning Oct 26, 1965, MRN [id number]  PCP:  Franciso Bend, NP  Cardiologist:  Jodelle Red, MD  Electrophysiologist:  None   Chief Complaint: abnormal EKG  History of Present Illness:   Nichole Manning is a 57 y.o. female with history of anxiety, COPD, tobacco abuse, hypothyroidism followed by endocrinology, mild MR, rare PSVT/PAC/PVC seen for evaluation.  Per prior cardiology notes she had had prior echo with mild LVH and mild MR but I do not have a copy of this report. She was previously seen for palpitations as well. Monitor 01/2020 55-144bpm, avg 79bpm, predominantly NSR, one 5 beat SVT, rare <1% PACs, PVCs, 2 triggered events with sinus with PVC. Lipids are managed by primary care.  She is seen back for follow-up today as she went to see PCP for pre-op evaluation for elective blepharoplasty and was found to have abnormal EKG. The patient requested follow-up here for evaluation. The EKG obtained from the PCP visit showed NSR 83bpm frequent PVCs, nonspecific STTW changes otherwise in precordial leads. Her EKG here shows NSR with resolution of these STTW changes anteriorly and no further ectopy. She reports occasional palpitations about 1x/week, not particularly bothersome. Propranolol helps but doesn't take often. She has noticed occasional dyspnea with exertion or showering. No CP or syncope. Her thyroid is followed by endocrinology with labwork just 2 weeks ago per her report therefore we wont repeat. She had labs at PCP on 1/24 with Hgb 13.6, plt wnl, K 4.2, Cr 0.9, LFTs wnl.  Labwork independently reviewed: Pt reported labs above from portal, see HP 06/2021 K 3.8, Cr 0.86, CBC wnl 11/2020 LFTS ok, LDL 83, trig 147 (PCP), TSH 0.880 with elevated FT4   Past History   Past Medical History:  Diagnosis Date   Anxiety    COPD (chronic obstructive pulmonary disease) (HCC)    Fibroids    Hypothyroidism     Palpitations    SVD (spontaneous vaginal delivery)    x 3    Past Surgical History:  Procedure Laterality Date   APPENDECTOMY     AUGMENTATION MAMMAPLASTY     BILATERAL SALPINGECTOMY Bilateral 07/01/2013   Procedure: BILATERAL SALPINGECTOMY;  Surgeon: Lenoard Aden, MD;  Location: [facility];  Service: Gynecology;  Laterality: Bilateral;   BREAST SURGERY     breast reduction x 2   LAPAROSCOPIC APPENDECTOMY N/A 09/09/2020   Procedure: APPENDECTOMY LAPAROSCOPIC;  Surgeon: Kinsinger, De Blanch, MD;  Location: [facility];  Service: General;  Laterality: N/A;   TUBAL LIGATION     WISDOM TOOTH EXTRACTION      Current Medications: Current Meds  Medication Sig   acetaminophen (TYLENOL) 325 MG tablet Take 2 tablets (650 mg total) by mouth every 6 (six) hours as needed for mild pain, fever or headache.   albuterol (VENTOLIN HFA) 108 (90 Base) MCG/ACT inhaler Inhale into the lungs every 6 (six) hours as needed for wheezing or shortness of breath.   ALPRAZolam (XANAX) 0.5 MG tablet Take 1 tablet (0.5 mg total) by mouth at bedtime as needed for anxiety.   azithromycin (ZITHROMAX) 250 MG tablet Take 1 tablet (250 mg total) by mouth daily. Take first 2 tablets together, then 1 every day until finished.   BEVESPI AEROSPHERE 9-4.8 MCG/ACT AERO Inhale 2 puffs into the lungs in the morning and at bedtime.   Calcium Citrate 250 MG TABS Take 1 tablet by mouth daily.   Clindamycin-Benzoyl  Per, Refr, gel Apply 1 application topically daily.   cyanocobalamin (,VITAMIN B-12,) 1000 MCG/ML injection Inject 1,000 mcg into the muscle every 28 (twenty-eight) days.   levothyroxine (SYNTHROID) 88 MCG tablet Take 88 mcg by mouth every morning.   lidocaine (XYLOCAINE) 2 % solution Use as directed 15 mLs in the mouth or throat as needed for mouth pain.   Multiple Vitamin (MULTIVITAMIN WITH MINERALS) TABS tablet Take 1 tablet by mouth daily.   NURTEC 75 MG TBDP Take 1 tablet by mouth every other day as needed (migraines).    propranolol (INDERAL) 10 MG tablet Take 10 mg by mouth as needed (palpitations).   spironolactone (ALDACTONE) 25 MG tablet Take 25 mg by mouth daily.   traMADol (ULTRAM) 50 MG tablet Take 1 tablet (50 mg total) by mouth every 6 (six) hours as needed for moderate pain.   vitamin C (ASCORBIC ACID) 500 MG tablet Take 500 mg by mouth daily.      Allergies:   Sulfa antibiotics, Adhesive [tape], and Codeine   Social History   Socioeconomic History   Marital status: Married    Spouse name: Not on file   Number of children: Not on file   Years of education: Not on file   Highest education level: Not on file  Occupational History   Not on file  Tobacco Use   Smoking status: Former    Packs/day: 1.50    Years: 30.00    Total pack years: 45.00    Types: Cigarettes   Smokeless tobacco: Never   Tobacco comments:    20 cigarettes per day 11/16/19 ARJ   Vaping Use   Vaping Use: Some days  Substance and Sexual Activity   Alcohol use: Yes    Comment: social   Drug use: No   Sexual activity: Yes    Birth control/protection: Surgical  Other Topics Concern   Not on file  Social History Narrative   Leave with son   2-story home   Left handed   2-cup cofee   Social Determinants of Health   Financial Resource Strain: Not on file  Food Insecurity: Not on file  Transportation Needs: Not on file  Physical Activity: Not on file  Stress: Not on file  Social Connections: Not on file     Family History:  The patient's family history includes Breast cancer (age of onset: 40) in her mother; Cancer in her father.  ROS:   Please see the history of present illness. All other systems are reviewed and otherwise negative.    EKG(s)/Additional Testing   EKG:  EKG is ordered today, personally reviewed, demonstrating NSR 81bpm, possible LAE, TWI V1-V2 with RSR pattern but normal QRS duration, no further STTW changes in V3-V4.  EKG from PCP 07/02/22 - NSR 83bpm frequent PVCs, nonspecific STTW  changes in precordial leads with TWI V1-V2 and biphasic apperance in V3, slightly in V4.   CV Studies: Cardiac studies reviewed are outlined and summarized above. Otherwise please see EMR for full report.  Recent Labs: No results found for requested labs within last 365 days.  Recent Lipid Panel No results found for: "CHOL", "TRIG", "HDL", "CHOLHDL", "VLDL", "LDLCALC", "LDLDIRECT"  PHYSICAL EXAM:    VS:  BP 108/60   Pulse 81   Ht 5\' 4"  (1.626 m)   Wt 126 lb (57.2 kg)   SpO2 96%   BMI 21.63 kg/m   BMI: Body mass index is 21.63 kg/m.  GEN: Well nourished, well developed female in no  acute distress HEENT: normocephalic, atraumatic Neck: no JVD, carotid bruits, or masses Cardiac: RRR; no murmurs, rubs, or gallops, no edema  Respiratory:  clear to auscultation bilaterally, normal work of breathing GI: soft, nontender, nondistended, + BS MS: no deformity or atrophy Skin: warm and dry, no rash Neuro:  Alert and Oriented x 3, Strength and sensation are intact, follows commands Psych: euthymic mood, full affect  Wt Readings from Last 3 Encounters:  07/04/22 126 lb (57.2 kg)  04/11/22 123 lb (55.8 kg)  04/08/22 123 lb (55.8 kg)     ASSESSMENT & PLAN:   1. PVCs - PVCs are not totally new for patient but was in trigeminy day of EKG. Recent potassium WNL. Thyroid being managed by endocrinology. Will check magnesium and update 2D echocardiogram. Obtain 3 day Zio to quantify burden. Hold off empiric rx pending results as she indicates she does not want to take medicine if she does not have to.  2. Abnormal EKG - EKG @ PCP office NSR 83bpm frequent PVCs, nonspecific STTW changes in precordial leads with TWI V1-V2 and biphasic apperance in V3, slightly in V4. Unclear if clinically significant or related to lead placement. She is also describing some atypical dyspnea. Given PVCs, symptoms, EKG, need for pre-op clearance, we will obtain coronary CTA for completeness. (She does not want to have  stress test.) We did discuss that if she is having significant ectopy that day they may have difficulty obtaining the study but my hope is that we can complete it - she is not having any PVCs on exam today. Addendum: post visit, pt inquired about whether OSA could be contributing. She does not snore but reports poor sleep with fatigue, waking up with headache and sore throat. Would recommend Itamar sleep study if patient's insurance will allow.  3. Pre-operative evaluation - surgery is not until March per patient, elective. I told her she can have their office fax [place] a clearance request so we know where to send her info to when all procedures are complete. If her testing above is reassuring I think she can have her surgery.  4. Mild mitral regurgitation - patient is unsure where the prior echo may have been done. Will update given PVCs.  5. Hypothyroidism - as above, followed by endocrinologist, hold off on recheck as patient reports recent evaluation.     Disposition: F/u with APP in February after testing. I am in [place] much of Feb so may need to be colleague.   Medication Adjustments/Labs and Tests Ordered: Current medicines are reviewed at length with the patient today.  Concerns regarding medicines are outlined above. Medication changes, Labs and Tests ordered today are summarized above and listed in the Patient Instructions accessible in Encounters.   Signed, Laurann Montana, PA-C  07/04/2022 2:14 PM     [facility] Phone: [phone]; Fax: [phone]

## 2022-07-04 ENCOUNTER — Encounter: Payer: Self-pay | Admitting: Physician Assistant

## 2022-07-04 ENCOUNTER — Ambulatory Visit: Payer: Medicaid Other | Attending: Physician Assistant

## 2022-07-04 ENCOUNTER — Ambulatory Visit: Payer: Medicaid Other | Attending: Physician Assistant | Admitting: Physician Assistant

## 2022-07-04 VITALS — BP 108/60 | HR 81 | Ht 64.0 in | Wt 126.0 lb

## 2022-07-04 DIAGNOSIS — I493 Ventricular premature depolarization: Secondary | ICD-10-CM

## 2022-07-04 DIAGNOSIS — I340 Nonrheumatic mitral (valve) insufficiency: Secondary | ICD-10-CM | POA: Diagnosis not present

## 2022-07-04 DIAGNOSIS — R9431 Abnormal electrocardiogram [ECG] [EKG]: Secondary | ICD-10-CM

## 2022-07-04 DIAGNOSIS — E039 Hypothyroidism, unspecified: Secondary | ICD-10-CM | POA: Diagnosis not present

## 2022-07-04 DIAGNOSIS — R0600 Dyspnea, unspecified: Secondary | ICD-10-CM

## 2022-07-04 DIAGNOSIS — R002 Palpitations: Secondary | ICD-10-CM

## 2022-07-04 MED ORDER — METOPROLOL TARTRATE 100 MG PO TABS: ORAL_TABLET | ORAL | 0 refills | Status: DC

## 2022-07-04 NOTE — Patient Instructions (Addendum)
Medication Instructions:  Your physician recommends that you continue on your current medications as directed. Please refer to the Current Medication list given to you today.  *If you need a refill on your cardiac medications before your next appointment, please call your pharmacy*   Lab Work: Mag today If you have labs (blood work) drawn today and your tests are completely normal, you will receive your results only by: [place] (if you have MyChart) OR A paper copy in the mail If you have any lab test that is abnormal or we need to change your treatment, we will call you to review the results.   Testing/Procedures: Your physician has requested that you have an echocardiogram. Echocardiography is a painless test that uses sound waves to create images of your heart. It provides your doctor with information about the size and shape of your heart and how well your heart's chambers and valves are working. This procedure takes approximately one hour. There are no restrictions for this procedure. Please do NOT wear cologne, perfume, aftershave, or lotions (deodorant is allowed). Please arrive 15 minutes prior to your appointment time.   ZIO XT- Long Term Monitor Instructions  Your physician has requested you wear a ZIO patch monitor for 3 days.  This is a single patch monitor. [employer] supplies one patch monitor per enrollment. Additional stickers are not available. Please do not apply patch if you will be having a Nuclear Stress Test,  Echocardiogram, Cardiac CT, MRI, or Chest Xray during the period you would be wearing the  monitor. The patch cannot be worn during these tests. You cannot remove and re-apply the  ZIO XT patch monitor.  Your ZIO patch monitor will be mailed 3 day USPS to your address on file. It may take 3-5 days  to receive your monitor after you have been enrolled.  Once you have received your monitor, please review the enclosed instructions. Your monitor  has already  been registered assigning a specific monitor serial # to you.  Billing and Patient Assistance Program Information  We have supplied [employer] with any of your insurance information on file for billing purposes. [employer] offers a sliding scale Patient Assistance Program for patients that do not have  insurance, or whose insurance does not completely cover the cost of the ZIO monitor.  You must apply for the Patient Assistance Program to qualify for this discounted rate.  To apply, please call [employer] at [phone], select option 4, select option 2, ask to apply for  Patient Assistance Program. Theodore Demark will ask your household income, and how many people  are in your household. They will quote your out-of-pocket cost based on that information.  [employer] will also be able to set up a 75-month interest-free payment plan if needed.  Applying the monitor   Shave hair from upper left chest.  Hold abrader disc by orange tab. Rub abrader in 40 strokes over the upper left chest as  indicated in your monitor instructions.  Clean area with 4 enclosed alcohol pads. Let dry.  Apply patch as indicated in monitor instructions. Patch will be placed under collarbone on left  side of chest with arrow pointing upward.  Rub patch adhesive wings for 2 minutes. Remove white label marked "1". Remove the white  label marked "2". Rub patch adhesive wings for 2 additional minutes.  While looking in a mirror, press and release button in center of patch. A small green light will  flash 3-4 times. This will be your only  indicator that the monitor has been turned on.  Do not shower for the first 24 hours. You may shower after the first 24 hours.  Press the button if you feel a symptom. You will hear a small click. Record Date, Time and  Symptom in the Patient Logbook.  When you are ready to remove the patch, follow instructions on the last 2 pages of Patient  Logbook. Stick patch monitor onto the last page of Patient  Logbook.  Place Patient Logbook in the blue and white box. Use locking tab on box and tape box closed  securely. The blue and white box has prepaid postage on it. Please place it in the mailbox as  soon as possible. Your physician should have your test results approximately 7 days after the  monitor has been mailed back to [facility].  Call [place] at [phone] if you have questions regarding  your ZIO XT patch monitor. Call them immediately if you see an orange light blinking on your  monitor.  If your monitor falls off in less than 4 days, contact our Monitor department at [phone].  If your monitor becomes loose or falls off after 4 days call [employer] at [phone] for  suggestions on securing your monitor     Your cardiac CT will be scheduled at one of the below locations:   [facility] [address] [place], [place] [address] (336) [place] [address] [place], [place] [address] [phone]  [facility] [place], [place] [address] [phone]  If scheduled at [facility], please arrive at the [facility] and Children's Entrance (Entrance C2) of [facility] 30 minutes prior to test start time. You can use the FREE valet parking offered at entrance C (encouraged to control the heart rate for the test)  Proceed to the [facility] Radiology Department (first floor) to check-in and test prep.  All radiology patients and guests should use entrance C2 at [facility], accessed from [facility], even though the hospital's physical address listed is [address].    If scheduled at [facility] or [facility], please arrive 15 mins early for check-in and test prep.   Please follow these instructions carefully (unless otherwise  directed):  Hold all erectile dysfunction medications at least 3 days (72 hrs) prior to test. (Ie viagra, cialis, sildenafil, tadalafil, etc) We will administer nitroglycerin during this exam.   On the Night Before the Test: Be sure to Drink plenty of water. Do not consume any caffeinated/decaffeinated beverages or chocolate 12 hours prior to your test. Do not take any antihistamines 12 hours prior to your test. If the patient has contrast allergy: Patient will need a prescription for Prednisone and very clear instructions (as follows): Prednisone 50 mg - take 13 hours prior to test Take another Prednisone 50 mg 7 hours prior to test Take another Prednisone 50 mg 1 hour prior to test Take Benadryl 50 mg 1 hour prior to test Patient must complete all four doses of above prophylactic medications. Patient will need a ride after test due to Benadryl.  On the Day of the Test: Drink plenty of water until 1 hour prior to the test. Do not eat any food 1 hour prior to test. You may take your regular medications prior to the test.  Take metoprolol (Lopressor) 100  mg two hours prior to test. HOLD Furosemide/Hydrochlorothiazide morning of the test. FEMALES- please wear underwire-free bra if available, avoid dresses & tight clothing        After the Test: Drink plenty of water. After receiving IV contrast, you may experience a mild flushed feeling. This is normal. On occasion, you may experience a mild rash up to 24 hours after the test. This is not dangerous. If this occurs, you can take Benadryl 25 mg and increase your fluid intake. If you experience trouble breathing, this can be serious. If it is severe call 911 IMMEDIATELY. If it is mild, please call our office. If you take any of these medications: Glipizide/Metformin, Avandament, Glucavance, please do not take 48 hours after completing test unless otherwise instructed.  We will call to schedule your test 2-4 weeks out understanding that  some insurance companies will need an authorization prior to the service being performed.   For non-scheduling related questions, please contact the cardiac imaging nurse navigator should you have any questions/concerns: Marchia Bond, Cardiac Imaging Nurse Navigator Gordy Clement, Cardiac Imaging Nurse Navigator [facility] Direct Office Dial: [phone]   For scheduling needs, including cancellations and rescheduling, please call [place], [phone].    Follow-Up: At [facility], you and your health needs are our priority.  As part of our continuing mission to provide you with exceptional heart care, we have created designated Provider Care Teams.  These Care Teams include your primary Cardiologist (physician) and Advanced Practice Providers (APPs -  Physician Assistants and Nurse Practitioners) who all work together to provide you with the care you need, when you need it.   Your next appointment:   End of February  Provider:   Nicholes Rough, PA-C, Melina Copa, PA-C, Ambrose Pancoast, NP, Ermalinda Barrios, PA-C, Christen Bame, NP, or Richardson Dopp, PA-C

## 2022-07-04 NOTE — Progress Notes (Unsigned)
Applied a 3 day Zio XT monitor to patient in the office  Dr Harrell Gave to read

## 2022-07-05 LAB — MAGNESIUM: Magnesium: 2.2 mg/dL (ref 1.6–2.3)

## 2022-07-07 ENCOUNTER — Telehealth (HOSPITAL_COMMUNITY): Payer: Self-pay | Admitting: *Deleted

## 2022-07-07 NOTE — Telephone Encounter (Signed)
Reaching out to patient to offer assistance regarding upcoming cardiac imaging study; pt verbalizes understanding of appt date/time, parking situation and where to check in, pre-test NPO status and medications ordered, and verified current allergies; name and call back number provided for further questions should they arise  Gordy Clement RN Navigator Cardiac Imaging [facility] [phone] office [phone] cell  Patient to take '100mg'$  metoprolol tartrate two hours prior to her cardiac CT scan. She is aware to arrive 11:30am.

## 2022-07-08 ENCOUNTER — Ambulatory Visit (HOSPITAL_COMMUNITY)
Admission: RE | Admit: 2022-07-08 | Discharge: 2022-07-08 | Disposition: A | Discharge status: Home / Self Care | Payer: Medicaid Other | Source: Ambulatory Visit | Attending: Physician Assistant | Admitting: Physician Assistant

## 2022-07-08 ENCOUNTER — Encounter (HOSPITAL_COMMUNITY): Payer: Self-pay

## 2022-07-08 DIAGNOSIS — I493 Ventricular premature depolarization: Secondary | ICD-10-CM

## 2022-07-08 DIAGNOSIS — R0600 Dyspnea, unspecified: Secondary | ICD-10-CM

## 2022-07-08 MED ORDER — NITROGLYCERIN 0.4 MG SL SUBL: 0.8000 mg | SUBLINGUAL_TABLET | Freq: Once | SUBLINGUAL | Status: DC

## 2022-07-08 NOTE — Progress Notes (Signed)
Pt for CT Coronary study. Premedicated with Metoprolol '100mg'$ . BP 104/70. HR 80's. Frequent PVCs. Dr. Marlou Porch notified. Scan canceled. He will reach out to the team to determine plan of care.

## 2022-07-09 ENCOUNTER — Telehealth: Payer: Self-pay | Admitting: Physician Assistant

## 2022-07-09 DIAGNOSIS — R519 Headache, unspecified: Secondary | ICD-10-CM

## 2022-07-09 DIAGNOSIS — I493 Ventricular premature depolarization: Secondary | ICD-10-CM

## 2022-07-09 NOTE — Telephone Encounter (Signed)
   I received notification from Dr. Marlou Porch that they were unable to complete the cor CT scan due to frequent PVCs unfortunately. We had discussed at her OV this might be the case. CT was pursued as she did not wish to do a stress test. Since we aren't able to complete the coronary CTA, I called her to discuss next steps but got VM. Left message for her to call back. When she returns call, would please let her know that I recommend we await her echocardiogram to help guide next steps. If her LVEF is normal I would suggest to revisit whether she would consider a stress test but if her EF is down or monitor shows any sustained rhythm problems, may need additional cath workup. I'm hoping for the former but we'll take this one step at a time.

## 2022-07-09 NOTE — Telephone Encounter (Signed)
I spoke with the pt and we went over [facility] Dunn's note and recommendations.. the pt says she feels comfortable with the plan... she is feeling well today.. no palpitations or dizziness. She is asking if OSA can be contributing to her symptoms. She has never been told that she snores or has sleep apnea but she does not get restful sleep and wakes up in the middle of the night. She wakes up often with headache and sore throat.   I will forward to [facility] for her review.

## 2022-07-09 NOTE — Addendum Note (Signed)
Addended by: Stephani Police on: 07/09/2022 12:39 PM   Modules accepted: Orders

## 2022-07-09 NOTE — Telephone Encounter (Signed)
Pt advised and will wait to hear about Itamar once we have a chance to precertify.

## 2022-07-09 NOTE — Telephone Encounter (Signed)
We do see OSA contributing to heart rhythm abnormalities. We can go ahead an order an Itamar study under the dx PVCs and headaches and see if insurance will cover this.

## 2022-07-15 NOTE — Telephone Encounter (Addendum)
Prior Authorization for [facility] sent to [facility] via Phone. Reference # . READY-Prior Authorization is not required-Decision ID #: [id number] The prior authorization/notification reference number is: [id number].

## 2022-07-18 ENCOUNTER — Ambulatory Visit (HOSPITAL_COMMUNITY): Payer: Medicaid Other | Attending: Cardiology

## 2022-07-18 DIAGNOSIS — I493 Ventricular premature depolarization: Secondary | ICD-10-CM | POA: Diagnosis not present

## 2022-07-18 LAB — ECHOCARDIOGRAM COMPLETE
Area-P 1/2: 4.06 cm2
Calc EF: 56.1 %
S' Lateral: 2.3 cm
Single Plane A2C EF: 54.8 %
Single Plane A4C EF: 55.5 %

## 2022-07-18 NOTE — Telephone Encounter (Signed)
I left a message for the pt to set up Itamar sleep study. Pt is coming in today at 11:15 for an echo. I left a message to let the echo tech know she needs to see Arbie Cookey for home sleep study before she leaves the office today. I sent a message to echo scheduler to see if she can help let the echo tech know that I need to see the pt when they are done.

## 2022-07-18 NOTE — Telephone Encounter (Signed)
Sleep Apnea Evaluation  [facility]  Today's Date: 07/18/2022   Patient Name: Nichole Manning        DOB: Apr 16, 1966       Height:        Weight:    BMI: There is no height or weight on file to calculate BMI.    Referring Provider:  DR. Radford Pax   STOP-BANG RISK ASSESSMENT         If STOP-BANG Score ?3 OR two clinical symptoms - patient qualifies for WatchPAT (CPT 95800)      Sleep study ordered due to two (2) of the following clinical symptoms/diagnoses:  Excessive daytime sleepiness G47.10  Gastroesophageal reflux K21.9  Nocturia R35.1  Morning Headaches G44.221  Difficulty concentrating R41.840  Memory problems or poor judgment G31.84  Personality changes or irritability R45.4  Loud snoring R06.83  Depression F32.9  Unrefreshed by sleep G47.8  Impotence N52.9  History of high blood pressure R03.0  Insomnia G47.00  Sleep Disordered Breathing or Sleep Apnea ICD G47.33

## 2022-07-18 NOTE — Telephone Encounter (Signed)
Pt was came by the office today for her echo appt and set up of Itamar study. Pt has been approved and has been given PIN# [id number]. APP has been uploaded on pt's phone today while in the office. Pt will do study by early next week. Pt thanked me for the help today.   Called and made the patient aware that she may proceed with the [facility] Sleep Study. PIN # provided to the patient. Patient made aware that she will be contacted after the test has been read with the results and any recommendations. Patient verbalized understanding and thanked me for the call.

## 2022-07-21 ENCOUNTER — Encounter (INDEPENDENT_AMBULATORY_CARE_PROVIDER_SITE_OTHER): Payer: Medicaid Other | Admitting: Cardiology

## 2022-07-21 ENCOUNTER — Telehealth: Payer: Self-pay | Admitting: Cardiology

## 2022-07-21 DIAGNOSIS — R0683 Snoring: Secondary | ICD-10-CM

## 2022-07-21 NOTE — Telephone Encounter (Signed)
Patient states she is returning a call from today.

## 2022-07-21 NOTE — Telephone Encounter (Signed)
Nichole Pitter, PA-C 07/20/2022  2:17 PM EST     I tried to call pt to discuss normal echo but got VM. Did not leave message since it was the weekend. Please call patient and let her know that event monitor preliminary read shows 2.2% PVCs. I'm awaiting the final report for the monitor but her echo looks good. I wanted to find out if she would be willing to consider stress test given the PVCs and dyspnea on exertion (since unable to complete cor CT due to ectopy). I would offer ETT to start. If she did not want to exercise on the treadmill, would offer Lexiscan nuclear stress test as next option. At recent visit she did not want to pursue a stress test so just find out if she is willing to reconsider and I can call to consent.    The patient has been notified of the result and verbalized understanding.  All questions (if any) were answered. Nichole Amass, RN 07/21/2022 8:49 AM   Patient states that she would like to think about her options for the stress test. She will let us know how she would like to proceed.

## 2022-07-26 ENCOUNTER — Ambulatory Visit: Payer: Medicaid Other | Attending: Internal Medicine

## 2022-07-26 DIAGNOSIS — R519 Headache, unspecified: Secondary | ICD-10-CM

## 2022-07-26 DIAGNOSIS — I493 Ventricular premature depolarization: Secondary | ICD-10-CM

## 2022-07-26 NOTE — Procedures (Signed)
   SLEEP STUDY REPORT Patient Information Study Date: 07/21/2022 Patient Name: Nichole Manning Patient ID: [id number] Birth Date: Mar 30, 1966 Age: 57 Gender: Female BMI: 21.5 (W=126 lb, H=5' 4'') Stopbang: 3 Referring Physician: Fransico Him, MD  TEST DESCRIPTION: Home sleep apnea testing was completed using the WatchPat, a Type 1 device, utilizing peripheral arterial tonometry (PAT), chest movement, actigraphy, pulse oximetry, pulse rate, body position and snore. AHI was calculated with apnea and hypopnea using valid sleep time as the denominator. RDI includes apneas, hypopneas, and RERAs. The data acquired and the scoring of sleep and all associated events were performed in accordance with the recommended standards and specifications as outlined in the AASM Manual for the Scoring of Sleep and Associated Events 2.2.0 (2015).   FINDINGS: 1.  No evidence of Obstructive Sleep Apnea with AHI 0/hr.  2.  No Central Sleep Apnea. 3.  Oxygen desaturations as low as 90%. 4.  Mild to moderate snoring was present. O2 sats were < 88% for 0 minutes. 5.  Total sleep time was 6 hrs and 45 min. 6.  16.2% of total sleep time was spent in REM sleep.  7.   Prolonged sleep onset latency at 36  min.  8.   Shortened REM sleep onset latency at 77 min.  9.  Total awakenings were 9.   DIAGNOSIS:  Normal study with no significant sleep disordered breathing.  RECOMMENDATIONS:   1. Normal study with no significant sleep disordered breathing.  2.  Healthy sleep recommendations include:  adequate nightly sleep (normal 7-9 hrs/night), avoidance of caffeine after noon and alcohol near bedtime, and maintaining a sleep environment that is cool, dark and quiet.  3.  Weight loss for overweight patients is recommended.    4.  Snoring recommendations include:  weight loss where appropriate, side sleeping, and avoidance of alcohol before bed.  5.  Operation of motor vehicle or dangerous equipment must be avoided when  feeling drowsy, excessively sleepy, or mentally fatigued.    6.  An ENT consultation which may be useful for specific causes of and possible treatment of bothersome snoring.   7. Weight loss may be of benefit in reducing the severity of snoring.   Signature: Fransico Him, MD; [facility]; [place], [place] Board of Sleep Medicine Electronically Signed: 07/26/2022

## 2022-07-30 ENCOUNTER — Telehealth: Payer: Self-pay | Admitting: *Deleted

## 2022-07-30 NOTE — Telephone Encounter (Signed)
The patient has been notified of the result and verbalized understanding.  All questions (if any) were answered. Marolyn Hammock, CMA 07/30/2022 6:00 PM     Pt is agreeable to normal results.

## 2022-07-30 NOTE — Telephone Encounter (Signed)
-----   Message from Lauralee Evener, [place] sent at 07/28/2022  8:42 AM EST -----  ----- Message ----- From: Sueanne Margarita, MD Sent: 07/26/2022   9:37 AM EST To: [facility] Sleep Studies  Please let patient know that sleep study showed no significant sleep apnea.

## 2022-07-30 NOTE — Progress Notes (Unsigned)
Office Visit    Patient Name: Nichole Manning Date of Encounter: 07/31/2022  PCP:  Gae Bon, NP   [place]  Cardiologist:  Buford Dresser, MD  Advanced Practice Provider:  No care team member to display Electrophysiologist:  None }  HPI    Nichole Manning is a 57 y.o. female with a past medical history of anxiety, COPD, hypothyroidism, palpitations, PSVT, PAC, PVC, mild MR presents today for follow-up appointment.  She was last seen in the clinic by Melina Copa, PA-C 07/04/2022.  History of mild LVH and mild MR on echocardiogram.  Seen for palpitations as well.  Monitor 8/21 with heart rate 55 to 144 bpm, average 79 bpm, predominantly NSR, 1 5 beat SVT, rare less than 1% PACs, PVCs, 2 triggered events with sinus with PVCs.  Lipids managed by primary care.  When she was last seen it was for a preop evaluation for elective blepharoplasty.  She was regularly seen by PCP and found to have an abnormal EKG.  EKG from the PCP visit showed normal sinus rhythm 83 bpm with frequent PVCs, nonspecific ST TW changes otherwise in precordial leads.  EKG at our appointment showed normal sinus rhythm with resolution of ST TW changes anteriorly and no further activity.  She reports occasional palpitations about 1 time per week but not particular bothersome.  Propranolol helps but she does not take this often.  Noticed occasional dyspnea with exertion or showering.  No chest pain or syncope.  Thyroid function is followed by endocrinology with lab work just 2 weeks prior therefore they were not repeated.  Today, she states she mailed her zio in a few weeks ago and has not heard anything. We reviewed her monitor with her. She has been on spirolactone for years for acne. She smoked for 40 years and quit in 2020. She has a 44 year old child and is worried about heart disease. She does not know her family history since she is adopted. We discussed the need for an ischemic  workup. She was to have a coronary CTA but she had too much ectopy to complete the test.   Reports no shortness of breath nor dyspnea on exertion. Reports no chest pain, pressure, or tightness. No edema, orthopnea, PND.   Past Medical History    Past Medical History:  Diagnosis Date   Anxiety    COPD (chronic obstructive pulmonary disease) (HCC)    Fibroids    Hypothyroidism    Palpitations    SVD (spontaneous vaginal delivery)    x 3   Past Surgical History:  Procedure Laterality Date   APPENDECTOMY     AUGMENTATION MAMMAPLASTY     BILATERAL SALPINGECTOMY Bilateral 07/01/2013   Procedure: BILATERAL SALPINGECTOMY;  Surgeon: Lovenia Kim, MD;  Location: [facility];  Service: Gynecology;  Laterality: Bilateral;   BREAST SURGERY     breast reduction x 2   LAPAROSCOPIC APPENDECTOMY N/A 09/09/2020   Procedure: APPENDECTOMY LAPAROSCOPIC;  Surgeon: Kinsinger, Arta Bruce, MD;  Location: [place] [facility];  Service: General;  Laterality: N/A;   TUBAL LIGATION     WISDOM TOOTH EXTRACTION      Allergies  Allergies  Allergen Reactions   Sulfa Antibiotics Hives   Adhesive [Tape] Itching    Rxn was from [place].   Codeine Nausea And Vomiting    EKGs/Labs/Other Studies Reviewed:   The following studies were reviewed today:  Echocardiogram 07/18/2022 IMPRESSIONS     1. Left ventricular  ejection fraction, by estimation, is 60 to 65%. The  left ventricle has normal function. The left ventricle has no regional  wall motion abnormalities. Left ventricular diastolic parameters were  normal.   2. Right ventricular systolic function is normal. The right ventricular  size is normal.   3. The mitral valve is normal in structure. Trivial mitral valve  regurgitation. No evidence of mitral stenosis.   4. The aortic valve is tricuspid. Aortic valve regurgitation is not  visualized. No aortic stenosis is present.   5. The inferior vena cava is normal in size with greater than 50%  respiratory  variability, suggesting right atrial pressure of 3 mmHg.   Comparison(s): No prior Echocardiogram.   FINDINGS   Left Ventricle: Left ventricular ejection fraction, by estimation, is 60  to 65%. The left ventricle has normal function. The left ventricle has no  regional wall motion abnormalities. The left ventricular internal cavity  size was normal in size. There is   no left ventricular hypertrophy. Left ventricular diastolic parameters  were normal.   Right Ventricle: The right ventricular size is normal. Right ventricular  systolic function is normal.   Left Atrium: Left atrial size was normal in size.   Right Atrium: Right atrial size was normal in size.   Pericardium: There is no evidence of pericardial effusion.   Mitral Valve: The mitral valve is normal in structure. Trivial mitral  valve regurgitation. No evidence of mitral valve stenosis.   Tricuspid Valve: The tricuspid valve is normal in structure. Tricuspid  valve regurgitation is trivial. No evidence of tricuspid stenosis.   Aortic Valve: The aortic valve is tricuspid. Aortic valve regurgitation is  not visualized. No aortic stenosis is present.   Pulmonic Valve: The pulmonic valve was normal in structure. Pulmonic valve  regurgitation is trivial. No evidence of pulmonic stenosis.   Aorta: The aortic root is normal in size and structure.   Venous: The inferior vena cava is normal in size with greater than 50%  respiratory variability, suggesting right atrial pressure of 3 mmHg.   IAS/Shunts: No atrial level shunt detected by color flow Doppler.    EKG:  EKG is not ordered today.    Recent Labs: 07/04/2022: Magnesium 2.2  Recent Lipid Panel No results found for: "CHOL", "TRIG", "HDL", "CHOLHDL", "VLDL", "LDLCALC", "LDLDIRECT"  Home Medications   Current Meds  Medication Sig   acetaminophen (TYLENOL) 325 MG tablet Take 2 tablets (650 mg total) by mouth every 6 (six) hours as needed for mild pain, fever or  headache.   albuterol (VENTOLIN HFA) 108 (90 Base) MCG/ACT inhaler Inhale into the lungs every 6 (six) hours as needed for wheezing or shortness of breath.   ALPRAZolam (XANAX) 0.5 MG tablet Take 1 tablet (0.5 mg total) by mouth at bedtime as needed for anxiety.   BEVESPI AEROSPHERE 9-4.8 MCG/ACT AERO Inhale 2 puffs into the lungs in the morning and at bedtime.   Calcium Citrate 250 MG TABS Take 1 tablet by mouth daily.   Clindamycin-Benzoyl Per, Refr, gel Apply 1 application topically daily.   cyanocobalamin (,VITAMIN B-12,) 1000 MCG/ML injection Inject 1,000 mcg into the muscle every 28 (twenty-eight) days.   levothyroxine (SYNTHROID) 88 MCG tablet Take 88 mcg by mouth every morning.   lidocaine (XYLOCAINE) 2 % solution Use as directed 15 mLs in the mouth or throat as needed for mouth pain.   metoprolol tartrate (LOPRESSOR) 100 MG tablet Take one tablet by mouth two hours prior to CT.  Multiple Vitamin (MULTIVITAMIN WITH MINERALS) TABS tablet Take 1 tablet by mouth daily.   NURTEC 75 MG TBDP Take 1 tablet by mouth every other day as needed (migraines).   propranolol (INDERAL) 10 MG tablet Take 10 mg by mouth as needed (palpitations).   spironolactone (ALDACTONE) 25 MG tablet Take 25 mg by mouth daily.   traMADol (ULTRAM) 50 MG tablet Take 1 tablet (50 mg total) by mouth every 6 (six) hours as needed for moderate pain.   vitamin C (ASCORBIC ACID) 500 MG tablet Take 500 mg by mouth daily.     Review of Systems      All other systems reviewed and are otherwise negative except as noted above.  Physical Exam    VS:  BP 112/72   Pulse 87   Ht 5' 4"$  (1.626 m)   Wt 128 lb 6.4 oz (58.2 kg)   SpO2 100%   BMI 22.04 kg/m  , BMI Body mass index is 22.04 kg/m.  Wt Readings from Last 3 Encounters:  07/31/22 128 lb 6.4 oz (58.2 kg)  07/04/22 126 lb (57.2 kg)  04/11/22 123 lb (55.8 kg)     GEN: Well nourished, well developed, in no acute distress. HEENT: normal. Neck: Supple, no JVD,  carotid bruits, or masses. Cardiac: RRR, no murmurs, rubs, or gallops. No clubbing, cyanosis, edema.  Radials/PT 2+ and equal bilaterally.  Respiratory:  Respirations regular and unlabored, clear to auscultation bilaterally. GI: Soft, nontender, nondistended. MS: No deformity or atrophy. Skin: Warm and dry, no rash. Neuro:  Strength and sensation are intact. Psych: Normal affect.  Assessment & Plan    PVCs/palpitations -discuss stress test and she agreed to a treadmill -not a candidate for coronary CTA due to ectopy from frequent PVCs -reviewed recent labs and monitor with the patient -would consider adding BB and d/c spirolactone or minoxidil (she takes this for hair growth)  -preop clearance dependent on stress test results  Mitral valve regurg (mild) -continue to monitor with annual echos  Dyspnea -only associated with palpitations        Disposition: Follow up 3 months with Buford Dresser, MD or APP.  Signed, Elgie Collard, PA-C 07/31/2022, 10:27 PM [facility]

## 2022-07-31 ENCOUNTER — Ambulatory Visit: Payer: Medicaid Other | Attending: Physician Assistant | Admitting: Physician Assistant

## 2022-07-31 ENCOUNTER — Encounter: Payer: Self-pay | Admitting: *Deleted

## 2022-07-31 ENCOUNTER — Encounter: Payer: Self-pay | Admitting: Physician Assistant

## 2022-07-31 VITALS — BP 112/72 | HR 87 | Ht 64.0 in | Wt 128.4 lb

## 2022-07-31 DIAGNOSIS — I340 Nonrheumatic mitral (valve) insufficiency: Secondary | ICD-10-CM

## 2022-07-31 DIAGNOSIS — I493 Ventricular premature depolarization: Secondary | ICD-10-CM

## 2022-07-31 DIAGNOSIS — R0600 Dyspnea, unspecified: Secondary | ICD-10-CM

## 2022-07-31 DIAGNOSIS — R002 Palpitations: Secondary | ICD-10-CM

## 2022-07-31 NOTE — Patient Instructions (Signed)
Medication Instructions:  Your physician recommends that you continue on your current medications as directed. Please refer to the Current Medication list given to you today.  *If you need a refill on your cardiac medications before your next appointment, please call your pharmacy*   Lab Work: None ordered If you have labs (blood work) drawn today and your tests are completely normal, you will receive your results only by: [place] (if you have MyChart) OR A paper copy in the mail If you have any lab test that is abnormal or we need to change your treatment, we will call you to review the results.   Testing/Procedures: Your physician has requested that you have an exercise tolerance test. For further information please visit [email or web address].    Follow-Up: At [facility], you and your health needs are our priority.  As part of our continuing mission to provide you with exceptional heart care, we have created designated Provider Care Teams.  These Care Teams include your primary Cardiologist (physician) and Advanced Practice Providers (APPs -  Physician Assistants and Nurse Practitioners) who all work together to provide you with the care you need, when you need it.   Your next appointment:   3 month(s)  Provider:   Buford Dresser, MD  or Melina Copa, PA-C

## 2022-08-05 ENCOUNTER — Ambulatory Visit: Payer: Medicaid Other | Attending: Physician Assistant

## 2022-08-05 DIAGNOSIS — I493 Ventricular premature depolarization: Secondary | ICD-10-CM | POA: Diagnosis not present

## 2022-08-05 LAB — EXERCISE TOLERANCE TEST
Angina Index: 0
Estimated workload: 9.2
Exercise duration (min): 7 min
Exercise duration (sec): 30 s
MPHR: 163 {beats}/min
Peak HR: 166 {beats}/min
Percent HR: 101 %
RPE: 15
Rest HR: 86 {beats}/min

## 2022-09-18 ENCOUNTER — Ambulatory Visit (INDEPENDENT_AMBULATORY_CARE_PROVIDER_SITE_OTHER): Payer: Medicaid Other | Admitting: Dermatology

## 2022-09-18 VITALS — BP 118/77

## 2022-09-18 DIAGNOSIS — L578 Other skin changes due to chronic exposure to nonionizing radiation: Secondary | ICD-10-CM | POA: Diagnosis not present

## 2022-09-18 DIAGNOSIS — L821 Other seborrheic keratosis: Secondary | ICD-10-CM

## 2022-09-18 DIAGNOSIS — L820 Inflamed seborrheic keratosis: Secondary | ICD-10-CM

## 2022-09-18 NOTE — Progress Notes (Signed)
   New Patient Visit   Subjective  Nichole Manning is a 57 y.o. female who presents for the following: check spot R lower leg, noticed ~55m ago, no symptoms The patient has spots, moles and lesions to be evaluated, some may be new or changing and the patient has concerns that these could be cancer.   The following portions of the chart were reviewed this encounter and updated as appropriate: medications, allergies, medical history  Review of Systems:  No other skin or systemic complaints except as noted in HPI or Assessment and Plan.  Objective  Well appearing patient in no apparent distress; mood and affect are within normal limits.  A focused examination was performed of the following areas: Bil lower legs Relevant exam findings are noted in the Assessment and Plan.   Assessment & Plan   SEBORRHEIC KERATOSIS - Stuck-on, waxy, tan-brown papules and/or plaques  - Benign-appearing - Discussed benign etiology and prognosis. - Observe - Call for any changes - legs  INFLAMED SEBORRHEIC KERATOSIS Exam: Erythematous keratotic or waxy stuck-on papule or plaque.  Symptomatic, irritating, patient would like treated.  Benign-appearing.  Call clinic for new or changing lesions.   Prior to procedure, discussed risks of blister formation, small wound, skin dyspigmentation, or rare scar following treatment. Recommend Vaseline ointment to treated areas while healing.  Destruction Procedure Note Destruction method: cryotherapy   Informed consent: discussed and consent obtained   Lesion destroyed using liquid nitrogen: Yes   Outcome: patient tolerated procedure well with no complications   Post-procedure details: wound care instructions given   Locations: right lower leg x 1 # of Lesions Treated: 1  ACTINIC DAMAGE - chronic, secondary to cumulative UV radiation exposure/sun exposure over time - diffuse scaly erythematous macules with underlying dyspigmentation - Recommend daily broad  spectrum sunscreen SPF 30+ to sun-exposed areas, reapply every 2 hours as needed.  - Recommend staying in the shade or wearing long sleeves, sun glasses (UVA+UVB protection) and wide brim hats (4-inch brim around the entire circumference of the hat). - Call for new or changing lesions.   Return if symptoms worsen or fail to improve.  I, Ardis Rowan, RMA, am acting as scribe for Armida Sans, MD .  Documentation: I have reviewed the above documentation for accuracy and completeness, and I agree with the above.  Armida Sans, MD

## 2022-09-18 NOTE — Patient Instructions (Addendum)

## 2022-09-25 NOTE — Telephone Encounter (Signed)
Patient last seen by you, I do not believe is cardiac related but feel free to give your input

## 2022-09-28 ENCOUNTER — Ambulatory Visit
Admission: RE | Admit: 2022-09-28 | Discharge: 2022-09-28 | Disposition: A | Discharge status: Home / Self Care | Payer: Medicaid Other | Source: Ambulatory Visit | Attending: Adult Health | Admitting: Adult Health

## 2022-09-28 VITALS — HR 83 | Temp 97.8°F | Resp 18

## 2022-09-28 DIAGNOSIS — R61 Generalized hyperhidrosis: Secondary | ICD-10-CM | POA: Diagnosis not present

## 2022-09-28 NOTE — ED Provider Notes (Signed)
[facility]    CSN: [id number] Arrival date & time: 09/28/22  1155      History   Chief Complaint Chief Complaint  Patient presents with   Foot Pain    excessive sweating soles of feet and causing inflammation and pain - Entered by patient    HPI Nichole Manning is a 57 y.o. female.  Patient presents with excessive sweating on the palms of her hands and soles of her feet x several weeks.  Her feet sweat so much that they are wrinkled as if she had been in water.  They are painful when she walks.  Treatment attempted with antifungal shampoo that was previously prescribed for another issue.  No trauma.  No fever, open wound, numbness, weakness, or other symptoms.  Her medical history includes hypothyroidism, COPD, chronic cough, chronic fatigue, anxiety, depression.  The history is provided by the patient and medical records.    Past Medical History:  Diagnosis Date   Anxiety    COPD (chronic obstructive pulmonary disease)    Fibroids    Hypothyroidism    Palpitations    SVD (spontaneous vaginal delivery)    x 3    Patient Active Problem List   Diagnosis Date Noted   Appendicitis 09/09/2020   Acute appendicitis 09/09/2020   Tobacco use 11/16/2019   Chronic fatigue 10/10/2019   Chronic cough 10/10/2019   Anxiety and depression 10/10/2019   History of COVID-19 10/10/2019   Shortness of breath 10/10/2019   Brain fog 10/10/2019   Memory loss 10/10/2019   Heart palpitations 10/10/2019   Fibroids 07/01/2013   HYPOTHYROIDISM 07/25/2008   CONSTIPATION 07/25/2008   HEADACHE 07/25/2008    Past Surgical History:  Procedure Laterality Date   APPENDECTOMY     AUGMENTATION MAMMAPLASTY     BILATERAL SALPINGECTOMY Bilateral 07/01/2013   Procedure: BILATERAL SALPINGECTOMY;  Surgeon: Lenoard Aden, MD;  Location: [facility];  Service: Gynecology;  Laterality: Bilateral;   BREAST SURGERY     breast reduction x 2   LAPAROSCOPIC APPENDECTOMY N/A 09/09/2020   Procedure:  APPENDECTOMY LAPAROSCOPIC;  Surgeon: Kinsinger, De Blanch, MD;  Location: [facility];  Service: General;  Laterality: N/A;   TUBAL LIGATION     WISDOM TOOTH EXTRACTION      OB History   No obstetric history on file.      Home Medications    Prior to Admission medications   Medication Sig Start Date End Date Taking? Authorizing Provider  acetaminophen (TYLENOL) 325 MG tablet Take 2 tablets (650 mg total) by mouth every 6 (six) hours as needed for mild pain, fever or headache. 09/10/20   Juliet Rude, PA-C  albuterol (VENTOLIN HFA) 108 (90 Base) MCG/ACT inhaler Inhale into the lungs every 6 (six) hours as needed for wheezing or shortness of breath.    [provider]  ALPRAZolam Prudy Feeler) 0.5 MG tablet Take 1 tablet (0.5 mg total) by mouth at bedtime as needed for anxiety. 07/02/13   Olivia Mackie, MD  BEVESPI AEROSPHERE 9-4.8 MCG/ACT AERO Inhale 2 puffs into the lungs in the morning and at bedtime. 07/20/20   [provider]  Calcium Citrate 250 MG TABS Take 1 tablet by mouth daily. 07/13/20   [provider]  Clindamycin-Benzoyl Per, Refr, gel Apply 1 application topically daily. 08/27/20   [provider]  cyanocobalamin (,VITAMIN B-12,) 1000 MCG/ML injection Inject 1,000 mcg into the muscle every 28 (twenty-eight) days. 08/08/20   [provider]  levothyroxine (SYNTHROID) 88  MCG tablet Take 88 mcg by mouth every morning. 08/11/20   [provider]  lidocaine (XYLOCAINE) 2 % solution Use as directed 15 mLs in the mouth or throat as needed for mouth pain. 04/08/22   Mickie Bail, NP  metoprolol tartrate (LOPRESSOR) 100 MG tablet Take one tablet by mouth two hours prior to CT. 07/04/22   Dunn, Tacey Ruiz, PA-C  Multiple Vitamin (MULTIVITAMIN WITH MINERALS) TABS tablet Take 1 tablet by mouth daily.    [provider]  NURTEC 75 MG TBDP Take 1 tablet by mouth every other day as needed (migraines). 07/27/20   [provider]   propranolol (INDERAL) 10 MG tablet Take 10 mg by mouth as needed (palpitations).    [provider]  spironolactone (ALDACTONE) 25 MG tablet Take 25 mg by mouth daily. 10/28/18   [provider]  traMADol (ULTRAM) 50 MG tablet Take 1 tablet (50 mg total) by mouth every 6 (six) hours as needed for moderate pain. 09/10/20   Juliet Rude, PA-C  vitamin C (ASCORBIC ACID) 500 MG tablet Take 500 mg by mouth daily.    [provider]    Family History Family History  Problem Relation Age of Onset   Breast cancer Mother 67   Cancer Father     Social History Social History   Tobacco Use   Smoking status: Former    Packs/day: 1.50    Years: 30.00    Additional pack years: 0.00    Total pack years: 45.00    Types: Cigarettes   Smokeless tobacco: Never   Tobacco comments:    20 cigarettes per day 11/16/19 [facility]   Vaping Use   Vaping Use: Some days  Substance Use Topics   Alcohol use: Yes    Comment: social   Drug use: No     Allergies   Sulfa antibiotics, Adhesive [tape], and Codeine   Review of Systems Review of Systems  Constitutional:  Negative for chills and fever.  Musculoskeletal:  Positive for gait problem. Negative for joint swelling.  Skin:  Positive for color change. Negative for wound.  Neurological:  Negative for weakness and numbness.     Physical Exam Triage Vital Signs ED Triage Vitals [09/28/22 1300]  Enc Vitals Group     BP      Pulse Rate 83     Resp 18     Temp 97.8 F (36.6 C)     Temp src      SpO2 95 %     Weight      Height      Head Circumference      Peak Flow      Pain Score      Pain Loc      Pain Edu?      Excl. in GC?    No data found.  Updated Vital Signs Pulse 83   Temp 97.8 F (36.6 C)   Resp 18   SpO2 95%   Visual Acuity Right Eye Distance:   Left Eye Distance:   Bilateral Distance:    Right Eye Near:   Left Eye Near:    Bilateral Near:     Physical Exam Constitutional:      General:  She is not in acute distress.    Appearance: Normal appearance. She is not ill-appearing.  HENT:     Mouth/Throat:     Mouth: Mucous membranes are moist.  Cardiovascular:     Rate and Rhythm:  Normal rate and regular rhythm.  Pulmonary:     Effort: Pulmonary effort is normal. No respiratory distress.  Musculoskeletal:        General: No swelling or deformity. Normal range of motion.  Skin:    General: Skin is warm.     Findings: No bruising or lesion.     Comments: Palms have no rash, wounds, erythema, sweating. Bilateral soles of feet diaphoretic, mild erythematous. No wounds. 2+ pedal pulses, FROM, strength 5/5, sensation intact.    Neurological:     General: No focal deficit present.     Mental Status: She is alert and oriented to person, place, and time.     Sensory: No sensory deficit.     Motor: No weakness.     Gait: Gait normal.  Psychiatric:        Mood and Affect: Mood normal.        Behavior: Behavior normal.      UC Treatments / Results  Labs (all labs ordered are listed, but only abnormal results are displayed) Labs Reviewed - No data to display  EKG   Radiology No results found.  Procedures Procedures (including critical care time)  Medications Ordered in UC Medications - No data to display  Initial Impression / Assessment and Plan / UC Course  I have reviewed the triage vital signs and the nursing notes.  Pertinent labs & imaging results that were available during my care of the patient were reviewed by me and considered in my medical decision making (see chart for details).   Excessive sweating on soles of feet.  Unable to take blood pressure today as patient recently had skin removal surgery on both upper arms and has sutures.  She is well-appearing and her exam is reassuring.  Afebrile.  Discussed possible causes of excessive sweating.  Instructed her to monitor for fungal infection.  Instructed her to follow-up with a dermatologist.  She agrees to  plan of care.   Final Clinical Impressions(s) / UC Diagnoses   Final diagnoses:  Excessive sweating     Discharge Instructions      Follow up with a dermatologist.      ED Prescriptions   None    PDMP not reviewed this encounter.   Mickie Bail, NP 09/28/22 [phone]

## 2022-09-28 NOTE — Discharge Instructions (Signed)
Follow up with a dermatologist.

## 2022-10-05 ENCOUNTER — Encounter: Payer: Self-pay | Admitting: Dermatology

## 2022-10-10 ENCOUNTER — Telehealth: Payer: Self-pay | Admitting: Cardiology

## 2022-10-10 ENCOUNTER — Emergency Department (HOSPITAL_COMMUNITY)
Admission: EM | Admit: 2022-10-10 | Discharge: 2022-10-10 | Disposition: A | Discharge status: Home / Self Care | Payer: Medicaid Other | Attending: Emergency Medicine | Admitting: Emergency Medicine

## 2022-10-10 ENCOUNTER — Emergency Department (HOSPITAL_COMMUNITY): Payer: Medicaid Other

## 2022-10-10 DIAGNOSIS — Z79899 Other long term (current) drug therapy: Secondary | ICD-10-CM | POA: Insufficient documentation

## 2022-10-10 DIAGNOSIS — J449 Chronic obstructive pulmonary disease, unspecified: Secondary | ICD-10-CM | POA: Insufficient documentation

## 2022-10-10 DIAGNOSIS — R29810 Facial weakness: Secondary | ICD-10-CM | POA: Insufficient documentation

## 2022-10-10 LAB — CBC
HCT: 41 % (ref 36.0–46.0)
Hemoglobin: 13.5 g/dL (ref 12.0–15.0)
MCH: 31.6 pg (ref 26.0–34.0)
MCHC: 32.9 g/dL (ref 30.0–36.0)
MCV: 96 fL (ref 80.0–100.0)
Platelets: 230 10*3/uL (ref 150–400)
RBC: 4.27 MIL/uL (ref 3.87–5.11)
RDW: 12.3 % (ref 11.5–15.5)
WBC: 6.3 10*3/uL (ref 4.0–10.5)
nRBC: 0 % (ref 0.0–0.2)

## 2022-10-10 LAB — COMPREHENSIVE METABOLIC PANEL
ALT: 19 U/L (ref 0–44)
AST: 21 U/L (ref 15–41)
Albumin: 3.8 g/dL (ref 3.5–5.0)
Alkaline Phosphatase: 67 U/L (ref 38–126)
Anion gap: 9 (ref 5–15)
BUN: 15 mg/dL (ref 6–20)
CO2: 24 mmol/L (ref 22–32)
Calcium: 9.1 mg/dL (ref 8.9–10.3)
Chloride: 107 mmol/L (ref 98–111)
Creatinine, Ser: 0.84 mg/dL (ref 0.44–1.00)
GFR, Estimated: >60 mL/min (ref >60)
Glucose, Bld: 103 mg/dL — ABNORMAL HIGH (ref 70–99)
Potassium: 4.3 mmol/L (ref 3.5–5.1)
Sodium: 140 mmol/L (ref 135–145)
Total Bilirubin: 0.3 mg/dL (ref 0.3–1.2)
Total Protein: 6 g/dL — ABNORMAL LOW (ref 6.5–8.1)

## 2022-10-10 LAB — DIFFERENTIAL
Abs Immature Granulocytes: 0.01 10*3/uL (ref 0.00–0.07)
Basophils Absolute: 0.1 10*3/uL (ref 0.0–0.1)
Basophils Relative: 1 %
Eosinophils Absolute: 0.1 10*3/uL (ref 0.0–0.5)
Eosinophils Relative: 1 %
Immature Granulocytes: 0 %
Lymphocytes Relative: 37 %
Lymphs Abs: 2.3 10*3/uL (ref 0.7–4.0)
Monocytes Absolute: 0.4 10*3/uL (ref 0.1–1.0)
Monocytes Relative: 6 %
Neutro Abs: 3.5 10*3/uL (ref 1.7–7.7)
Neutrophils Relative %: 55 %

## 2022-10-10 LAB — PROTIME-INR
INR: 1 (ref 0.8–1.2)
Prothrombin Time: 12.7 seconds (ref 11.4–15.2)

## 2022-10-10 LAB — APTT: aPTT: 28 seconds (ref 24–36)

## 2022-10-10 NOTE — Telephone Encounter (Signed)
  Pt said, she is following up her mychart message, she still have the same symptoms and her smile is a little crooked. She would like to get an appt sooner than 05/29, she requested if one of the nurses can call her back

## 2022-10-10 NOTE — ED Notes (Signed)
Pt has a slight left side droop on the bottom jaw.

## 2022-10-10 NOTE — ED Notes (Signed)
Patient transported to MRI 

## 2022-10-10 NOTE — ED Triage Notes (Signed)
Pt BIB [facility] from home due left sided facial droop.  LKW 2100 10/09/2022.  Pt has had cosmetic surgery and lip flip 09/2022.  VSS. 18g left AC.

## 2022-10-10 NOTE — ED Provider Notes (Signed)
[place] EMERGENCY DEPARTMENT AT [facility] Provider Note   CSN: [id number] Arrival date & time: 10/10/22  1119     History {Add pertinent medical, surgical, social history, OB history to HPI:1} Chief Complaint  Patient presents with   Facial Droop    Nichole Manning is a 57 y.o. female.  HPI    Pt comes in wit  Home Medications Prior to Admission medications   Medication Sig Start Date End Date Taking? Authorizing Provider  acetaminophen (TYLENOL) 325 MG tablet Take 2 tablets (650 mg total) by mouth every 6 (six) hours as needed for mild pain, fever or headache. 09/10/20   Juliet Rude, PA-C  albuterol (VENTOLIN HFA) 108 (90 Base) MCG/ACT inhaler Inhale into the lungs every 6 (six) hours as needed for wheezing or shortness of breath.    [provider]  ALPRAZolam Prudy Feeler) 0.5 MG tablet Take 1 tablet (0.5 mg total) by mouth at bedtime as needed for anxiety. 07/02/13   Olivia Mackie, MD  BEVESPI AEROSPHERE 9-4.8 MCG/ACT AERO Inhale 2 puffs into the lungs in the morning and at bedtime. 07/20/20   [provider]  Calcium Citrate 250 MG TABS Take 1 tablet by mouth daily. 07/13/20   [provider]  Clindamycin-Benzoyl Per, Refr, gel Apply 1 application topically daily. 08/27/20   [provider]  cyanocobalamin (,VITAMIN B-12,) 1000 MCG/ML injection Inject 1,000 mcg into the muscle every 28 (twenty-eight) days. 08/08/20   [provider]  levothyroxine (SYNTHROID) 88 MCG tablet Take 88 mcg by mouth every morning. 08/11/20   [provider]  lidocaine (XYLOCAINE) 2 % solution Use as directed 15 mLs in the mouth or throat as needed for mouth pain. 04/08/22   Mickie Bail, NP  metoprolol tartrate (LOPRESSOR) 100 MG tablet Take one tablet by mouth two hours prior to CT. 07/04/22   Dunn, Tacey Ruiz, PA-C  Multiple Vitamin (MULTIVITAMIN WITH MINERALS) TABS tablet Take 1 tablet by mouth daily.    [provider]  NURTEC 75  MG TBDP Take 1 tablet by mouth every other day as needed (migraines). 07/27/20   [provider]  propranolol (INDERAL) 10 MG tablet Take 10 mg by mouth as needed (palpitations).    [provider]  spironolactone (ALDACTONE) 25 MG tablet Take 25 mg by mouth daily. 10/28/18   [provider]  traMADol (ULTRAM) 50 MG tablet Take 1 tablet (50 mg total) by mouth every 6 (six) hours as needed for moderate pain. 09/10/20   Juliet Rude, PA-C  vitamin C (ASCORBIC ACID) 500 MG tablet Take 500 mg by mouth daily.    [provider]      Allergies    Sulfa antibiotics, Adhesive [tape], and Codeine    Review of Systems   Review of Systems  Physical Exam Updated Vital Signs BP (!) 123/90 (BP Location: Left Arm)   Pulse 89   Temp 98.4 F (36.9 C) (Oral)   Resp 20   Ht 5\' 4"  (1.626 m)   Wt 56.7 kg   SpO2 100%   BMI 21.46 kg/m  Physical Exam  ED Results / Procedures / Treatments   Labs (all labs ordered are listed, but only abnormal results are displayed) Labs Reviewed  COMPREHENSIVE METABOLIC PANEL - Abnormal; Notable for the following components:      Result Value   Glucose, Bld 103 (*)    Total Protein 6.0 (*)    All other components within normal limits  PROTIME-INR  APTT  CBC  DIFFERENTIAL    EKG EKG Interpretation  Date/Time:  Friday Oct 10 2022 13:46:12 EDT Ventricular Rate:  86 PR Interval:  156 QRS Duration: 81 QT Interval:  374 QTC Calculation: 448 R Axis:   84 Text Interpretation: Sinus rhythm Ventricular trigeminy Probable left atrial enlargement RSR' in V1 or V2, right VCD or RVH trigeminy is new Confirmed by Derwood Kaplan [phone]) on 10/10/2022 1:53:20 PM  Radiology MR BRAIN WO CONTRAST  Result Date: 10/10/2022 CLINICAL DATA:  Stroke suspected EXAM: MRI HEAD WITHOUT CONTRAST TECHNIQUE: Multiplanar, multiecho pulse sequences of the brain and surrounding structures were obtained without intravenous contrast. COMPARISON:  None  Available. FINDINGS: Brain: Negative for an acute infarct. No hemorrhage. No extra-axial fluid collection. No hydrocephalus. Apparent incomplete suppression of FLAIR signal in the bilateral occipital lobes is favored to be artifactual. Mild for age chronic microvascular ischemic change. Vascular: Normal flow voids. Skull and upper cervical spine: Normal marrow signal. Sinuses/Orbits: Small right mastoid effusion. Trace mucosal thickening bilateral ethmoid sinuses. No middle ear effusion. Orbits are unremarkable. Other: None. IMPRESSION: No acute intracranial process. Electronically Signed   By: Lorenza Cambridge M.D.   On: 10/10/2022 13:45    Procedures Procedures  {Document cardiac monitor, telemetry assessment procedure when appropriate:1}  Medications Ordered in ED Medications - No data to display  ED Course/ Medical Decision Making/ A&P   {   Click here for ABCD2, HEART and other calculatorsREFRESH Note before signing :1}                          Medical Decision Making Amount and/or Complexity of Data Reviewed Labs: ordered. Radiology: ordered.   ***  {Document critical care time when appropriate:1} {Document review of labs and clinical decision tools ie heart score, Chads2Vasc2 etc:1}  {Document your independent review of radiology images, and any outside records:1} {Document your discussion with family members, caretakers, and with consultants:1} {Document social determinants of health affecting pt's care:1} {Document your decision making why or why not admission, treatments were needed:1} Final Clinical Impression(s) / ED Diagnoses Final diagnoses:  Facial droop    Rx / DC Orders ED Discharge Orders          Ordered    Ambulatory referral to Neurology       Comments: An appointment is requested in approximately: 2 weeks   10/10/22 1402

## 2022-10-10 NOTE — Discharge Instructions (Addendum)
Please follow up with your surgeon to see if facial changes are due to surgery related issues. Follow up with Neurology in 2 weeks for headaches and if needed facial droop.

## 2022-10-10 NOTE — Telephone Encounter (Addendum)
Returned call to patient,   Patient states she is having a lot of sweating on her hands and feet, her heart is racing. She did have a lip lift a few weeks ago but today notices that the left side of her mouth is dropping. She also notes headache, nausea, she notes she has been having a hard time seeing lately but she is unsure if this is related. Advised her that it is absolutely imperative that she be seen in the Emergency Department as she is having active stroke symptoms. Patient hesitant and asked if she could just have an earlier appointment. Advised patient that we cannot adjust appointments without her being seen in the ED. Advised her once she is seen in the ED we can adjust her appointment at the recommendations of her discharging doctor.   Again pressed patient to call 911. Spoke with my supervisor Jim Like about concerns that patient may not go into be seen. Barth Kirks was also going to call the patient.

## 2022-10-10 NOTE — Telephone Encounter (Signed)
Call to back to patient to ensure she was aware of the need to call 911 if her smile is crooked and she has been having headaches with nausea.  Pt thinks she feel her recent lip lift (several weeks ago) could be causing her crooked smile. Writer explained that recent lip lift likely wouldn't cause nausea and headache. Pt agreed to call 911.  Jim Like MHA RN CCM

## 2022-10-13 ENCOUNTER — Telehealth: Payer: Self-pay | Admitting: Cardiology

## 2022-10-13 NOTE — Telephone Encounter (Signed)
See 5/03 encounter--Patient is following up. She went to the ED as advised. She presented as if she was having a stroke, but MRI showed that she didn't have a stroke. She states she has been experiencing PVC's. She would like to know if her ED notes with this information can be reviewed and if she can be worked in for an appointment sooner than 5/29.

## 2022-10-16 ENCOUNTER — Encounter: Payer: Self-pay | Admitting: Neurology

## 2022-11-03 NOTE — Progress Notes (Unsigned)
Cardiology Office Note    Date:  11/05/2022   ID:  Nichole Manning, DOB 28-Dec-1965, MRN [id number]  PCP:  Franciso Bend, NP  Cardiologist:  Jodelle Red, MD  Electrophysiologist:  None   Chief Complaint: f/u PVCs  History of Present Illness:   Nichole Manning is a 56 y.o. female with history of anxiety, COPD, tobacco abuse, hypothyroidism followed by endocrinology, trivial MR, PSVT, PACs, PVCs, severe Covid infection with ?early ILD by 2021 CT, aortic atherosclerosis by CT 2021 seen for evaluation.   She was previously seen for palpitations. Monitor 01/2020 55-144bpm, avg 79bpm, predominantly NSR, one 5 beat SVT, rare <1% PACs, PVCs, 2 triggered events with sinus with PVC. Lipids are managed by primary care. She was seen in clinic in 06/2022 for pre-op evaluation for elective blepharoplasty and was found to have abnormal EKG. The patient requested follow-up here for evaluation. She also noted occasional exertional dyspnea. The EKG obtained from the PCP visit showed NSR 83bpm frequent PVCs, nonspecific STTW changes otherwise in precordial leads though possibly due to lead placement. Her EKG here showed NSR with no significant STTW changes anteriorly and no further ectopy. 3 day Zio 07/2022 showed 2.2% PVCs, <1% PACs, 3 short runs SVT (longest 13 beats). Echo 07/2022 showed EF 60-65%, trivial MR. Coronary CTA was unable to be performed due to her PVCs, so ETT was performed instead which was read as negative, achieved 9.2 METS. She did not wish to add scheduled BB at that time. She is on spironolactone for acne and minoxidil for hair growth. She requested sleep study due to fatigue which was negative. She went on to have her cosmetic procedure. She was seen in the ED 10/10/22 with odd sensation over her left cheek and possible subtle facial droop. Brain MRI was normal and she was advised to f/u with her plastic surgeon. She states she did so and was told she may have Bell's palsy. Since April  then she's had some issues with her feet and palms sweating. She states she's seen PCP and endocrinology for this issue and told it was possibly stress. She was prescribed of trial of hydroxyzine. She does report stress at home with her teenage son. She also reports periodically feeling her PVCs described as a pounding sensation. She walks her dog a mile multiple times a week. Sometimes she is able to do so without any problem at all, other times she has noticed some SOB with activity described similarly in the past.   Labwork independently reviewed: 10/2022 K 4.3, Cr 0.84, AST/ALT OK, CBC OK, PT INR OK, TSH suppressed 0.034 but free T4 wnl, Total T3 wnl 06/2022 Mg 2.2 11/2020 LFTS ok, LDL 83, trig 147 (PCP), TSH 0.880 with elevated FT4   Past History   Past Medical History:  Diagnosis Date   Anxiety    COPD (chronic obstructive pulmonary disease) (HCC)    Fibroids    Hypothyroidism    Palpitations    SVD (spontaneous vaginal delivery)    x 3    Past Surgical History:  Procedure Laterality Date   APPENDECTOMY     AUGMENTATION MAMMAPLASTY     BILATERAL SALPINGECTOMY Bilateral 07/01/2013   Procedure: BILATERAL SALPINGECTOMY;  Surgeon: Lenoard Aden, MD;  Location: [facility];  Service: Gynecology;  Laterality: Bilateral;   BREAST SURGERY     breast reduction x 2   LAPAROSCOPIC APPENDECTOMY N/A 09/09/2020   Procedure: APPENDECTOMY LAPAROSCOPIC;  Surgeon: Kinsinger, De Blanch, MD;  [facility]: [facility]  OR;  Service: General;  Laterality: N/A;   TUBAL LIGATION     WISDOM TOOTH EXTRACTION      Current Medications: Current Meds  Medication Sig   acetaminophen (TYLENOL) 325 MG tablet Take 2 tablets (650 mg total) by mouth every 6 (six) hours as needed for mild pain, fever or headache.   albuterol (VENTOLIN HFA) 108 (90 Base) MCG/ACT inhaler Inhale into the lungs every 6 (six) hours as needed for wheezing or shortness of breath.   ALTRENO 0.05 % LOTN Apply 0.05 % topically daily.   BEVESPI  AEROSPHERE 9-4.8 MCG/ACT AERO Inhale 2 puffs into the lungs in the morning and at bedtime.   Calcium Citrate 250 MG TABS Take 1 tablet by mouth daily.   Clindamycin-Benzoyl Per, Refr, gel Apply 1 application topically daily.   cyanocobalamin (,VITAMIN B-12,) 1000 MCG/ML injection Inject 1,000 mcg into the muscle every 28 (twenty-eight) days.   estradiol (ESTRACE) 0.5 MG tablet Take 0.5 mg by mouth daily.   hydrOXYzine (VISTARIL) 25 MG capsule Take 25-50 mg by mouth at bedtime as needed.   levothyroxine (SYNTHROID) 88 MCG tablet Take 88 mcg by mouth every morning.   lidocaine (XYLOCAINE) 2 % solution Use as directed 15 mLs in the mouth or throat as needed for mouth pain.   linaclotide (LINZESS) 145 MCG CAPS capsule Take 145 mg by mouth daily.   minoxidil (LONITEN) 2.5 MG tablet Take 2.5 mg by mouth daily.   Multiple Vitamin (MULTIVITAMIN WITH MINERALS) TABS tablet Take 1 tablet by mouth daily.   progesterone (PROMETRIUM) 200 MG capsule Take 200 mg by mouth at bedtime.   propranolol ER (INDERAL LA) 60 MG 24 hr capsule Take 1 capsule (60 mg total) by mouth daily.   spironolactone (ALDACTONE) 25 MG tablet Take 25 mg by mouth daily.   SYMBICORT 160-4.5 MCG/ACT inhaler Inhale 2 puffs into the lungs as needed.   vitamin C (ASCORBIC ACID) 500 MG tablet Take 500 mg by mouth daily.   [DISCONTINUED] propranolol (INDERAL) 10 MG tablet Take 10 mg by mouth as needed (palpitations).      Allergies:   Sulfa antibiotics, Adhesive [tape], and Codeine   Social History   Socioeconomic History   Marital status: Married    Spouse name: Not on file   Number of children: Not on file   Years of education: Not on file   Highest education level: Not on file  Occupational History   Not on file  Tobacco Use   Smoking status: Former    Packs/day: 1.50    Years: 30.00    Additional pack years: 0.00    Total pack years: 45.00    Types: Cigarettes   Smokeless tobacco: Never   Tobacco comments:    20  cigarettes per day 11/16/19 [facility]   Vaping Use   Vaping Use: Some days  Substance and Sexual Activity   Alcohol use: Yes    Comment: social   Drug use: No   Sexual activity: Yes    Birth control/protection: Surgical  Other Topics Concern   Not on file  Social History Narrative   Leave with son   2-story home   Left handed   2-cup cofee   Social Determinants of Health   Financial Resource Strain: Not on file  Food Insecurity: Not on file  Transportation Needs: Not on file  Physical Activity: Not on file  Stress: Not on file  Social Connections: Not on file     Family History:  The  patient's family history includes Breast cancer (age of onset: 32) in her mother; Cancer in her father.  ROS:   Please see the history of present illness.  All other systems are reviewed and otherwise negative.    EKG(s)/Additional Testing   EKG:  EKG is not ordered today but reviewed from recent ED visit 10/10/22 NSR 86bpm, ventricular trigeminy, RSR V1, no acute STTW changes from prior.  CV Studies: Cardiac studies reviewed are outlined and summarized above. Otherwise please see EMR for full report.  Recent Labs: 07/04/2022: Magnesium 2.2 10/10/2022: ALT 19; BUN 15; Creatinine, Ser 0.84; Hemoglobin 13.5; Platelets 230; Potassium 4.3; Sodium 140  Recent Lipid Panel No results found for: "CHOL", "TRIG", "HDL", "CHOLHDL", "VLDL", "LDLCALC", "LDLDIRECT"  PHYSICAL EXAM:    VS:  BP 114/82   Pulse 62   Ht 5\' 5"  (1.651 m)   Wt 127 lb 12.8 oz (58 kg)   SpO2 98%   BMI 21.27 kg/m   BMI: Body mass index is 21.27 kg/m.  GEN: Well nourished, well developed female in no acute distress HEENT: normocephalic, atraumatic Neck: no JVD, carotid bruits, or masses Cardiac: RRR; no murmurs, rubs, or gallops, no edema  Respiratory:  clear to auscultation bilaterally, normal work of breathing GI: soft, nontender, nondistended, + BS MS: no deformity or atrophy Skin: warm and dry, no rash Neuro:  Alert and  Oriented x 3, Strength and sensation are intact, follows commands. Minimal residual L facial droop. Psych: euthymic mood, full affect  Wt Readings from Last 3 Encounters:  11/05/22 127 lb 12.8 oz (58 kg)  10/10/22 125 lb (56.7 kg)  07/31/22 128 lb 6.4 oz (58.2 kg)     ASSESSMENT & PLAN:   1. PVCs - she is having some palpitations and intermittent SOB. She's had occasional PVCs and trigeminy picked up on EKGs though recent Zio showed fairly low burden at 2.2%. Echo and ETT were reassuring. Per shared decision making we will stop the short acting PRN propranolol (only taking sporadically) and trial once daily propranolol LA 60mg  daily. She also reports stress and this may have some modest anti-anxiety effect. She will give update of how she is doing in 2 weeks. Not convinced of a relationship between recent issues with palm/feet sweating and any specific cardiac condition therefore recommended ongoing discussions with endocrine and PCP.  2. PSVT, rare PACs - follow clinical response to BB above.  3. SOB - episodic, good days and bad days. 2D echo, ETT reassuring. No evidence of volume overload on exam. CT 2021 demonstrated ? early ILD in setting of Covid with recommendation to repeat 1 year. She was following with pulmonology for a while then did not subsequently follow-up. Also previously told she may have COPD, tobacco cessation previously advised. We will repeat the high res CT to trend.  4. Hypothyroidism - recent TSH was suppressed but free hormones were OK. Defer further management to endocrinology who obtained these labs. Patient states she did make them aware of concerns for feet/palm sweating.  5. Aortic atherosclerosis - noted on high res CT 2021. Last LDL 83. Defer lipid management to primary care. Can consider revisiting in follow-up if not reviewed in primary care follow-up.    Disposition: F/u with me or Tessa in 3-4 months to f/u response to BB.   Medication Adjustments/Labs and  Tests Ordered: Current medicines are reviewed at length with the patient today.  Concerns regarding medicines are outlined above. Medication changes, Labs and Tests ordered today are summarized above  and listed in the Patient Instructions accessible in Encounters.   Signed, Laurann Montana, PA-C  11/05/2022 12:31 PM    [facility] Phone: [phone]; Fax: [phone]

## 2022-11-05 ENCOUNTER — Encounter: Payer: Self-pay | Admitting: Physician Assistant

## 2022-11-05 ENCOUNTER — Ambulatory Visit: Payer: Medicaid Other | Attending: Physician Assistant | Admitting: Physician Assistant

## 2022-11-05 VITALS — BP 114/82 | HR 62 | Ht 65.0 in | Wt 127.8 lb

## 2022-11-05 DIAGNOSIS — R0602 Shortness of breath: Secondary | ICD-10-CM | POA: Diagnosis not present

## 2022-11-05 DIAGNOSIS — I493 Ventricular premature depolarization: Secondary | ICD-10-CM

## 2022-11-05 DIAGNOSIS — I700 Atherosclerosis of aorta: Secondary | ICD-10-CM

## 2022-11-05 DIAGNOSIS — I471 Supraventricular tachycardia, unspecified: Secondary | ICD-10-CM

## 2022-11-05 DIAGNOSIS — I491 Atrial premature depolarization: Secondary | ICD-10-CM | POA: Diagnosis not present

## 2022-11-05 DIAGNOSIS — E039 Hypothyroidism, unspecified: Secondary | ICD-10-CM

## 2022-11-05 MED ORDER — PROPRANOLOL HCL ER 60 MG PO CP24: 60.0000 mg | ORAL_CAPSULE | Freq: Every day | ORAL | 6 refills | Status: DC

## 2022-11-05 NOTE — Patient Instructions (Addendum)
Medication Instructions:  Your physician has recommended you make the following change in your medication:   1) STOP propranolol 10mg  as needed 2) START propranolol LA 60mg  daily  *If you need a refill on your cardiac medications before your next appointment, please call your pharmacy*  Lab Work: None ordered today.  Testing/Procedures: Your provider has requested that you have a high resolution chest CT scan performed. Non-Cardiac CT scanning, (CAT scanning), is a noninvasive, special x-ray that produces cross-sectional images of the body using x-rays and a computer. CT scans help physicians diagnose and treat medical conditions. For some CT exams, a contrast material is used to enhance visibility in the area of the body being studied. CT scans provide greater clarity and reveal more details than regular x-ray exams.   Follow-Up: At [facility], you and your health needs are our priority.  As part of our continuing mission to provide you with exceptional heart care, we have created designated Provider Care Teams.  These Care Teams include your primary Cardiologist (physician) and Advanced Practice Providers (APPs -  Physician Assistants and Nurse Practitioners) who all work together to provide you with the care you need, when you need it.  Your next appointment:   4 month(s)  The format for your next appointment:   In Person  Provider:   Jari Favre, PA-C or [name], PA-C

## 2022-11-18 NOTE — Progress Notes (Unsigned)
NEUROLOGY CONSULTATION NOTE  Nichole Manning MRN: [id number] DOB: 29-Apr-1966  Referring provider: Rexene Agent, NP Primary care provider: Rexene Agent, NP  Reason for consult:  facial droop  Assessment/Plan:   Facial asymmetry - very subtle.  Unclear etiology.  Stroke ruled out.  Cannot say this was Bell's palsy as it spared the forehead.   Chronic migraine without aura, without status migrainosus, not intractable Medication-overuse headache Ocular migraine   Migraine prevention:  Just started on propranolol for palpitations.  Will see how it affects her migraines.  Blood pressure running a little low.  Asymptomatic.  If it becomes a problem where propranolol needs to be discontinued, would start a CGRP inhibitor. Migraine rescue:  She will try samples of Ubrelvy. If ineffective, will resubmit prescription for Nurtec, as it was effective.  Triptans are contraindicated due to her cardiac history. Limit use of pain relievers to no more than 2 days out of week to prevent risk of rebound or medication-overuse headache. Keep headache diary Lifestyle modification:  discussed caffeine cessation, improved sleep hygiene Follow up 6 months.    Subjective:  Nichole Manning is a 57 year old female with COPD, hypothyroidism, anxiety and palpitations (PSVT, PVCs, PACs) who presents for facial droop.  History supplemented by ED and referring provider's note.  She had cosmetic workup on her face in late April.  On 5/2, she noted an odd sensation over the left lower cheek and jaw region.  The next morning, she noted a mild left sided facial droop.  Denies difficulty closing the left eye, moving her eyebrow, trouble swallowing, double vision, altered taste and hearing or ear/facial/neck pain.  No involvement of her extremities.  She contacted her cardiologist who advised her to go to the ED.  In the ED, the provider noted slight facial asymmetry but uncertain if it was due to edema on the RIGHT  side because of the surgery.  MRI of brain personally reviewed showed mild chronic small vessel ischemic changes within normal limits for age.  She had a lip lift on 4/9.  She followed up with her plastic surgeon who told her that it is unlikely related to the procedure and that she may have had a Bell's palsy.  No preceding illness.  She thinks the asymmetry is slightly improved.  She also has longstanding history of migraines since young adulthood.  They have been daily for the past couple of years.  She describes 8/10 diffuse (but primarily bifrontal) pressure/throbbing headache with nausea.  Wakes up every morning with them, takes a Goody powder and resolves within 1 to 2 hours.  However, will return later in the afternoon or evening.  In addition, she has episodes of visual disturbance, described as white lightening bolts that impede her vision in both eyes that slowly move to the periphery before disappearing, lasting up to an hour.  Occurs once a month.    She is followed by cardiology for PVCs, PACs and PSVTs.  Started on propranolol ER 60mg  daily 2 weeks ago.  Notices that she hasn't been waking up with the headaches but will develop them later in the morning.  Prior medications include topiramate and venlafaxine.    She has 1 cup of coffee daily.  She drinks a lot of water.  She doesn't skip meals.  She exercises.  Sleep is poor.  She has trouble falling asleep and staying asleep.  Will often watch TV or her phone.      PAST MEDICAL HISTORY: Past  Medical History:  Diagnosis Date   Anxiety    COPD (chronic obstructive pulmonary disease) (HCC)    Fibroids    Hypothyroidism    Palpitations    SVD (spontaneous vaginal delivery)    x 3    PAST SURGICAL HISTORY: Past Surgical History:  Procedure Laterality Date   APPENDECTOMY     AUGMENTATION MAMMAPLASTY     BILATERAL SALPINGECTOMY Bilateral 07/01/2013   Procedure: BILATERAL SALPINGECTOMY;  Surgeon: Lenoard Aden, MD;  Location: [facility];  Service: Gynecology;  Laterality: Bilateral;   BREAST SURGERY     breast reduction x 2   LAPAROSCOPIC APPENDECTOMY N/A 09/09/2020   Procedure: APPENDECTOMY LAPAROSCOPIC;  Surgeon: Kinsinger, De Blanch, MD;  Location: [facility] OR;  Service: General;  Laterality: N/A;   TUBAL LIGATION     WISDOM TOOTH EXTRACTION      MEDICATIONS: Current Outpatient Medications on File Prior to Visit  Medication Sig Dispense Refill   acetaminophen (TYLENOL) 325 MG tablet Take 2 tablets (650 mg total) by mouth every 6 (six) hours as needed for mild pain, fever or headache.     albuterol (VENTOLIN HFA) 108 (90 Base) MCG/ACT inhaler Inhale into the lungs every 6 (six) hours as needed for wheezing or shortness of breath.     ALPRAZolam (XANAX) 0.5 MG tablet Take 1 tablet (0.5 mg total) by mouth at bedtime as needed for anxiety. (Patient not taking: Reported on 11/05/2022) 20 tablet 0   ALTRENO 0.05 % LOTN Apply 0.05 % topically daily.     BEVESPI AEROSPHERE 9-4.8 MCG/ACT AERO Inhale 2 puffs into the lungs in the morning and at bedtime.     Calcium Citrate 250 MG TABS Take 1 tablet by mouth daily.     Clindamycin-Benzoyl Per, Refr, gel Apply 1 application topically daily.     cyanocobalamin (,VITAMIN B-12,) 1000 MCG/ML injection Inject 1,000 mcg into the muscle every 28 (twenty-eight) days.     estradiol (ESTRACE) 0.5 MG tablet Take 0.5 mg by mouth daily.     hydrOXYzine (VISTARIL) 25 MG capsule Take 25-50 mg by mouth at bedtime as needed.     levothyroxine (SYNTHROID) 88 MCG tablet Take 88 mcg by mouth every morning.     lidocaine (XYLOCAINE) 2 % solution Use as directed 15 mLs in the mouth or throat as needed for mouth pain. 100 mL 0   linaclotide (LINZESS) 145 MCG CAPS capsule Take 145 mg by mouth daily.     minoxidil (LONITEN) 2.5 MG tablet Take 2.5 mg by mouth daily.     Multiple Vitamin (MULTIVITAMIN WITH MINERALS) TABS tablet Take 1 tablet by mouth daily.     NURTEC 75 MG TBDP Take 1 tablet by mouth every  other day as needed (migraines). (Patient not taking: Reported on 11/05/2022)     progesterone (PROMETRIUM) 200 MG capsule Take 200 mg by mouth at bedtime.     propranolol ER (INDERAL LA) 60 MG 24 hr capsule Take 1 capsule (60 mg total) by mouth daily. 30 capsule 6   spironolactone (ALDACTONE) 25 MG tablet Take 25 mg by mouth daily.     SYMBICORT 160-4.5 MCG/ACT inhaler Inhale 2 puffs into the lungs as needed.     traMADol (ULTRAM) 50 MG tablet Take 1 tablet (50 mg total) by mouth every 6 (six) hours as needed for moderate pain. (Patient not taking: Reported on 11/05/2022) 15 tablet 0   vitamin C (ASCORBIC ACID) 500 MG tablet Take 500 mg by mouth daily.  No current facility-administered medications on file prior to visit.    ALLERGIES: Allergies  Allergen Reactions   Sulfa Antibiotics Hives   Adhesive [Tape] Itching    Rxn was from [employer].   Codeine Nausea And Vomiting    FAMILY HISTORY: Family History  Problem Relation Age of Onset   Breast cancer Mother 3   Cancer Father     Objective:  Blood pressure 110/88, pulse 81, height 5\' 5"  (1.651 m), weight 128 lb 3.2 oz (58.2 kg), SpO2 97 %. General: No acute distress.  Patient appears well-groomed.   Head:  Normocephalic/atraumatic Eyes:  fundi examined but not visualized Neck: supple, no paraspinal tenderness, full range of motion Back: No paraspinal tenderness Heart: regular rate and rhythm Neurological Exam: Mental status: alert and oriented to person, place, and time, speech fluent and not dysarthric, language intact. Cranial nerves: CN I: not tested CN II: pupils equal, round and reactive to light, visual fields intact CN III, IV, VI:  full range of motion, no nystagmus, no ptosis CN V: facial sensation intact. CN VII: flattening of right nasolabial fold.   CN VIII: hearing intact CN IX, X: gag intact, uvula midline CN XI: sternocleidomastoid and trapezius muscles intact CN XII: tongue midline Bulk & Tone: normal,  no fasciculations. Motor:  muscle strength 5/5 throughout Sensation:  Temperature and vibratory sensation intact. Deep Tendon Reflexes:  2+ throughout,  toes downgoing.   Finger to nose testing:  Without dysmetria.   Heel to shin:  Without dysmetria.   Gait:  Normal station and stride.  Romberg negative.    Thank you for allowing me to take part in the care of this patient.  Shon Millet, DO  CC:  Armando Gang, FNP

## 2022-11-19 ENCOUNTER — Encounter: Payer: Self-pay | Admitting: Neurology

## 2022-11-19 ENCOUNTER — Ambulatory Visit: Payer: Medicaid Other | Admitting: Neurology

## 2022-11-19 VITALS — BP 110/88 | HR 81 | Ht 65.0 in | Wt 128.2 lb

## 2022-11-19 DIAGNOSIS — G43709 Chronic migraine without aura, not intractable, without status migrainosus: Secondary | ICD-10-CM | POA: Diagnosis not present

## 2022-11-19 DIAGNOSIS — Q670 Congenital facial asymmetry: Secondary | ICD-10-CM

## 2022-11-19 DIAGNOSIS — G43109 Migraine with aura, not intractable, without status migrainosus: Secondary | ICD-10-CM | POA: Diagnosis not present

## 2022-11-19 DIAGNOSIS — G4440 Drug-induced headache, not elsewhere classified, not intractable: Secondary | ICD-10-CM

## 2022-11-19 NOTE — Patient Instructions (Signed)
Contact your cardiologist about the low blood pressure.  If he thinks you should stop propranolol, let me know and we will start a monthly injection for migraine prevention Stop the powder and all over the counter pain relievers.   At earliest onset of headache, take Ubrelvy.  May repeat after 2 hours. Maximum 2 tablets in 24 hours.  Let me know if effective and I will send prescription. Otherwise, will restart Nurtec Keep headache diary Caffeine cessation Follow up 6 months.

## 2022-12-02 ENCOUNTER — Ambulatory Visit (HOSPITAL_COMMUNITY)
Admission: RE | Admit: 2022-12-02 | Discharge: 2022-12-02 | Disposition: A | Discharge status: Home / Self Care | Payer: Medicaid Other | Source: Ambulatory Visit | Attending: Physician Assistant | Admitting: Physician Assistant

## 2022-12-02 DIAGNOSIS — R0602 Shortness of breath: Secondary | ICD-10-CM | POA: Diagnosis present

## 2022-12-08 ENCOUNTER — Telehealth: Payer: Self-pay | Admitting: Physician Assistant

## 2022-12-08 NOTE — Telephone Encounter (Signed)
Pt was returning nurse call regarding her results and is requesting a callback. Please advise

## 2022-12-08 NOTE — Telephone Encounter (Signed)
-----   Message from Laurann Montana, [place] sent at 12/06/2022  1:40 PM EDT ----- Please let pt know CT scan continues to show findings possibly consistent with early or mild interstitial lung disease as was the concern back in 2021 post Covid. I would recommend she get back in to see pulmonology. Saw Dr. Delton Coombes in 2021 but >3 years ago so may be considered new patient; not sure of their policy. She can call their office to schedule appt but if they say she needs a referral, I am happy to do this. There is also some mild aortic atherosclerosis/plaque as seen in 2021, no significant coronary calcification noted. PCP managing lipids so would continue ongoing f/u with PCP with a goal LDL of less than 70.

## 2022-12-08 NOTE — Telephone Encounter (Signed)
I called and spoke w the patient.  Reviewed CT results w her..  Will forward to PCP.   Going there next week and will plan for checking lipids.

## 2023-01-07 ENCOUNTER — Other Ambulatory Visit: Payer: Self-pay | Admitting: Family Medicine

## 2023-01-07 DIAGNOSIS — Z1231 Encounter for screening mammogram for malignant neoplasm of breast: Secondary | ICD-10-CM

## 2023-01-28 ENCOUNTER — Encounter: Payer: Self-pay | Admitting: Pulmonary Disease

## 2023-01-28 ENCOUNTER — Ambulatory Visit: Payer: Medicaid Other | Admitting: Pulmonary Disease

## 2023-01-28 VITALS — BP 120/60 | HR 64 | Temp 97.8°F | Ht 65.0 in | Wt 126.4 lb

## 2023-01-28 DIAGNOSIS — R0602 Shortness of breath: Secondary | ICD-10-CM | POA: Diagnosis not present

## 2023-01-28 DIAGNOSIS — R5382 Chronic fatigue, unspecified: Secondary | ICD-10-CM | POA: Diagnosis not present

## 2023-01-28 LAB — CK: Total CK: 34 U/L (ref 7–177)

## 2023-01-28 NOTE — Patient Instructions (Addendum)
Will get some lab tests for further evaluation of your lungs Please avoid vaping and exposure to down pillows Will order PFTs in 3 to 4 months and return to clinic after these tests Return to clinic in 4 months after PFTs for review

## 2023-01-28 NOTE — Progress Notes (Signed)
Nichole Manning    [id number]    [birth year]  Primary Care Physician:Lindley, Fernand Parkins, FNP  Referring Physician: Armando Gang, FNP [address] [place],  [place] [address]  Chief complaint: Consult for abnormal CT, evaluation for COPD  HPI: 57 y.o. who  has a past medical history of Anxiety, COPD (chronic obstructive pulmonary disease) (HCC), Fibroids, Hypothyroidism, Palpitations, and SVD (spontaneous vaginal delivery).   Referred for evaluation of COPD and dyspnea with abnormal CT scan. She had history of COVID infection in June 2020 and was evaluated in the pulmonary office with cough, shortness of breath, brain fog and memory loss.  Had a CT at that time showed subtle basal changes.  PFTs were ordered as follow-up but she did not go through with the test  Now referred back with increasing dyspnea on exertion.  She has occasional cough.  No mucus production or wheezing.  She had a follow-up high-res CT recently which showed increased groundglass opacities at the base.  Pets: Dog Occupation: Retired.  Used to work in legal Exposures: She has a feather pillow on her living room so far.  No mold, hot tub, Jacuzzi. Smoking history: 45-pack-year smoker.  Quit in 2021 and started vaping Travel history: No significant travel history Relevant family history: No family history of lung disease  Outpatient Encounter Medications as of 01/28/2023  Medication Sig   acetaminophen (TYLENOL) 325 MG tablet Take 2 tablets (650 mg total) by mouth every 6 (six) hours as needed for mild pain, fever or headache.   albuterol (VENTOLIN HFA) 108 (90 Base) MCG/ACT inhaler Inhale into the lungs every 6 (six) hours as needed for wheezing or shortness of breath.   ALPRAZolam (XANAX) 0.5 MG tablet Take 1 tablet (0.5 mg total) by mouth at bedtime as needed for anxiety.   ALTRENO 0.05 % LOTN Apply 0.05 % topically daily.   BEVESPI AEROSPHERE 9-4.8 MCG/ACT AERO Inhale 2 puffs into the lungs in  the morning and at bedtime.   Calcium Citrate 250 MG TABS Take 1 tablet by mouth daily.   Clindamycin-Benzoyl Per, Refr, gel Apply 1 application topically daily.   cyanocobalamin (,VITAMIN B-12,) 1000 MCG/ML injection Inject 1,000 mcg into the muscle every 28 (twenty-eight) days.   estradiol (ESTRACE) 0.5 MG tablet Take 0.5 mg by mouth daily.   hydrOXYzine (VISTARIL) 25 MG capsule Take 25-50 mg by mouth at bedtime as needed.   levothyroxine (SYNTHROID) 88 MCG tablet Take 88 mcg by mouth every morning.   lidocaine (XYLOCAINE) 2 % solution Use as directed 15 mLs in the mouth or throat as needed for mouth pain.   linaclotide (LINZESS) 145 MCG CAPS capsule Take 145 mg by mouth daily.   minoxidil (LONITEN) 2.5 MG tablet Take 2.5 mg by mouth daily.   Multiple Vitamin (MULTIVITAMIN WITH MINERALS) TABS tablet Take 1 tablet by mouth daily.   NURTEC 75 MG TBDP Take 1 tablet by mouth every other day as needed (migraines).   progesterone (PROMETRIUM) 200 MG capsule Take 200 mg by mouth at bedtime.   propranolol ER (INDERAL LA) 60 MG 24 hr capsule Take 1 capsule (60 mg total) by mouth daily.   spironolactone (ALDACTONE) 25 MG tablet Take 25 mg by mouth daily.   SYMBICORT 160-4.5 MCG/ACT inhaler Inhale 2 puffs into the lungs as needed.   traMADol (ULTRAM) 50 MG tablet Take 1 tablet (50 mg total) by mouth every 6 (six) hours as needed for moderate pain.  vitamin C (ASCORBIC ACID) 500 MG tablet Take 500 mg by mouth daily.   No facility-administered encounter medications on file as of 01/28/2023.    Allergies as of 01/28/2023 - Review Complete 01/28/2023  Allergen Reaction Noted   Sulfa antibiotics Hives 06/20/2013   Adhesive [tape] Itching 06/20/2013   Codeine Nausea And Vomiting 06/20/2013    Past Medical History:  Diagnosis Date   Anxiety    COPD (chronic obstructive pulmonary disease) (HCC)    Fibroids    Hypothyroidism    Palpitations    SVD (spontaneous vaginal delivery)    x 3    Past  Surgical History:  Procedure Laterality Date   APPENDECTOMY     AUGMENTATION MAMMAPLASTY     BILATERAL SALPINGECTOMY Bilateral 07/01/2013   Procedure: BILATERAL SALPINGECTOMY;  Surgeon: Lenoard Aden, MD;  Location: [facility];  Service: Gynecology;  Laterality: Bilateral;   BREAST SURGERY     breast reduction x 2   LAPAROSCOPIC APPENDECTOMY N/A 09/09/2020   Procedure: APPENDECTOMY LAPAROSCOPIC;  Surgeon: Kinsinger, De Blanch, MD;  Location: [facility];  Service: General;  Laterality: N/A;   TUBAL LIGATION     WISDOM TOOTH EXTRACTION      Family History  Problem Relation Age of Onset   Breast cancer Mother 28   Cancer Father     Social History   Socioeconomic History   Marital status: Married    Spouse name: Not on file   Number of children: Not on file   Years of education: Not on file   Highest education level: Not on file  Occupational History   Not on file  Tobacco Use   Smoking status: Former    Current packs/day: 1.50    Average packs/day: 1.5 packs/day for 30.0 years (45.0 ttl pk-yrs)    Types: Cigarettes   Smokeless tobacco: Never   Tobacco comments:    20 cigarettes per day 11/16/19 [facility]   Vaping Use   Vaping status: Some Days  Substance and Sexual Activity   Alcohol use: Yes    Comment: social   Drug use: No   Sexual activity: Yes    Birth control/protection: Surgical  Other Topics Concern   Not on file  Social History Narrative   Leave with son   2-story home   Left handed   2-cup cofee   Social Determinants of Health   Financial Resource Strain: Not on file  Food Insecurity: Not on file  Transportation Needs: Not on file  Physical Activity: Not on file  Stress: Not on file  Social Connections: Not on file  Intimate Partner Violence: Not on file    Review of systems: Review of Systems  Constitutional: Negative for fever and chills.  HENT: Negative.   Eyes: Negative for blurred vision.  Respiratory: as per HPI  Cardiovascular: Negative for  chest pain and palpitations.  Gastrointestinal: Negative for vomiting, diarrhea, blood per rectum. Genitourinary: Negative for dysuria, urgency, frequency and hematuria.  Musculoskeletal: Negative for myalgias, back pain and joint pain.  Skin: Negative for itching and rash.  Neurological: Negative for dizziness, tremors, focal weakness, seizures and loss of consciousness.  Endo/Heme/Allergies: Negative for environmental allergies.  Psychiatric/Behavioral: Negative for depression, suicidal ideas and hallucinations.  All other systems reviewed and are negative.  Physical Exam: Blood pressure 120/60, pulse 64, temperature 97.8 F (36.6 C), temperature source Oral, height 5\' 5"  (1.651 m), weight 126 lb 6.4 oz (57.3 kg), SpO2 100%. Gen:      No acute distress  HEENT:  EOMI, sclera anicteric Neck:     No masses; no thyromegaly Lungs:    Clear to auscultation bilaterally; normal respiratory effort CV:         Regular rate and rhythm; no murmurs Abd:      + bowel sounds; soft, non-tender; no palpable masses, no distension Ext:    No edema; adequate peripheral perfusion Skin:      Warm and dry; no rash Neuro: alert and oriented x 3 Psych: normal mood and affect  Data Reviewed: Imaging: High-res CT 12/09/2019-subtle changes in the lung base bilaterally, mild diffuse bronchial wall thickening with mild emphysema High-res CT 12/02/2022-peripheral predominant groundglass attenuation and mild septal thickening.  Indeterminate pattern.  I have reviewed the images personally.  PFTs:  Labs: CBC 10/10/2022-WBC 6.3, eos 1%, absolute eosinophil count 63  Assessment:  Assessment for COPD, interstitial lung disease CT scan shows increase in groundglass opacities at the base and indeterminate pattern.  Etiology may be ongoing vaping, exposure to down pillows although the down exposure is very minimal as the pillows are in her living room couch which she rarely uses.  Will get labs for CTD serologies.  I  have advised her to avoid exposure to down pillows and quit vaping Will get PFTs and follow-up in 3 to 4 months.  Plan/Recommendations: PFTs, CTD serologies, hypersensitivity panel  Chilton Greathouse MD [facility] 01/28/2023, 1:42 PM  CC: Armando Gang, FNP

## 2023-01-30 LAB — SJOGRENS SYNDROME-B EXTRACTABLE NUCLEAR ANTIBODY: SSB (La) (ENA) Antibody, IgG: <1 AI

## 2023-01-30 LAB — SJOGRENS SYNDROME-A EXTRACTABLE NUCLEAR ANTIBODY: SSA (Ro) (ENA) Antibody, IgG: <1 AI

## 2023-01-30 LAB — ALDOLASE: Aldolase: 2.3 U/L (ref <=8.1)

## 2023-02-02 LAB — RNP ANTIBODIES: ENA RNP Ab: <0.2 AI (ref 0.0–0.9)

## 2023-02-02 LAB — HYPERSENSITIVITY PNEUMONITIS
A. Pullulans Abs: NEGATIVE
A.Fumigatus #1 Abs: NEGATIVE
Micropolyspora faeni, IgG: NEGATIVE
Pigeon Serum Abs: NEGATIVE
Thermoact. Saccharii: NEGATIVE
Thermoactinomyces vulgaris, IgG: NEGATIVE

## 2023-02-02 LAB — ANTI-JO 1 ANTIBODY, IGG: Anti JO-1: <0.2 AI (ref 0.0–0.9)

## 2023-02-26 ENCOUNTER — Ambulatory Visit: Payer: Medicaid Other | Admitting: Neurology

## 2023-03-01 NOTE — Progress Notes (Unsigned)
Office Visit    Patient Name: Nichole Manning Date of Encounter: 03/02/2023  PCP:  Armando Gang, FNP   [facility]  Cardiologist:  Jodelle Red, MD  Advanced Practice Provider:  No care team member to display Electrophysiologist:  None }  HPI    Nichole Manning is a 57 y.o. female with a past medical history of anxiety, COPD, hypothyroidism, palpitations, PSVT, PAC, PVC, mild MR presents today for follow-up appointment.  She was last seen in the clinic by [name], PA-C 07/04/2022.  History of mild LVH and mild MR on echocardiogram.  Seen for palpitations as well.  Monitor 8/21 with heart rate 55 to 144 bpm, average 79 bpm, predominantly NSR, 1 5 beat SVT, rare less than 1% PACs, PVCs, 2 triggered events with sinus with PVCs.  Lipids managed by primary care.  When she was last seen it was for a preop evaluation for elective blepharoplasty.  She was regularly seen by PCP and found to have an abnormal EKG.  EKG from the PCP visit showed normal sinus rhythm 83 bpm with frequent PVCs, nonspecific ST TW changes otherwise in precordial leads.  EKG at our appointment showed normal sinus rhythm with resolution of ST TW changes anteriorly and no further activity.  She reports occasional palpitations about 1 time per week but not particular bothersome.  Propranolol helps but she does not take this often.  Noticed occasional dyspnea with exertion or showering.  No chest pain or syncope.  Thyroid function is followed by endocrinology with lab work just 2 weeks prior therefore they were not repeated.  She was seen by me February 2024, she states she mailed her zio in a few weeks ago and has not heard anything. We reviewed her monitor with her. She has been on spirolactone for years for acne. She smoked for 40 years and quit in 2020. She has a 73 year old child and is worried about heart disease. She does not know her family history since she is adopted. We discussed  the need for an ischemic workup. She was to have a coronary CTA but she had too much ectopy to complete the test.   Ultimately, ETT was performed and she was able to achieve 9.2 METS and was negative.  Did not wish to add a beta-blocker.  On spironolactone for acne and minoxidil for hair growth.  Requested sleep study due to fatigue which was negative.  She was seen in the ED 10/10/2022 with odd sensation over her left cheek and possible subtle facial droop.  Brain MRI was normal and she was advised to follow-up with her plastic surgeon.  She states she did so and she was told she may have Bell's palsy.  Since April she has been having some issues with her feet and palms sweating.  States she seen her PCP and endocrinologist and told it was possibly stress.  Prescribed a trial of hydroxyzine.  She does have stress at home with a teenage son.  Reports periodically feeling her PVCs described as a pounding sensation.  Walks her dog multiple times a week.  Sometimes is able to do so without any problems at all and other times notices SOB with activity which was described similarly in the past.  Today, she tells me that she has had hypotension when she was seeing her pulmonary doctor. We discussed going down on her spirolactone and eventually discontinue it. She tells me her recent Thyroid panel was low:  TSH 0.06, T3 59, T4 9.3. No medication changes were made at that time. She is having fatigue, dizzinesss and lightheadedness. Her son has insulin resistance so she tried to avoid high carbohydrate food and keeps an eye on her sugar. She struggles with migraines which manifests as "broken lines across her vision".   Reports no shortness of breath nor dyspnea on exertion. Reports no chest pain, pressure, or tightness. No edema, orthopnea, PND. Reports no palpitations.    Past Medical History    Past Medical History:  Diagnosis Date   Anxiety    COPD (chronic obstructive pulmonary disease) (HCC)    Fibroids     Hypothyroidism    Palpitations    SVD (spontaneous vaginal delivery)    x 3   Past Surgical History:  Procedure Laterality Date   APPENDECTOMY     AUGMENTATION MAMMAPLASTY     BILATERAL SALPINGECTOMY Bilateral 07/01/2013   Procedure: BILATERAL SALPINGECTOMY;  Surgeon: Lenoard Aden, MD;  Location: [facility];  Service: Gynecology;  Laterality: Bilateral;   BREAST SURGERY     breast reduction x 2   LAPAROSCOPIC APPENDECTOMY N/A 09/09/2020   Procedure: APPENDECTOMY LAPAROSCOPIC;  Surgeon: Kinsinger, De Blanch, MD;  Location: [facility];  Service: General;  Laterality: N/A;   TUBAL LIGATION     WISDOM TOOTH EXTRACTION      Allergies  Allergies  Allergen Reactions   Sulfa Antibiotics Hives   Adhesive [Tape] Itching    Rxn was from [employer].   Codeine Nausea And Vomiting    EKGs/Labs/Other Studies Reviewed:   The following studies were reviewed today:  Echocardiogram 07/18/2022 IMPRESSIONS     1. Left ventricular ejection fraction, by estimation, is 60 to 65%. The  left ventricle has normal function. The left ventricle has no regional  wall motion abnormalities. Left ventricular diastolic parameters were  normal.   2. Right ventricular systolic function is normal. The right ventricular  size is normal.   3. The mitral valve is normal in structure. Trivial mitral valve  regurgitation. No evidence of mitral stenosis.   4. The aortic valve is tricuspid. Aortic valve regurgitation is not  visualized. No aortic stenosis is present.   5. The inferior vena cava is normal in size with greater than 50%  respiratory variability, suggesting right atrial pressure of 3 mmHg.   Comparison(s): No prior Echocardiogram.   FINDINGS   Left Ventricle: Left ventricular ejection fraction, by estimation, is 60  to 65%. The left ventricle has normal function. The left ventricle has no  regional wall motion abnormalities. The left ventricular internal cavity  size was normal in size. There is    no left ventricular hypertrophy. Left ventricular diastolic parameters  were normal.   Right Ventricle: The right ventricular size is normal. Right ventricular  systolic function is normal.   Left Atrium: Left atrial size was normal in size.   Right Atrium: Right atrial size was normal in size.   Pericardium: There is no evidence of pericardial effusion.   Mitral Valve: The mitral valve is normal in structure. Trivial mitral  valve regurgitation. No evidence of mitral valve stenosis.   Tricuspid Valve: The tricuspid valve is normal in structure. Tricuspid  valve regurgitation is trivial. No evidence of tricuspid stenosis.   Aortic Valve: The aortic valve is tricuspid. Aortic valve regurgitation is  not visualized. No aortic stenosis is present.   Pulmonic Valve: The pulmonic valve was normal in structure. Pulmonic valve  regurgitation is trivial. No evidence  of pulmonic stenosis.   Aorta: The aortic root is normal in size and structure.   Venous: The inferior vena cava is normal in size with greater than 50%  respiratory variability, suggesting right atrial pressure of 3 mmHg.   IAS/Shunts: No atrial level shunt detected by color flow Doppler.    EKG:  EKG is not ordered today.    Recent Labs: 07/04/2022: Magnesium 2.2 10/10/2022: ALT 19; BUN 15; Creatinine, Ser 0.84; Hemoglobin 13.5; Platelets 230; Potassium 4.3; Sodium 140  Recent Lipid Panel No results found for: "CHOL", "TRIG", "HDL", "CHOLHDL", "VLDL", "LDLCALC", "LDLDIRECT"  Home Medications   Current Meds  Medication Sig   acetaminophen (TYLENOL) 325 MG tablet Take 2 tablets (650 mg total) by mouth every 6 (six) hours as needed for mild pain, fever or headache.   albuterol (VENTOLIN HFA) 108 (90 Base) MCG/ACT inhaler Inhale into the lungs every 6 (six) hours as needed for wheezing or shortness of breath.   ALTRENO 0.05 % LOTN Apply 0.05 % topically daily.   Calcium Citrate 250 MG TABS Take 1 tablet by mouth daily.    Clindamycin-Benzoyl Per, Refr, gel Apply 1 application topically daily.   cyanocobalamin (,VITAMIN B-12,) 1000 MCG/ML injection Inject 1,000 mcg into the muscle every 28 (twenty-eight) days.   estradiol (ESTRACE) 0.5 MG tablet Take 0.5 mg by mouth daily.   hydrOXYzine (VISTARIL) 25 MG capsule Take 25-50 mg by mouth at bedtime as needed.   levothyroxine (SYNTHROID) 88 MCG tablet Take 88 mcg by mouth every morning.   lidocaine (XYLOCAINE) 2 % solution Use as directed 15 mLs in the mouth or throat as needed for mouth pain.   linaclotide (LINZESS) 145 MCG CAPS capsule Take 145 mg by mouth daily.   minoxidil (LONITEN) 2.5 MG tablet Take 2.5 mg by mouth daily.   Multiple Vitamin (MULTIVITAMIN WITH MINERALS) TABS tablet Take 1 tablet by mouth daily.   progesterone (PROMETRIUM) 200 MG capsule Take 200 mg by mouth at bedtime.   propranolol ER (INDERAL LA) 60 MG 24 hr capsule Take 1 capsule (60 mg total) by mouth daily.   SYMBICORT 160-4.5 MCG/ACT inhaler Inhale 2 puffs into the lungs as needed.   vitamin C (ASCORBIC ACID) 500 MG tablet Take 500 mg by mouth daily.   [DISCONTINUED] spironolactone (ALDACTONE) 25 MG tablet Take 25 mg by mouth daily.     Review of Systems      All other systems reviewed and are otherwise negative except as noted above.  Physical Exam    VS:  BP 90/62   Pulse 81   Ht 5\' 5"  (1.651 m)   Wt 126 lb 14.4 oz (57.6 kg)   SpO2 97%   BMI 21.12 kg/m  , BMI Body mass index is 21.12 kg/m.  Wt Readings from Last 3 Encounters:  03/02/23 126 lb 14.4 oz (57.6 kg)  01/28/23 126 lb 6.4 oz (57.3 kg)  11/19/22 128 lb 3.2 oz (58.2 kg)     GEN: Well nourished, well developed, in no acute distress. HEENT: normal. Neck: Supple, no JVD, carotid bruits, or masses. Cardiac: RRR, no murmurs, rubs, or gallops. No clubbing, cyanosis, edema.  Radials/PT 2+ and equal bilaterally.  Respiratory:  Respirations regular and unlabored, clear to auscultation bilaterally. GI: Soft, nontender,  nondistended. MS: No deformity or atrophy. Skin: Warm and dry, no rash. Neuro:  Strength and sensation are intact. Psych: Normal affect.  Assessment & Plan    PVCs/palpitations -reviewed recent labs and monitor with the patient -She is  on propranolol 60 mg daily which has significantly helped her palpitations -Would continue this current medication regimen, heart rate is 81 today -CBC, CMP, Thyroid panel.   Hypotension -decrease spirolactone to 12.5mg  daily for a week then discontinue -she has been on this medications for years for acne. -Encouraged her to get a blood pressure cuff and keep track an hour after morning medications  Mitral valve regurg (mild) -continue to monitor with annual echos  Dyspnea -only associated with palpitations        Disposition: Follow up 3 months with Jodelle Red, MD or APP.  Signed, Sharlene Dory, PA-C 03/02/2023, 5:33 PM [facility]

## 2023-03-02 ENCOUNTER — Encounter: Payer: Self-pay | Admitting: Physician Assistant

## 2023-03-02 ENCOUNTER — Ambulatory Visit: Payer: Medicaid Other | Attending: Physician Assistant | Admitting: Physician Assistant

## 2023-03-02 VITALS — BP 90/62 | HR 81 | Ht 65.0 in | Wt 126.9 lb

## 2023-03-02 DIAGNOSIS — I340 Nonrheumatic mitral (valve) insufficiency: Secondary | ICD-10-CM | POA: Diagnosis not present

## 2023-03-02 DIAGNOSIS — R0600 Dyspnea, unspecified: Secondary | ICD-10-CM

## 2023-03-02 DIAGNOSIS — I493 Ventricular premature depolarization: Secondary | ICD-10-CM | POA: Diagnosis not present

## 2023-03-02 DIAGNOSIS — R0602 Shortness of breath: Secondary | ICD-10-CM

## 2023-03-02 DIAGNOSIS — R002 Palpitations: Secondary | ICD-10-CM

## 2023-03-02 MED ORDER — SPIRONOLACTONE 25 MG PO TABS: 12.5000 mg | ORAL_TABLET | Freq: Every day | ORAL | Status: DC

## 2023-03-02 NOTE — Patient Instructions (Addendum)
Medication Instructions:  Decrease spironolactone to 12.5 mg daily for one week then stop taking *If you need a refill on your cardiac medications before your next appointment, please call your pharmacy*   Lab Work: CMET, CBC, Thyroid panel-TODAY If you have labs (blood work) drawn today and your tests are completely normal, you will receive your results only by: MyChart Message (if you have MyChart) OR A paper copy in the mail If you have any lab test that is abnormal or we need to change your treatment, we will call you to review the results.  Follow-Up: At [facility], you and your health needs are our priority.  As part of our continuing mission to provide you with exceptional heart care, we have created designated Provider Care Teams.  These Care Teams include your primary Cardiologist (physician) and Advanced Practice Providers (APPs -  Physician Assistants and Nurse Practitioners) who all work together to provide you with the care you need, when you need it.  Your next appointment:   3-4 month(s)  Provider:   Jodelle Red, MD  or Jari Favre, PA-C       Other Instructions Check your blood pressure daily, 1 hr after morning medications for 2 weeks, keep a log and send [place] the readings through mychart at the end of the 2 weeks.   Heart-Healthy Eating Plan Many factors influence your heart health, including eating and exercise habits. Heart health is also called coronary health. Coronary risk increases with abnormal blood fat (lipid) levels. A heart-healthy eating plan includes limiting unhealthy fats, increasing healthy fats, limiting salt (sodium) intake, and making other diet and lifestyle changes. What is my plan? Your health care provider may recommend that: You limit your fat intake to _________% or less of your total calories each day. You limit your saturated fat intake to _________% or less of your total calories each day. You limit the amount of  cholesterol in your diet to less than _________ mg per day. You limit the amount of sodium in your diet to less than _________ mg per day. What are tips for following this plan? Cooking Cook foods using methods other than frying. Baking, boiling, grilling, and broiling are all good options. Other ways to reduce fat include: Removing the skin from poultry. Removing all visible fats from meats. Steaming vegetables in water or broth. Meal planning  At meals, imagine dividing your plate into fourths: Fill one-half of your plate with vegetables and green salads. Fill one-fourth of your plate with whole grains. Fill one-fourth of your plate with lean protein foods. Eat 2-4 cups of vegetables per day. One cup of vegetables equals 1 cup (91 g) broccoli or cauliflower florets, 2 medium carrots, 1 large bell pepper, 1 large sweet potato, 1 large tomato, 1 medium white potato, 2 cups (150 g) raw leafy greens. Eat 1-2 cups of fruit per day. One cup of fruit equals 1 small apple, 1 large banana, 1 cup (237 g) mixed fruit, 1 large orange,  cup (82 g) dried fruit, 1 cup (240 mL) 100% fruit juice. Eat more foods that contain soluble fiber. Examples include apples, broccoli, carrots, beans, peas, and barley. Aim to get 25-30 g of fiber per day. Increase your consumption of legumes, nuts, and seeds to 4-5 servings per week. One serving of dried beans or legumes equals  cup (90 g) cooked, 1 serving of nuts is  oz (12 almonds, 24 pistachios, or 7 walnut halves), and 1 serving of seeds equals  oz (8 g). Fats Choose healthy fats more often. Choose monounsaturated and polyunsaturated fats, such as olive and canola oils, avocado oil, flaxseeds, walnuts, almonds, and seeds. Eat more omega-3 fats. Choose salmon, mackerel, sardines, tuna, flaxseed oil, and ground flaxseeds. Aim to eat fish at least 2 times each week. Check food labels carefully to identify foods with trans fats or high amounts of saturated  fat. Limit saturated fats. These are found in animal products, such as meats, butter, and cream. Plant sources of saturated fats include palm oil, palm kernel oil, and coconut oil. Avoid foods with partially hydrogenated oils in them. These contain trans fats. Examples are stick margarine, some tub margarines, cookies, crackers, and other baked goods. Avoid fried foods. General information Eat more home-cooked food and less restaurant, buffet, and fast food. Limit or avoid alcohol. Limit foods that are high in added sugar and simple starches such as foods made using white refined flour (white breads, pastries, sweets). Lose weight if you are overweight. Losing just 5-10% of your body weight can help your overall health and prevent diseases such as diabetes and heart disease. Monitor your sodium intake, especially if you have high blood pressure. Talk with your health care provider about your sodium intake. Try to incorporate more vegetarian meals weekly. What foods should I eat? Fruits All fresh, canned (in natural juice), or frozen fruits. Vegetables Fresh or frozen vegetables (raw, steamed, roasted, or grilled). Green salads. Grains Most grains. Choose whole wheat and whole grains most of the time. Rice and pasta, including brown rice and pastas made with whole wheat. Meats and other proteins Lean, well-trimmed beef, veal, pork, and lamb. Chicken and [place] without skin. All fish and shellfish. Wild duck, rabbit, pheasant, and venison. Egg whites or low-cholesterol egg substitutes. Dried beans, peas, lentils, and tofu. Seeds and most nuts. Dairy Low-fat or nonfat cheeses, including ricotta and mozzarella. Skim or 1% milk (liquid, powdered, or evaporated). Buttermilk made with low-fat milk. Nonfat or low-fat yogurt. Fats and oils Non-hydrogenated (trans-free) margarines. Vegetable oils, including soybean, sesame, sunflower, olive, avocado, peanut, safflower, corn, canola, and cottonseed.  Salad dressings or mayonnaise made with a vegetable oil. Beverages Water (mineral or sparkling). Coffee and tea. Unsweetened ice tea. Diet beverages. Sweets and desserts Sherbet, gelatin, and fruit ice. Small amounts of dark chocolate. Limit all sweets and desserts. Seasonings and condiments All seasonings and condiments. The items listed above may not be a complete list of foods and beverages you can eat. Contact a dietitian for more options. What foods should I avoid? Fruits Canned fruit in heavy syrup. Fruit in cream or butter sauce. Fried fruit. Limit coconut. Vegetables Vegetables cooked in cheese, cream, or butter sauce. Fried vegetables. Grains Breads made with saturated or trans fats, oils, or whole milk. Croissants. Sweet rolls. Donuts. High-fat crackers, such as cheese crackers and chips. Meats and other proteins Fatty meats, such as hot dogs, ribs, sausage, bacon, rib-eye roast or steak. High-fat deli meats, such as salami and bologna. Caviar. Domestic duck and goose. Organ meats, such as liver. Dairy Cream, sour cream, cream cheese, and creamed cottage cheese. Whole-milk cheeses. Whole or 2% milk (liquid, evaporated, or condensed). Whole buttermilk. Cream sauce or high-fat cheese sauce. Whole-milk yogurt. Fats and oils Meat fat, or shortening. Cocoa butter, hydrogenated oils, palm oil, coconut oil, palm kernel oil. Solid fats and shortenings, including bacon fat, salt pork, lard, and butter. Nondairy cream substitutes. Salad dressings with cheese or sour cream. Beverages Regular sodas and any drinks with added  sugar. Sweets and desserts Frosting. Pudding. Cookies. Cakes. Pies. Milk chocolate or white chocolate. Buttered syrups. Full-fat ice cream or ice cream drinks. The items listed above may not be a complete list of foods and beverages to avoid. Contact a dietitian for more information. Summary Heart-healthy meal planning includes limiting unhealthy fats, increasing healthy  fats, limiting salt (sodium) intake and making other diet and lifestyle changes. Lose weight if you are overweight. Losing just 5-10% of your body weight can help your overall health and prevent diseases such as diabetes and heart disease. Focus on eating a balance of foods, including fruits and vegetables, low-fat or nonfat dairy, lean protein, nuts and legumes, whole grains, and heart-healthy oils and fats. This information is not intended to replace advice given to you by your health care provider. Make sure you discuss any questions you have with your health care provider. Document Revised: 07/01/2021 Document Reviewed: 07/01/2021 Elsevier Patient Education  2024 [employer].

## 2023-03-03 LAB — CBC
Hematocrit: 40.9 % (ref 34.0–46.6)
Hemoglobin: 13.6 g/dL (ref 11.1–15.9)
MCH: 32.3 pg (ref 26.6–33.0)
MCHC: 33.3 g/dL (ref 31.5–35.7)
MCV: 97 fL (ref 79–97)
Platelets: 270 10*3/uL (ref 150–450)
RBC: 4.21 x10E6/uL (ref 3.77–5.28)
RDW: 12.3 % (ref 11.7–15.4)
WBC: 7.7 10*3/uL (ref 3.4–10.8)

## 2023-03-03 LAB — THYROID PANEL WITH TSH
Free Thyroxine Index: 2.9 (ref 1.2–4.9)
T3 Uptake Ratio: 30 % (ref 24–39)
T4, Total: 9.6 ug/dL (ref 4.5–12.0)
TSH: 0.194 u[IU]/mL — ABNORMAL LOW (ref 0.450–4.500)

## 2023-03-03 LAB — COMPREHENSIVE METABOLIC PANEL
ALT: 10 IU/L (ref 0–32)
AST: 14 IU/L (ref 0–40)
Albumin: 4.2 g/dL (ref 3.8–4.9)
Alkaline Phosphatase: 69 IU/L (ref 44–121)
BUN/Creatinine Ratio: 27 — ABNORMAL HIGH (ref 9–23)
BUN: 24 mg/dL (ref 6–24)
Bilirubin Total: 0.3 mg/dL (ref 0.0–1.2)
CO2: 23 mmol/L (ref 20–29)
Calcium: 9 mg/dL (ref 8.7–10.2)
Chloride: 104 mmol/L (ref 96–106)
Creatinine, Ser: 0.9 mg/dL (ref 0.57–1.00)
Globulin, Total: 2 g/dL (ref 1.5–4.5)
Glucose: 86 mg/dL (ref 70–99)
Potassium: 4.5 mmol/L (ref 3.5–5.2)
Sodium: 140 mmol/L (ref 134–144)
Total Protein: 6.2 g/dL (ref 6.0–8.5)
eGFR: 75 mL/min/{1.73_m2} (ref >59)

## 2023-03-20 ENCOUNTER — Encounter: Payer: Self-pay | Admitting: Cardiology

## 2023-04-07 ENCOUNTER — Ambulatory Visit
Admission: RE | Admit: 2023-04-07 | Discharge: 2023-04-07 | Disposition: A | Discharge status: Home / Self Care | Payer: Medicaid Other | Source: Ambulatory Visit | Attending: Family Medicine | Admitting: Family Medicine

## 2023-04-07 DIAGNOSIS — Z1231 Encounter for screening mammogram for malignant neoplasm of breast: Secondary | ICD-10-CM | POA: Diagnosis present

## 2023-04-16 ENCOUNTER — Ambulatory Visit: Payer: Medicaid Other | Admitting: Emergency Medicine

## 2023-04-16 DIAGNOSIS — R0602 Shortness of breath: Secondary | ICD-10-CM

## 2023-04-16 LAB — PULMONARY FUNCTION TEST
DL/VA % pred: 68 %
DL/VA: 2.89 ml/min/mmHg/L
DLCO cor % pred: 55 %
DLCO cor: 11.7 ml/min/mmHg
DLCO unc % pred: 55 %
DLCO unc: 11.7 ml/min/mmHg
FEF 25-75 Post: 2.08 L/s
FEF 25-75 Pre: 1.62 L/s
FEF2575-%Change-Post: 28 %
FEF2575-%Pred-Post: 82 %
FEF2575-%Pred-Pre: 63 %
FEV1-%Change-Post: 7 %
FEV1-%Pred-Post: 78 %
FEV1-%Pred-Pre: 72 %
FEV1-Post: 2.14 L
FEV1-Pre: 1.99 L
FEV1FVC-%Change-Post: 7 %
FEV1FVC-%Pred-Pre: 93 %
FEV6-%Change-Post: 0 %
FEV6-%Pred-Post: 79 %
FEV6-%Pred-Pre: 79 %
FEV6-Post: 2.7 L
FEV6-Pre: 2.7 L
FEV6FVC-%Pred-Post: 103 %
FEV6FVC-%Pred-Pre: 103 %
FVC-%Change-Post: 0 %
FVC-%Pred-Post: 77 %
FVC-%Pred-Pre: 77 %
FVC-Post: 2.7 L
FVC-Pre: 2.7 L
Post FEV1/FVC ratio: 79 %
Post FEV6/FVC ratio: 100 %
Pre FEV1/FVC ratio: 74 %
Pre FEV6/FVC Ratio: 100 %
RV % pred: 92 %
RV: 1.84 L
TLC % pred: 87 %
TLC: 4.53 L

## 2023-04-16 NOTE — Patient Instructions (Signed)
Full PFT performed today. °

## 2023-04-16 NOTE — Progress Notes (Signed)
Full PFT performed today. °

## 2023-04-20 ENCOUNTER — Ambulatory Visit: Payer: Medicaid Other | Admitting: Pulmonary Disease

## 2023-04-20 ENCOUNTER — Encounter: Payer: Self-pay | Admitting: Pulmonary Disease

## 2023-04-20 VITALS — BP 106/71 | HR 81 | Ht 64.0 in | Wt 123.4 lb

## 2023-04-20 DIAGNOSIS — R0602 Shortness of breath: Secondary | ICD-10-CM | POA: Diagnosis not present

## 2023-04-20 DIAGNOSIS — J849 Interstitial pulmonary disease, unspecified: Secondary | ICD-10-CM | POA: Diagnosis not present

## 2023-04-20 NOTE — Progress Notes (Signed)
Nichole Manning    [id number]    06/14/1965  Primary Care Physician:Lindley, Fernand Parkins, FNP  Referring Physician: Armando Gang, FNP [address] [place],  [place] [address]  Chief complaint: Consult for abnormal CT, evaluation for COPD  HPI: 57 y.o. who  has a past medical history of Anxiety, COPD (chronic obstructive pulmonary disease) (HCC), Fibroids, Hypothyroidism, Palpitations, and SVD (spontaneous vaginal delivery).   Referred for evaluation of COPD and dyspnea with abnormal CT scan. She had history of COVID infection in June 2020 and was evaluated in the pulmonary office with cough, shortness of breath, brain fog and memory loss.  Had a CT at that time showed subtle basal changes.  PFTs were ordered as follow-up but she did not go through with the test  Now referred back with increasing dyspnea on exertion.  She has occasional cough.  No mucus production or wheezing.  She had a follow-up high-res CT recently which showed increased groundglass opacities at the base.  Pets: Dog Occupation: Retired.  Used to work in legal Exposures: She has a feather pillow on her living room so far.  No mold, hot tub, Jacuzzi. Smoking history: 45-pack-year smoker.  Quit in 2021 and started vaping Travel history: No significant travel history Relevant family history: No family history of lung disease  Interim History Discussed the use of AI scribe software for clinical note transcription with the patient, who gave verbal consent to proceed.  The patient, with a history of smoking and COVID-19 infection in 2020, presents with ongoing shortness of breath and fatigue. She reports waking up with headaches and experiencing difficulty sleeping. She also mentions feeling generally unwell and tired, even after performing simple tasks such as taking a shower. She has tried walking a mile a day for exercise, but reports feeling winded, especially when encountering hills. She also expresses  concerns about her mental health, noting feelings of depression related to life events, but is not currently on any medication for this.  The patient has quit smoking but continues to vape, which she credits with helping her quit smoking. She has also made changes to her environment, such as removing down pillows, after previous discussions about potential lung irritants. She has been prescribed Spiriva, Symbicort, and ProAir for her respiratory symptoms, but reports that these medications do not seem to make a significant difference. She is currently only using Spiriva.  She has undergone a lung function test, the results of which have caused her some concern. She reports that her primary care provider, a nurse practitioner, has diagnosed her with COPD, but she is unsure about this diagnosis.   Outpatient Encounter Medications as of 04/20/2023  Medication Sig   acetaminophen (TYLENOL) 325 MG tablet Take 2 tablets (650 mg total) by mouth every 6 (six) hours as needed for mild pain, fever or headache.   albuterol (VENTOLIN HFA) 108 (90 Base) MCG/ACT inhaler Inhale into the lungs every 6 (six) hours as needed for wheezing or shortness of breath.   ALTRENO 0.05 % LOTN Apply 0.05 % topically daily.   BEVESPI AEROSPHERE 9-4.8 MCG/ACT AERO Inhale 2 puffs into the lungs in the morning and at bedtime.   Calcium Citrate 250 MG TABS Take 1 tablet by mouth daily.   Clindamycin-Benzoyl Per, Refr, gel Apply 1 application topically daily.   cyanocobalamin (,VITAMIN B-12,) 1000 MCG/ML injection Inject 1,000 mcg into the muscle every 28 (twenty-eight) days.   estradiol (ESTRACE) 0.5 MG tablet Take  0.5 mg by mouth daily.   hydrOXYzine (VISTARIL) 25 MG capsule Take 25-50 mg by mouth at bedtime as needed.   levothyroxine (SYNTHROID) 88 MCG tablet Take 88 mcg by mouth every morning.   lidocaine (XYLOCAINE) 2 % solution Use as directed 15 mLs in the mouth or throat as needed for mouth pain.   linaclotide (LINZESS) 145  MCG CAPS capsule Take 145 mg by mouth daily.   minoxidil (LONITEN) 2.5 MG tablet Take 2.5 mg by mouth daily.   Multiple Vitamin (MULTIVITAMIN WITH MINERALS) TABS tablet Take 1 tablet by mouth daily.   progesterone (PROMETRIUM) 200 MG capsule Take 200 mg by mouth at bedtime.   propranolol ER (INDERAL LA) 60 MG 24 hr capsule Take 1 capsule (60 mg total) by mouth daily.   spironolactone (ALDACTONE) 25 MG tablet Take 0.5 tablets (12.5 mg total) by mouth daily. Take for one week, then stop taking.   SYMBICORT 160-4.5 MCG/ACT inhaler Inhale 2 puffs into the lungs as needed.   vitamin C (ASCORBIC ACID) 500 MG tablet Take 500 mg by mouth daily.   No facility-administered encounter medications on file as of 04/20/2023.   Physical Exam: Blood pressure 120/60, pulse 64, temperature 97.8 F (36.6 C), temperature source Oral, height 5\' 5"  (1.651 m), weight 126 lb 6.4 oz (57.3 kg), SpO2 100%. Gen:      No acute distress HEENT:  EOMI, sclera anicteric Neck:     No masses; no thyromegaly Lungs:    Clear to auscultation bilaterally; normal respiratory effort CV:         Regular rate and rhythm; no murmurs Abd:      + bowel sounds; soft, non-tender; no palpable masses, no distension Ext:    No edema; adequate peripheral perfusion Skin:      Warm and dry; no rash Neuro: alert and oriented x 3 Psych: normal mood and affect  Data Reviewed: Imaging: High-res CT 12/09/2019-subtle changes in the lung base bilaterally, mild diffuse bronchial wall thickening with mild emphysema High-res CT 12/02/2022-peripheral predominant groundglass attenuation and mild septal thickening.  Indeterminate pattern.  I have reviewed the images personally.  PFTs: 04/16/2023 FVC 2.70 [77%], FEV1 2.14 [78%], F/F79, TLC 4.53 [87%], DLCO 11.70 [55%] Moderate diffusion impairment  Labs: CBC 10/10/2022-WBC 6.3, eos 1%, absolute eosinophil count 63 CTD serologies 01/28/2023-, CK, Jo 1, RNP, hypersensitive panel, Sjogren's antibody,  aldolase-negative  Echocardiogram Cardiac 07/18/2022-LVEF 60-65%, normal RV systolic size and function.  Trivial TR.  Assessment:  Interstitial Lung Disease Progressive changes on CT scan from 2021 to 2024 with worsening shortness of breath and fatigue. No clear etiology identified. Autoimmune workup negative. No significant acid reflux. Vaping and prior COVID-19 infection considered as potential contributing factors.  She has minimal exposure to down pillows but she has gotten rid of it since prior visit. We had a detailed discussion about workup today -Discuss case in multidisciplinary conference. -Consider lung biopsy for definitive diagnosis. -Order additional labs as ANA, rheumatoid factor, CCP and myositis panel were not drawn at last visit  Emphysema Mild emphysema noted on CT scan.  No evidence of obstruction on PFTs. Currently on Spiriva, with history of using Albuterol and Symbicort. Reports minimal benefit. -Consider switching to Symbicort if inhaler therapy is to be continued.   Reduced diffusion capacity PFTs reviewed which shows reduced diffusion capacity which may indicate pulmonary vascular disease.  However echocardiogram earlier this year does not show any evidence of such. Continue monitoring.  Depression Reports feeling "bleh" and tired, with some  life event-related depression. Not currently on any medication for depression. -Consider evaluation for potential treatment if symptoms persist or worsen.  Smoking Cessation Quit smoking but currently vaping. -Encourage cessation of vaping due to potential lung damage.  Plan/Recommendations: Discuss at multidisciplinary conference Additional CTD serologies  Chilton Greathouse MD [facility] 04/20/2023, 9:29 AM  CC: Armando Gang, FNP

## 2023-04-20 NOTE — Patient Instructions (Signed)
VISIT SUMMARY:  During today's visit, we discussed your ongoing shortness of breath, fatigue, and other symptoms. We reviewed your history of smoking, vaping, and previous COVID-19 infection. We also talked about your mental health concerns and current medications. We have a plan to address each of these issues and will continue to monitor your progress closely.  YOUR PLAN:  -INTERSTITIAL LUNG DISEASE: Interstitial Lung Disease is a group of disorders that cause scarring of the lungs, leading to breathing difficulties. We have noted progressive changes in your lung scans and will discuss your case in a multidisciplinary conference. We may consider a lung biopsy for a definitive diagnosis and will order additional labs to gather more information.  -DEPRESSION: Depression is a mental health condition characterized by persistent feelings of sadness and loss of interest. You have reported feeling generally unwell and tired, with some life event-related depression. If your symptoms persist or worsen, we will consider evaluating you for potential treatment options.  -SMOKING CESSATION: Smoking cessation is the process of quitting smoking. You have successfully quit smoking but are currently vaping. We encourage you to stop vaping as it can still cause lung damage.  -INHALER USE: Inhalers are devices that deliver medication directly to the lungs to help with breathing. You are currently using Spiriva but have reported minimal benefit. We may consider switching you to Symbicort if inhaler therapy is to be continued.  INSTRUCTIONS:  We will discuss your case in a multidisciplinary conference and may consider a lung biopsy for a definitive diagnosis. Additional labs will be ordered. If your depression symptoms persist or worsen, we will evaluate you for potential treatment options. Please continue to avoid vaping to protect your lung health.

## 2023-04-22 LAB — ANA,IFA RA DIAG PNL W/RFLX TIT/PATN
Anti Nuclear Antibody (ANA): NEGATIVE
Cyclic Citrullin Peptide Ab: <16 U
Rheumatoid fact SerPl-aCnc: <10 [IU]/mL (ref <14)

## 2023-04-22 LAB — ANTI-SCLERODERMA ANTIBODY: Scleroderma (Scl-70) (ENA) Antibody, IgG: <1 AI

## 2023-05-09 ENCOUNTER — Other Ambulatory Visit: Payer: Self-pay | Admitting: Physician Assistant

## 2023-05-11 ENCOUNTER — Telehealth: Payer: Self-pay | Admitting: Cardiology

## 2023-05-11 MED ORDER — PROPRANOLOL HCL ER 60 MG PO CP24: 60.0000 mg | ORAL_CAPSULE | Freq: Every day | ORAL | 6 refills | Status: DC

## 2023-05-11 NOTE — Telephone Encounter (Signed)
*  STAT* If patient is at the pharmacy, call can be transferred to refill team.   1. Which medications need to be refilled? (please list name of each medication and dose if known) propranolol ER (INDERAL LA) 60 MG 24 hr capsule   2. Which pharmacy/location (including street and city if local pharmacy) is medication to be sent to?  [id number] - [place], [place] - [address]    3. Do they need a 30 day or 90 day supply? 90

## 2023-05-14 LAB — MYOMARKER 3 PLUS PROFILE (RDL)

## 2023-05-19 ENCOUNTER — Ambulatory Visit: Payer: Medicaid Other | Attending: Physician Assistant | Admitting: Physician Assistant

## 2023-05-19 ENCOUNTER — Encounter: Payer: Self-pay | Admitting: Physician Assistant

## 2023-05-19 ENCOUNTER — Ambulatory Visit: Payer: Medicaid Other | Admitting: Physician Assistant

## 2023-05-19 VITALS — BP 102/72 | HR 92 | Ht 64.0 in | Wt 126.8 lb

## 2023-05-19 DIAGNOSIS — R002 Palpitations: Secondary | ICD-10-CM

## 2023-05-19 DIAGNOSIS — R0600 Dyspnea, unspecified: Secondary | ICD-10-CM

## 2023-05-19 DIAGNOSIS — I340 Nonrheumatic mitral (valve) insufficiency: Secondary | ICD-10-CM | POA: Diagnosis not present

## 2023-05-19 DIAGNOSIS — I959 Hypotension, unspecified: Secondary | ICD-10-CM

## 2023-05-19 DIAGNOSIS — I493 Ventricular premature depolarization: Secondary | ICD-10-CM | POA: Diagnosis not present

## 2023-05-19 NOTE — Progress Notes (Signed)
Office Visit    Patient Name: Nichole Manning Date of Encounter: 05/19/2023  PCP:  Armando Gang, FNP   [facility]  Cardiologist:  Jodelle Red, MD  Advanced Practice Provider:  No care team member to display Electrophysiologist:  None }  HPI    Nichole Manning is a 57 y.o. female with a past medical history of anxiety, COPD, hypothyroidism, palpitations, PSVT, PAC, PVC, mild MR presents today for follow-up appointment.  She was last seen in the clinic by [name], PA-C 07/04/2022.  History of mild LVH and mild MR on echocardiogram.  Seen for palpitations as well.  Monitor 8/21 with heart rate 55 to 144 bpm, average 79 bpm, predominantly NSR, 1 5 beat SVT, rare less than 1% PACs, PVCs, 2 triggered events with sinus with PVCs.  Lipids managed by primary care.  When she was last seen it was for a preop evaluation for elective blepharoplasty.  She was regularly seen by PCP and found to have an abnormal EKG.  EKG from the PCP visit showed normal sinus rhythm 83 bpm with frequent PVCs, nonspecific ST TW changes otherwise in precordial leads.  EKG at our appointment showed normal sinus rhythm with resolution of ST TW changes anteriorly and no further activity.  She reports occasional palpitations about 1 time per week but not particular bothersome.  Propranolol helps but she does not take this often.  Noticed occasional dyspnea with exertion or showering.  No chest pain or syncope.  Thyroid function is followed by endocrinology with lab work just 2 weeks prior therefore they were not repeated.  She was seen by me February 2024, she states she mailed her zio in a few weeks ago and has not heard anything. We reviewed her monitor with her. She has been on spirolactone for years for acne. She smoked for 40 years and quit in 2020. She has a 74 year old child and is worried about heart disease. She does not know her family history since she is adopted. We discussed  the need for an ischemic workup. She was to have a coronary CTA but she had too much ectopy to complete the test.   Ultimately, ETT was performed and she was able to achieve 9.2 METS and was negative.  Did not wish to add a beta-blocker.  On spironolactone for acne and minoxidil for hair growth.  Requested sleep study due to fatigue which was negative.  She was seen in the ED 10/10/2022 with odd sensation over her left cheek and possible subtle facial droop.  Brain MRI was normal and she was advised to follow-up with her plastic surgeon.  She states she did so and she was told she may have Bell's palsy.  Since April she has been having some issues with her feet and palms sweating.  States she seen her PCP and endocrinologist and told it was possibly stress.  Prescribed a trial of hydroxyzine.  She does have stress at home with a teenage son.  Reports periodically feeling her PVCs described as a pounding sensation.  Walks her dog multiple times a week.  Sometimes is able to do so without any problems at all and other times notices SOB with activity which was described similarly in the past.  She was seen by me 02/2023, she tells me that she has had hypotension when she was seeing her pulmonary doctor. We discussed going down on her spirolactone and eventually discontinue it. She tells me her  recent Thyroid panel was low: TSH 0.06, T3 59, T4 9.3. No medication changes were made at that time. She is having fatigue, dizzinesss and lightheadedness. Her son has insulin resistance so she tried to avoid high carbohydrate food and keeps an eye on her sugar. She struggles with migraines which manifests as "broken lines across her vision".   Today, she presents with a history of smoking, COVID-19 infection, and palpitations with concerns about low blood pressure. The patient reports that her blood pressure has been running low, with readings as low as 102/72. The patient experiences occasional lightheadedness and dizziness,  which she attributes to the low blood pressure. The patient is currently on propranolol for palpitations and Spiriva for lung issues.  The patient also reports weight gain after stopping spironolactone, which was initially prescribed for high cortisol levels. The patient expresses concern about the need for medication to manage her cortisol levels.  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 The patient reports using 72% of her lung capacity and is considering a lung biopsy. The patient denies experiencing any significant shortness of breath or other respiratory symptoms  Discussed the use of AI scribe software for clinical note transcription with the patient, who gave verbal consent to proceed.  Reports no shortness of breath nor dyspnea on exertion. Reports no chest pain, pressure, or tightness. No edema, orthopnea, PND. Reports no palpitations.    Past Medical History    Past Medical History:  Diagnosis Date   Anxiety    COPD (chronic obstructive pulmonary disease) (HCC)    Fibroids    Hypothyroidism    Palpitations    SVD (spontaneous vaginal delivery)    x 3   Past Surgical History:  Procedure Laterality Date   APPENDECTOMY     AUGMENTATION MAMMAPLASTY     BILATERAL SALPINGECTOMY Bilateral 07/01/2013   Procedure: BILATERAL SALPINGECTOMY;  Surgeon: Lenoard Aden, MD;  Location: [facility];  Service: Gynecology;  Laterality: Bilateral;   BREAST SURGERY     breast reduction x 2   LAPAROSCOPIC APPENDECTOMY N/A 09/09/2020   Procedure: APPENDECTOMY LAPAROSCOPIC;  Surgeon: Kinsinger, De Blanch, MD;  Location: [facility] OR;  Service: General;  Laterality: N/A;   TUBAL LIGATION     WISDOM TOOTH EXTRACTION      Allergies  Allergies  Allergen Reactions   Sulfa Antibiotics Hives   Adhesive [Tape] Itching    Rxn was from Nail Glue.   Codeine Nausea And Vomiting    EKGs/Labs/Other Studies Reviewed:   The  following studies were reviewed today:  Echocardiogram 07/18/2022 IMPRESSIONS     1. Left ventricular ejection fraction, by estimation, is 60 to 65%. The  left ventricle has normal function. The left ventricle has no regional  wall motion abnormalities. Left ventricular diastolic parameters were  normal.   2. Right ventricular systolic function is normal. The right ventricular  size is normal.   3. The mitral valve is normal in structure. Trivial mitral valve  regurgitation. No evidence of mitral stenosis.   4. The aortic valve is tricuspid. Aortic valve regurgitation is not  visualized. No aortic stenosis is present.   5. The inferior vena cava is normal in size with greater than 50%  respiratory variability, suggesting right atrial pressure of 3 mmHg.   Comparison(s): No prior Echocardiogram.   FINDINGS   Left Ventricle: Left ventricular ejection fraction, by estimation, is 60  to 65%. The left ventricle has normal function.  The left ventricle has no  regional wall motion abnormalities. The left ventricular internal cavity  size was normal in size. There is   no left ventricular hypertrophy. Left ventricular diastolic parameters  were normal.   Right Ventricle: The right ventricular size is normal. Right ventricular  systolic function is normal.   Left Atrium: Left atrial size was normal in size.   Right Atrium: Right atrial size was normal in size.   Pericardium: There is no evidence of pericardial effusion.   Mitral Valve: The mitral valve is normal in structure. Trivial mitral  valve regurgitation. No evidence of mitral valve stenosis.   Tricuspid Valve: The tricuspid valve is normal in structure. Tricuspid  valve regurgitation is trivial. No evidence of tricuspid stenosis.   Aortic Valve: The aortic valve is tricuspid. Aortic valve regurgitation is  not visualized. No aortic stenosis is present.   Pulmonic Valve: The pulmonic valve was normal in structure. Pulmonic  valve  regurgitation is trivial. No evidence of pulmonic stenosis.   Aorta: The aortic root is normal in size and structure.   Venous: The inferior vena cava is normal in size with greater than 50%  respiratory variability, suggesting right atrial pressure of 3 mmHg.   IAS/Shunts: No atrial level shunt detected by color flow Doppler.    EKG:  EKG is not ordered today.    Recent Labs: 07/04/2022: Magnesium 2.2 03/02/2023: ALT 10; BUN 24; Creatinine, Ser 0.90; Hemoglobin 13.6; Platelets 270; Potassium 4.5; Sodium 140; TSH 0.194  Recent Lipid Panel No results found for: "CHOL", "TRIG", "HDL", "CHOLHDL", "VLDL", "LDLCALC", "LDLDIRECT"  Home Medications   Current Meds  Medication Sig   acetaminophen (TYLENOL) 325 MG tablet Take 2 tablets (650 mg total) by mouth every 6 (six) hours as needed for mild pain, fever or headache.   ALTRENO 0.05 % LOTN Apply 0.05 % topically daily.   Calcium Citrate 250 MG TABS Take 1 tablet by mouth daily.   Clindamycin-Benzoyl Per, Refr, gel Apply 1 application topically daily.   cyanocobalamin (,VITAMIN B-12,) 1000 MCG/ML injection Inject 1,000 mcg into the muscle every 28 (twenty-eight) days.   estradiol (ESTRACE) 0.5 MG tablet Take 0.5 mg by mouth daily.   hydrOXYzine (VISTARIL) 25 MG capsule Take 25-50 mg by mouth at bedtime as needed.   levothyroxine (SYNTHROID) 88 MCG tablet Take 88 mcg by mouth every morning.   lidocaine (XYLOCAINE) 2 % solution Use as directed 15 mLs in the mouth or throat as needed for mouth pain.   linaclotide (LINZESS) 145 MCG CAPS capsule Take 145 mg by mouth daily.   minoxidil (LONITEN) 2.5 MG tablet Take 2.5 mg by mouth daily.   Multiple Vitamin (MULTIVITAMIN WITH MINERALS) TABS tablet Take 1 tablet by mouth daily.   OZEMPIC, 0.25 OR 0.5 MG/DOSE, 2 MG/3ML SOPN Inject 0.25 mg as directed once a week.   progesterone (PROMETRIUM) 200 MG capsule Take 200 mg by mouth at bedtime.   propranolol ER (INDERAL LA) 60 MG 24 hr capsule Take  1 capsule (60 mg total) by mouth daily.   SPIRIVA RESPIMAT 2.5 MCG/ACT AERS Inhale 2 puffs into the lungs daily at 6 (six) AM.   spironolactone (ALDACTONE) 25 MG tablet Take 0.5 tablets (12.5 mg total) by mouth daily. Take for one week, then stop taking.   SYMBICORT 160-4.5 MCG/ACT inhaler Inhale 2 puffs into the lungs as needed.   valACYclovir (VALTREX) 500 MG tablet Take 500 mg by mouth daily.   vitamin C (ASCORBIC ACID) 500 MG tablet  Take 500 mg by mouth daily.     Review of Systems      All other systems reviewed and are otherwise negative except as noted above.  Physical Exam    VS:  BP 102/72   Pulse 92   Ht 5\' 4"  (1.626 m)   Wt 126 lb 12.8 oz (57.5 kg)   SpO2 100%   BMI 21.77 kg/m  , BMI Body mass index is 21.77 kg/m.  Wt Readings from Last 3 Encounters:  05/19/23 126 lb 12.8 oz (57.5 kg)  04/20/23 123 lb 6.4 oz (56 kg)  03/02/23 126 lb 14.4 oz (57.6 kg)     GEN: Well nourished, well developed, in no acute distress. HEENT: normal. Neck: Supple, no JVD, carotid bruits, or masses. Cardiac: RRR, no murmurs, rubs, or gallops. No clubbing, cyanosis, edema.  Radials/PT 2+ and equal bilaterally.  Respiratory:  Respirations regular and unlabored, clear to auscultation bilaterally. GI: Soft, nontender, nondistended. MS: No deformity or atrophy. Skin: Warm and dry, no rash. Neuro:  Strength and sensation are intact. Psych: Normal affect.  Assessment & Plan    Hypotension Persistent low blood pressure readings, possibly related to Propranolol use. Mild symptoms of lightheadedness reported. -Continue monitoring blood pressure at home. -Consider reducing Propranolol dose if symptoms persist or worsen.  Hyperhidrosis and Palpitations Symptoms improved since starting Propranolol. -Continue Propranolol for heart rate control and symptom management.  Hyperaldosteronism? Discontinued Spironolactone due to concerns about low blood pressure. Noted weight gain since  discontinuation. -Contact endocrinologist to consider rechecking cortisol levels and discuss potential alternative medications.  Hyperlipidemia Last cholesterol panel showed elevated levels. -Order lipid panel. -Consider starting statin therapy if levels remain elevated.  Interstitial Lung Disease Mild disease noted on recent high-resolution chest CT, possibly post-COVID or related to past smoking history. -Continue follow-up with pulmonologist for further management.  Preoperative Clearance Son scheduled for surgery next week. -Provide cardiac clearance if requested by surgical team.      Disposition: Follow up 3 months with Jodelle Red, MD or APP.  Signed, Sharlene Dory, PA-C 05/19/2023, 4:52 PM  [facility]

## 2023-05-19 NOTE — Patient Instructions (Signed)
Medication Instructions:   Your physician recommends that you continue on your current medications as directed. Please refer to the Current Medication list given to you today.   *If you need a refill on your cardiac medications before your next appointment, please call your pharmacy*   Lab Work: LIPIDS TOMORROW  PLEASE GO DOWN STAIRS FIRST FLOOR  [address] LABS NEEDED BMET CBC AND PT-INR TODAY     If you have labs (blood work) drawn today and your tests are completely normal, you will receive your results only by: MyChart Message (if you have MyChart) OR A paper copy in the mail If you have any lab test that is abnormal or we need to change your treatment, we will call you to review the results.   Testing/Procedures:  NONE ORDERED  TODAY    Follow-Up: At [facility], you and your health needs are our priority.  As part of our continuing mission to provide you with exceptional heart care, we have created designated Provider Care Teams.  These Care Teams include your primary Cardiologist (physician) and Advanced Practice Providers (APPs -  Physician Assistants and Nurse Practitioners) who all work together to provide you with the care you need, when you need it.  We recommend signing up for the patient portal called "MyChart".  Sign up information is provided on this After Visit Summary.  MyChart is used to connect with patients for Virtual Visits (Telemedicine).  Patients are able to view lab/test results, encounter notes, upcoming appointments, etc.  Non-urgent messages can be sent to your provider as well.   To learn more about what you can do with MyChart, go to [email or web address].    Your next appointment:   3 month(s)  Provider:   Jodelle Red, MD     Other Instructions

## 2023-05-21 LAB — LIPID PANEL
Chol/HDL Ratio: 2.9 {ratio} (ref 0.0–4.4)
Cholesterol, Total: 210 mg/dL — ABNORMAL HIGH (ref 100–199)
HDL: 73 mg/dL (ref >39)
LDL Chol Calc (NIH): 112 mg/dL — ABNORMAL HIGH (ref 0–99)
Triglycerides: 144 mg/dL (ref 0–149)
VLDL Cholesterol Cal: 25 mg/dL (ref 5–40)

## 2023-05-22 ENCOUNTER — Encounter: Payer: Self-pay | Admitting: Physician Assistant

## 2023-05-22 DIAGNOSIS — E785 Hyperlipidemia, unspecified: Secondary | ICD-10-CM

## 2023-05-25 MED ORDER — ROSUVASTATIN CALCIUM 5 MG PO TABS: 5.0000 mg | ORAL_TABLET | Freq: Every day | ORAL | 3 refills | Status: DC

## 2023-05-25 NOTE — Telephone Encounter (Signed)
Spoke with patient, agrees with plan to start crestor 5 mg daily and repeat lipids and LFT in march (08/21/23 prior to next appt). No questions at this time   Bunnie Domino to Me    05/22/23  4:15 PM Lets go ahead and start her on Crestor 5 mg daily and please order a repeat lipid panel and LFTs in 3 months.  Thank you! Sharlene Dory, PA-C

## 2023-05-27 ENCOUNTER — Ambulatory Visit: Payer: Medicaid Other | Admitting: Neurology

## 2023-06-08 ENCOUNTER — Telehealth: Payer: Self-pay | Admitting: Pulmonary Disease

## 2023-06-08 NOTE — Telephone Encounter (Signed)
I reviewed her chart.  With the significant concern for progressive interstitial lung disease I think she would best be served by staying with Dr. Isaiah Serge since he is a part of our [facility] clinic.

## 2023-06-08 NOTE — Telephone Encounter (Signed)
Patient would like to change from Dr.Mannam to Dr.Byrum. Is this switch okay?  Patient can be reached at [phone]

## 2023-06-10 DIAGNOSIS — K638219 Small intestinal bacterial overgrowth, unspecified: Secondary | ICD-10-CM

## 2023-06-10 HISTORY — DX: Small intestinal bacterial overgrowth, unspecified: K63.8219

## 2023-06-15 NOTE — Telephone Encounter (Signed)
 ATC patient.  Left detailed message on VM (DPR) with Dr. Kavin Leech recommendations.  Patient does have follow up scheduled with Dr. Isaiah Serge 07/29/23 at 1415.  Call back given for any questions and concerns.

## 2023-07-10 ENCOUNTER — Ambulatory Visit: Payer: Medicaid Other | Admitting: Internal Medicine

## 2023-07-10 ENCOUNTER — Encounter: Payer: Self-pay | Admitting: Internal Medicine

## 2023-07-10 VITALS — BP 92/68 | HR 72 | Ht 64.0 in | Wt 124.4 lb

## 2023-07-10 DIAGNOSIS — R12 Heartburn: Secondary | ICD-10-CM

## 2023-07-10 DIAGNOSIS — R1310 Dysphagia, unspecified: Secondary | ICD-10-CM | POA: Diagnosis not present

## 2023-07-10 DIAGNOSIS — G43709 Chronic migraine without aura, not intractable, without status migrainosus: Secondary | ICD-10-CM

## 2023-07-10 DIAGNOSIS — K5909 Other constipation: Secondary | ICD-10-CM | POA: Diagnosis not present

## 2023-07-10 DIAGNOSIS — Z1211 Encounter for screening for malignant neoplasm of colon: Secondary | ICD-10-CM

## 2023-07-10 MED ORDER — OMEPRAZOLE 40 MG PO CPDR: 40.0000 mg | DELAYED_RELEASE_CAPSULE | Freq: Every day | ORAL | 0 refills | Status: DC

## 2023-07-10 MED ORDER — SUFLAVE 178.7 G PO SOLR: 1.0000 | Freq: Once | ORAL | 0 refills | Status: AC

## 2023-07-10 NOTE — Patient Instructions (Addendum)
You have been scheduled for an endoscopy and colonoscopy. Please follow the written instructions given to you at your visit today.  Please pick up your prep supplies at the pharmacy within the next 1-3 days.  If you use inhalers (even only as needed), please bring them with you on the day of your procedure.  DO NOT TAKE 7 DAYS PRIOR TO TEST- Trulicity (dulaglutide) Ozempic, Wegovy (semaglutide) Mounjaro (tirzepatide) Bydureon Bcise (exanatide extended release)  DO NOT TAKE 1 DAY PRIOR TO YOUR TEST Rybelsus (semaglutide) Adlyxin (lixisenatide) Victoza (liraglutide) Byetta (exanatide) ___________________________________________________________________________  STAY OFF THE BC POWDERS.  We have sent the following medications to your pharmacy for you to pick up at your convenience: Omeprazole  _______________________________________________________  If your blood pressure at your visit was 140/90 or greater, please contact your primary care physician to follow up on this.  _______________________________________________________  If you are age 73 or older, your body mass index should be between 23-30. Your Body mass index is 21.35 kg/m. If this is out of the aforementioned range listed, please consider follow up with your Primary Care Provider.  If you are age 42 or younger, your body mass index should be between 19-25. Your Body mass index is 21.35 kg/m. If this is out of the aformentioned range listed, please consider follow up with your Primary Care Provider.   ________________________________________________________  The [facility] providers would like to encourage you to use [facility] to communicate with providers for non-urgent requests or questions.  Due to long hold times on the telephone, sending your provider a message by [facility] may be a faster and more efficient way to get a response.  Please allow 48 business hours for a response.  Please remember that this is for  non-urgent requests.  _______________________________________________________   I appreciate the opportunity to care for you. Stan Head, MD, [facility]

## 2023-07-10 NOTE — Progress Notes (Signed)
Nichole Manning 57 y.o. 22-Jul-1965 [id number]  Assessment & Plan:   Encounter Diagnoses  Name Primary?   Heartburn Yes   Odynophagia    Colon cancer screening    Chronic constipation    Chronic migraine without aura without status migrainosus, not intractable-with use of BC powders    Treat heartburn and odynophagia symptoms with omeprazole 40 mg daily .  Evaluate these with EGD question GERD question infectious etiology  Note that autoimmune workup as part of ILD evaluation negative  Screening for colon cancer with colonoscopy  Hold Ozempic at least 1 week prior to procedures.  Consider this could be related to her symptoms.  The risks and benefits as well as alternatives of endoscopic procedure(s) have been discussed and reviewed. All questions answered. The patient agrees to proceed.   Continue to avoid BC powder use for headaches  Continue Linzess for chronic constipation  Further plans pending the above  CC: Armando Gang, FNP   Subjective:  The patient consented to the use of artificial intelligence Health visitor Complaint: Burning in esophagus and nausea plus fatigue  HPI 58 yo woman w/ PMHx ILD/COPD, hypothyroidism here w/ esophageal burning, nausea and fatigue.  Burning chest pain has been present for the past month, specifically in the esophagus. The sensation is described as 'aggravating' and 'scraping', particularly when eating, and is intermittent. Eating intensifies the burning sensation. They experience a 'weird hiccup' but no dysphagia or regurgitation. They take a [employer] brand omeprazole two in the morning, which alleviates the burning, but did not take it today and are experiencing burning again.  She does not recall symptoms of this in the past.   Chronic constipation has been an issue since being diagnosed with a thyroid disorder. Linzess is taken daily for bowel regulation, resulting in frequent bowel movements on some  days, but occasionally it is ineffective. Prior treatments included MiraLAX and other over-the-counter laxatives without success. They have been on Ozempic for a few months for a heart issue, which may contribute to bowel issues. No urinary issues or painful intercourse are reported.  Chronic headaches have been a long-standing issue, with BC headache powders used for years, though not in the past three to four weeks. Previous consultations with a neurologist were ineffective. No recent antibiotic use, such as doxycycline, is reported.  She did have a Juvderm lip injection by plastic surgery 06/01/2023.  Last visit in this practice is 2010 with complaints of chronic constipation and chronic BC powder use noted then also.    Lab Results  Component Value Date   ALT 10 03/02/2023   AST 14 03/02/2023   ALKPHOS 69 03/02/2023   BILITOT 0.3 03/02/2023   Lab Results  Component Value Date   WBC 7.7 03/02/2023   HGB 13.6 03/02/2023   HCT 40.9 03/02/2023   MCV 97 03/02/2023   PLT 270 03/02/2023    CT chest 12/06/22 Narrative & Impression  CLINICAL DATA:  58 year old female with history of dyspnea on exertion. Evaluate for interstitial lung disease.   IMPRESSION: 1. The appearance of the lungs could suggest very early or mild interstitial lung disease, with a spectrum of findings considered indeterminate for usual interstitial pneumonia (UIP) per current ATS guidelines. Outpatient referral to Pulmonology for further clinical evaluation is recommended. Follow-up high-resolution chest CT should also be considered in 12 months to assess for temporal changes in the appearance of the lung parenchyma. 2. Aortic atherosclerosis.   Aortic Atherosclerosis (ICD10-I70.0).  Allergies  Allergen Reactions   Sulfa Antibiotics Hives   Adhesive [Tape] Itching    Rxn was from [employer].   Codeine Nausea And Vomiting   Current Meds  Medication Sig   acetaminophen (TYLENOL) 325 MG tablet Take  2 tablets (650 mg total) by mouth every 6 (six) hours as needed for mild pain, fever or headache.   ALTRENO 0.05 % LOTN Apply 0.05 % topically daily.   Calcium Citrate 250 MG TABS Take 1 tablet by mouth daily.   Clindamycin-Benzoyl Per, Refr, gel Apply 1 application topically daily.   cyanocobalamin (,VITAMIN B-12,) 1000 MCG/ML injection Inject 1,000 mcg into the muscle every 28 (twenty-eight) days.   estradiol (ESTRACE) 0.5 MG tablet Take 0.5 mg by mouth daily.   hydrOXYzine (VISTARIL) 25 MG capsule Take 25-50 mg by mouth at bedtime as needed.   levothyroxine (SYNTHROID) 88 MCG tablet Take 88 mcg by mouth every morning.   lidocaine (XYLOCAINE) 2 % solution Use as directed 15 mLs in the mouth or throat as needed for mouth pain.   linaclotide (LINZESS) 145 MCG CAPS capsule Take 145 mg by mouth daily.   minoxidil (LONITEN) 2.5 MG tablet Take 2.5 mg by mouth daily.   Multiple Vitamin (MULTIVITAMIN WITH MINERALS) TABS tablet Take 1 tablet by mouth daily.       OZEMPIC, 0.25 OR 0.5 MG/DOSE, 2 MG/3ML SOPN Inject 0.25 mg as directed once a week.   progesterone (PROMETRIUM) 200 MG capsule Take 200 mg by mouth at bedtime.   propranolol ER (INDERAL LA) 60 MG 24 hr capsule Take 1 capsule (60 mg total) by mouth daily.   rosuvastatin (CRESTOR) 5 MG tablet Take 1 tablet (5 mg total) by mouth daily.   SPIRIVA RESPIMAT 2.5 MCG/ACT AERS Inhale 2 puffs into the lungs daily at 6 (six) AM.   spironolactone (ALDACTONE) 25 MG tablet Take 0.5 tablets (12.5 mg total) by mouth daily. Take for one week, then stop taking.   SYMBICORT 160-4.5 MCG/ACT inhaler Inhale 2 puffs into the lungs as needed.   valACYclovir (VALTREX) 500 MG tablet Take 500 mg by mouth daily.   vitamin C (ASCORBIC ACID) 500 MG tablet Take 500 mg by mouth daily.   Past Medical History:  Diagnosis Date   Anxiety    COPD (chronic obstructive pulmonary disease) (HCC)    Fibroids    Hypothyroidism    ILD (interstitial lung disease) (HCC)    Mild  emphysema (HCC)    Palpitations    SVD (spontaneous vaginal delivery)    x 3   Past Surgical History:  Procedure Laterality Date   APPENDECTOMY     AUGMENTATION MAMMAPLASTY     BILATERAL SALPINGECTOMY Bilateral 07/01/2013   Procedure: BILATERAL SALPINGECTOMY;  Surgeon: Lenoard Aden, MD;  Location: [facility];  Service: Gynecology;  Laterality: Bilateral;   BREAST SURGERY     breast reduction x 2   LAPAROSCOPIC APPENDECTOMY N/A 09/09/2020   Procedure: APPENDECTOMY LAPAROSCOPIC;  Surgeon: Kinsinger, De Blanch, MD;  Location: [facility];  Service: General;  Laterality: N/A;   TUBAL LIGATION     WISDOM TOOTH EXTRACTION     Social History   Social History Narrative   Divorced 1 son and 1 daughter   2-story home   Left handed   1 caffeinated beverage daily   She has been a Librarian, 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 She was adopted.   Review of Systems Positive for: Fatigue, dyspnea back pain depressed mood.  Otherwise negative as per HPI  Objective:   Physical Exam: BP 92/68   Pulse 72   Ht 5\' 4"  (1.626 m)   Wt 124 lb 6 oz (56.4 kg)   BMI 21.35 kg/m  Constitutional: WDWN NAD Eyes: anicteric Mouth: oral and posterior pharynx free of lesions Neck: supple, no mass or thyromegaly Lungs: clear to auscultation bilaterally Cardiovascular: S1S2 with regular rate and rhythm, no rubs murmurs or gallops Abdomen: soft, nontender, nondistended, no masses or organomegaly, normal bowel sounds Rectal: deferred Neuro: alert and oriented x 3 Psych: normal mood and affect   Data Reviewed:  See HPI also  I reviewed pulmonary notes, cardiology notes from the last 6 months and a neurology note from June 2024.  She had a facial droop and stroke was ruled out but not thought to be Bell's palsy as it spares the forehead.  Chronic migraine  without aura and ocular migraine listed.

## 2023-07-16 ENCOUNTER — Ambulatory Visit: Payer: Medicaid Other | Admitting: Dermatology

## 2023-07-16 ENCOUNTER — Encounter: Payer: Self-pay | Admitting: Dermatology

## 2023-07-16 DIAGNOSIS — L578 Other skin changes due to chronic exposure to nonionizing radiation: Secondary | ICD-10-CM

## 2023-07-16 DIAGNOSIS — W908XXA Exposure to other nonionizing radiation, initial encounter: Secondary | ICD-10-CM | POA: Diagnosis not present

## 2023-07-16 DIAGNOSIS — L821 Other seborrheic keratosis: Secondary | ICD-10-CM | POA: Diagnosis not present

## 2023-07-16 DIAGNOSIS — L820 Inflamed seborrheic keratosis: Secondary | ICD-10-CM | POA: Diagnosis not present

## 2023-07-16 NOTE — Patient Instructions (Signed)

## 2023-07-16 NOTE — Progress Notes (Signed)
   Follow-Up Visit   Subjective  Nichole Manning is a 58 y.o. female who presents for the following: spot at right lower leg. Patient has had something similar (ISK) treated in the past with LN2. Present for about 1 month. No hx skin cancer.   The patient has spots, moles and lesions to be evaluated, some may be new or changing and the patient may have concern these could be cancer.   The following portions of the chart were reviewed this encounter and updated as appropriate: medications, allergies, medical history  Review of Systems:  No other skin or systemic complaints except as noted in HPI or Assessment and Plan.  Objective  Well appearing patient in no apparent distress; mood and affect are within normal limits.   A focused examination was performed of the following areas: Legs, arms   Relevant exam findings are noted in the Assessment and Plan.  R lower leg x 2, L lower leg x 1, R forearm x 2, L forearm x 1 (6) Erythematous stuck-on, waxy papule or plaque  Assessment & Plan     INFLAMED SEBORRHEIC KERATOSIS (6) R lower leg x 2, L lower leg x 1, R forearm x 2, L forearm x 1 (6) Symptomatic, irritating, patient would like treated.  Benign-appearing.  Call clinic for new or changing lesions.   Destruction of lesion - R lower leg x 2, L lower leg x 1, R forearm x 2, L forearm x 1 (6) Complexity: simple   Destruction method: cryotherapy   Informed consent: discussed and consent obtained   Timeout:  patient name, date of birth, surgical site, and procedure verified Lesion destroyed using liquid nitrogen: Yes   Region frozen until ice ball extended beyond lesion: Yes   Cryo cycles: 1 or 2. Outcome: patient tolerated procedure well with no complications   Post-procedure details: wound care instructions given   SEBORRHEIC KERATOSES    SEBORRHEIC KERATOSIS - Stuck-on, waxy, tan-brown papules and/or plaques  - Benign-appearing - Discussed benign etiology and  prognosis. - Observe - Call for any changes  ACTINIC DAMAGE - chronic, secondary to cumulative UV radiation exposure/sun exposure over time - diffuse scaly erythematous macules with underlying dyspigmentation - Recommend daily broad spectrum sunscreen SPF 30+ to sun-exposed areas, reapply every 2 hours as needed.  - Recommend staying in the shade or wearing long sleeves, sun glasses (UVA+UVB protection) and wide brim hats (4-inch brim around the entire circumference of the hat). - Call for new or changing lesions.  Return if symptoms worsen or fail to improve.  LILLETTE Lonell Drones, RMA, am acting as scribe for Boneta Sharps, MD .   Documentation: I have reviewed the above documentation for accuracy and completeness, and I agree with the above.  Boneta Sharps, MD

## 2023-07-29 ENCOUNTER — Telehealth (INDEPENDENT_AMBULATORY_CARE_PROVIDER_SITE_OTHER): Payer: Medicaid Other | Admitting: Pulmonary Disease

## 2023-07-29 DIAGNOSIS — J849 Interstitial pulmonary disease, unspecified: Secondary | ICD-10-CM | POA: Diagnosis not present

## 2023-07-29 NOTE — Progress Notes (Signed)
 Nichole Manning    [id number]    08-Dec-1965  Primary Care Physician:Lindley, Fernand Parkins, FNP  Referring Physician: Armando Gang, FNP [address] [place],  [place] [address]  Virtual Visit via Video Note  I connected with Nichole Manning on 08/08/23 at  2:15 PM EST by a video enabled telemedicine application and verified that I am speaking with the correct person using two identifiers.  Location: Patient: Home Provider: Office  Chief complaint:  Follow-up for interstitial lung disease  HPI: 58 y.o. who  has a past medical history of Anxiety, COPD (chronic obstructive pulmonary disease) (HCC), Fibroids, Hypothyroidism, ILD (interstitial lung disease) (HCC), Mild emphysema (HCC), Palpitations, and SVD (spontaneous vaginal delivery).   Referred for evaluation of COPD and dyspnea with abnormal CT scan. She had history of COVID infection in June 2020 and was evaluated in the pulmonary office with cough, shortness of breath, brain fog and memory loss.  Had a CT at that time showed subtle basal changes.  PFTs were ordered as follow-up but she did not go through with the test  Now referred back with increasing dyspnea on exertion.  She has occasional cough.  No mucus production or wheezing.  She had a follow-up high-res CT recently which showed increased groundglass opacities at the base.  Pets: Dog Occupation: Retired.  Used to work in legal Exposures: She has a feather pillow on her living room so far.  No mold, hot tub, Jacuzzi. Smoking history: 45-pack-year smoker.  Quit in 2021 and started vaping Travel history: No significant travel history Relevant family history: No family history of lung disease  Interim History Discussed the use of AI scribe software for clinical note transcription with the patient, who gave verbal consent to proceed.  Nichole Manning is a 58 year old female with interstitial lung disease who presents with difficulty breathing and concerns  about lung changes.  She experiences a burning sensation and difficulty breathing, describing it as an inability to get a full breath. She is uncertain whether the sensation originates from her esophagus or lungs and compares it to bronchitis, which she frequently experienced when she was a cigarette smoker.  She is concerned about interstitial lung disease, having read online about a specific type with a poor prognosis. Her lung scan results are indeterminate, showing changes that could be due to inflammation and scarring. She has no known exposure to asbestos, metals, or Surveyor, quantity, but has a history of vaping, which she started to quit smoking cigarettes. Her symptoms began around the time she started vaping.  No current exposure to down pillows or comforters, which she previously used. She has been tested for autoimmune diseases like lupus and rheumatoid arthritis, with negative results. She has a history of hyperthyroidism, but it is noted that this condition does not typically cause interstitial lung disease.  She feels fatigued and lacks energy, and is concerned about the progression of her symptoms. She is currently not on any medication for interstitial lung disease and is not using supplemental oxygen.   Outpatient Encounter Medications as of 07/29/2023  Medication Sig   acetaminophen (TYLENOL) 325 MG tablet Take 2 tablets (650 mg total) by mouth every 6 (six) hours as needed for mild pain, fever or headache.   ALTRENO 0.05 % LOTN Apply 0.05 % topically daily.   Calcium Citrate 250 MG TABS Take 1 tablet by mouth daily.   Clindamycin-Benzoyl Per, Refr, gel Apply 1 application topically daily.  cyanocobalamin (,VITAMIN B-12,) 1000 MCG/ML injection Inject 1,000 mcg into the muscle every 28 (twenty-eight) days.   estradiol (ESTRACE) 0.5 MG tablet Take 0.5 mg by mouth daily.   hydrOXYzine (VISTARIL) 25 MG capsule Take 25-50 mg by mouth at bedtime as needed.   levothyroxine  (SYNTHROID) 88 MCG tablet Take 88 mcg by mouth every morning.   lidocaine (XYLOCAINE) 2 % solution Use as directed 15 mLs in the mouth or throat as needed for mouth pain.   linaclotide (LINZESS) 145 MCG CAPS capsule Take 145 mg by mouth daily.   minoxidil (LONITEN) 2.5 MG tablet Take 2.5 mg by mouth daily.   Multiple Vitamin (MULTIVITAMIN WITH MINERALS) TABS tablet Take 1 tablet by mouth daily.   omeprazole (PRILOSEC) 40 MG capsule Take 1 capsule (40 mg total) by mouth daily before breakfast. 30 mins before   OZEMPIC, 0.25 OR 0.5 MG/DOSE, 2 MG/3ML SOPN Inject 0.25 mg as directed once a week.   progesterone (PROMETRIUM) 200 MG capsule Take 200 mg by mouth at bedtime.   propranolol ER (INDERAL LA) 60 MG 24 hr capsule Take 1 capsule (60 mg total) by mouth daily.   rosuvastatin (CRESTOR) 5 MG tablet Take 1 tablet (5 mg total) by mouth daily.   SPIRIVA RESPIMAT 2.5 MCG/ACT AERS Inhale 2 puffs into the lungs daily at 6 (six) AM.   spironolactone (ALDACTONE) 25 MG tablet Take 0.5 tablets (12.5 mg total) by mouth daily. Take for one week, then stop taking.   SYMBICORT 160-4.5 MCG/ACT inhaler Inhale 2 puffs into the lungs as needed.   valACYclovir (VALTREX) 500 MG tablet Take 500 mg by mouth daily.   vitamin C (ASCORBIC ACID) 500 MG tablet Take 500 mg by mouth daily.   No facility-administered encounter medications on file as of 07/29/2023.   Physical Exam: Tele  Data Reviewed: Imaging: High-res CT 12/09/2019-subtle changes in the lung base bilaterally, mild diffuse bronchial wall thickening with mild emphysema High-res CT 12/02/2022-peripheral predominant groundglass attenuation and mild septal thickening.  Indeterminate pattern. I have reviewed the images personally.  PFTs: 04/16/2023 FVC 2.70 [77%], FEV1 2.14 [78%], F/F79, TLC 4.53 [87%], DLCO 11.70 [55%] Moderate diffusion impairment  Labs: CBC 10/10/2022-WBC 6.3, eos 1%, absolute eosinophil count 63 CTD serologies 01/28/2023-, CK, Jo 1, RNP,  hypersensitive panel, Sjogren's antibody, aldolase-negative  Echocardiogram Cardiac 07/18/2022-LVEF 60-65%, normal RV systolic size and function.  Trivial TR.  Assessment:  Interstitial Lung Disease (ILD) Progressive changes on CT scan from 2021 to 2024 with worsening shortness of breath and fatigue. No clear etiology identified. Autoimmune workup negative. No significant acid reflux. Vaping and prior COVID-19 infection considered as potential contributing factors.  She has minimal exposure to down pillows but she has gotten rid of it since prior visit.  CT scan shows indeterminate changes suggestive of ILD. Differential diagnosis includes exposure-related ILD, autoimmune-related ILD (ruled out by negative labs), and idiopathic pulmonary fibrosis (IPF), which is less likely. Discussed ILD prognosis, particularly IPF with a 3-5 year survival rate. Reassured that current condition does not appear to be IPF. Discussed lung biopsy as a diagnostic option, including risks (surgical procedure, anesthesia, incision on side, hospital stay), benefits (definitive diagnosis), and alternatives (monitoring progression with CT scans).  - Order repeat chest CT scan - Discuss case with multidisciplinary team - Consider lung biopsy if CT scan shows progression - Advise to quit vaping - Schedule follow-up visit in 1-2 months for in-person evaluation and potential oxygen requirement assessment  Emphysema Mild emphysema noted on CT scan.  No evidence of obstruction on PFTs. Currently on Spiriva, with history of using Albuterol and Symbicort. Reports minimal benefit. -Consider switching to Symbicort if inhaler therapy is to be continued.   Hyperthyroidism Hyperthyroidism does not typically cause interstitial lung disease.  Follow-up - Schedule follow-up visit in 1-2 months - Ensure she receives a call to set up the CT scan within a week; if not, she should call the office.   Plan/Recommendations: Discuss at  multidisciplinary conference CT scan  I discussed the assessment and treatment plan with the patient. The patient was provided an opportunity to ask questions and all were answered. The patient agreed with the plan and demonstrated an understanding of the instructions.   The patient was advised to call back or seek an in-person evaluation if the symptoms worsen or if the condition fails to improve as anticipated.  Chilton Greathouse MD [facility] 07/29/2023, 2:29 PM  CC: Armando Gang, FNP

## 2023-08-07 ENCOUNTER — Other Ambulatory Visit: Payer: Self-pay

## 2023-08-07 ENCOUNTER — Ambulatory Visit
Admission: RE | Admit: 2023-08-07 | Discharge: 2023-08-07 | Disposition: A | Discharge status: Home / Self Care | Payer: Medicaid Other | Source: Ambulatory Visit | Attending: Pulmonary Disease | Admitting: Pulmonary Disease

## 2023-08-07 ENCOUNTER — Ambulatory Visit
Admission: RE | Admit: 2023-08-07 | Discharge: 2023-08-07 | Disposition: A | Discharge status: Home / Self Care | Payer: Medicaid Other | Source: Ambulatory Visit | Attending: Emergency Medicine | Admitting: Emergency Medicine

## 2023-08-07 VITALS — BP 104/71 | HR 75 | Temp 98.0°F | Resp 18

## 2023-08-07 DIAGNOSIS — J069 Acute upper respiratory infection, unspecified: Secondary | ICD-10-CM | POA: Diagnosis not present

## 2023-08-07 DIAGNOSIS — J849 Interstitial pulmonary disease, unspecified: Secondary | ICD-10-CM

## 2023-08-07 LAB — POC COVID19/FLU A&B COMBO
Covid Antigen, POC: NEGATIVE
Influenza A Antigen, POC: NEGATIVE
Influenza B Antigen, POC: NEGATIVE

## 2023-08-07 LAB — POCT RAPID STREP A (OFFICE): Rapid Strep A Screen: NEGATIVE

## 2023-08-07 MED ORDER — DEXAMETHASONE SODIUM PHOSPHATE 10 MG/ML IJ SOLN
10.0000 mg | Freq: Once | INTRAMUSCULAR | Status: AC
  Administered 2023-08-07: 10 mg via INTRAMUSCULAR

## 2023-08-07 MED ORDER — AZITHROMYCIN 250 MG PO TABS: 250.0000 mg | ORAL_TABLET | Freq: Every day | ORAL | 0 refills | Status: DC

## 2023-08-07 MED ORDER — PREDNISONE 10 MG (21) PO TBPK: ORAL_TABLET | Freq: Every day | ORAL | 0 refills | Status: DC

## 2023-08-07 NOTE — Discharge Instructions (Signed)
 Your symptoms today are most likely being caused by a virus and should steadily improve in time it can take up to 7 to 10 days before you truly start to see a turnaround however things will get better  Covid and flu test negative   You have been placed on antibiotic prophylactically to ideally keep symptoms from worsening   To help with headache sinus pressure you may given an injection of Decadron which helps reduce internal inflammation ideally will see improvement within the hour  Starting tomorrow take prednisone tablets    You can take Tylenol as needed for fever reduction and pain relief.   For cough: honey 1/2 to 1 teaspoon (you can dilute the honey in water or another fluid).  You can also use guaifenesin and dextromethorphan for cough. You can use a humidifier for chest congestion and cough.  If you don't have a humidifier, you can sit in the bathroom with the hot shower running.      For sore throat: try warm salt water gargles, cepacol lozenges, throat spray, warm tea or water with lemon/honey, popsicles or ice, or OTC cold relief medicine for throat discomfort.   For congestion: take a daily anti-histamine like Zyrtec, Claritin, and a oral decongestant, such as pseudoephedrine.  You can also use Flonase 1-2 sprays in each nostril daily.   It is important to stay hydrated: drink plenty of fluids (water, gatorade/powerade/pedialyte, juices, or teas) to keep your throat moisturized and help further relieve irritation/discomfort.

## 2023-08-07 NOTE — ED Triage Notes (Signed)
 Patient presents to [facility] for evaluation of severe headache, sinus pressure and pain, ear pain, chest congestion.  Denies fever.

## 2023-08-07 NOTE — ED Provider Notes (Signed)
 Renaldo Fiddler    CSN: [id number] Arrival date & time: 08/07/23  1419      History   Chief Complaint Chief Complaint  Patient presents with   Sore Throat    earache headache chest pain - Entered by patient   URI    HPI MICKAELA STARLIN is a 58 y.o. female.   Patient presents for evaluation of bilateral aural fullness, nasal congestion, severe sinus pressure affecting the teeth, shortness of breath at rest, wheezing nonproductive cough and centralized chest discomfort beginning 1 day ago.  No shaded constant generalized headache.  Poor appetite but able to tolerate some food and liquids.  Denies presence of fever.  Has attempted use of Sudafed, Mucinex, Tylenol and BC powder.  History of interstitial lung disease, COPD.   Past Medical History:  Diagnosis Date   Anxiety    COPD (chronic obstructive pulmonary disease) (HCC)    Fibroids    Hypothyroidism    ILD (interstitial lung disease) (HCC)    Mild emphysema (HCC)    Palpitations    SVD (spontaneous vaginal delivery)    x 3    Patient Active Problem List   Diagnosis Date Noted   Appendicitis 09/09/2020   Acute appendicitis 09/09/2020   Tobacco use 11/16/2019   Chronic fatigue 10/10/2019   Chronic cough 10/10/2019   Anxiety and depression 10/10/2019   History of COVID-19 10/10/2019   Shortness of breath 10/10/2019   Brain fog 10/10/2019   Memory loss 10/10/2019   Heart palpitations 10/10/2019   Fibroids 07/01/2013   Hypothyroidism 07/25/2008   Constipation 07/25/2008   Headache 07/25/2008    Past Surgical History:  Procedure Laterality Date   APPENDECTOMY     AUGMENTATION MAMMAPLASTY     BILATERAL SALPINGECTOMY Bilateral 07/01/2013   Procedure: BILATERAL SALPINGECTOMY;  Surgeon: Lenoard Aden, MD;  Location: [facility];  Service: Gynecology;  Laterality: Bilateral;   BREAST SURGERY     breast reduction x 2   LAPAROSCOPIC APPENDECTOMY N/A 09/09/2020   Procedure: APPENDECTOMY LAPAROSCOPIC;  Surgeon:  Kinsinger, De Blanch, MD;  Location: [facility];  Service: General;  Laterality: N/A;   TUBAL LIGATION     WISDOM TOOTH EXTRACTION      OB History   No obstetric history on file.      Home Medications    Prior to Admission medications   Medication Sig Start Date End Date Taking? Authorizing Provider  azithromycin (ZITHROMAX) 250 MG tablet Take 1 tablet (250 mg total) by mouth daily. Take first 2 tablets together, then 1 every day until finished. 08/07/23  Yes Sebastiana Wuest R, NP  predniSONE (STERAPRED UNI-PAK 21 TAB) 10 MG (21) TBPK tablet Take by mouth daily. Take 6 tabs by mouth daily  for 1 days, then 5 tabs for 1 days, then 4 tabs for 1 days, then 3 tabs for 1 days, 2 tabs for 1 days, then 1 tab by mouth daily for 1 days 08/07/23  Yes Annais Crafts R, NP  acetaminophen (TYLENOL) 325 MG tablet Take 2 tablets (650 mg total) by mouth every 6 (six) hours as needed for mild pain, fever or headache. 09/10/20   Trixie Deis R, PA-C  ALTRENO 0.05 % LOTN Apply 0.05 % topically daily. 05/23/22   [provider]  Calcium Citrate 250 MG TABS Take 1 tablet by mouth daily. 07/13/20   [provider]  Clindamycin-Benzoyl Per, Refr, gel Apply 1 application topically daily. 08/27/20   [provider]  cyanocobalamin (,VITAMIN B-12,)  1000 MCG/ML injection Inject 1,000 mcg into the muscle every 28 (twenty-eight) days. 08/08/20   [provider]  estradiol (ESTRACE) 0.5 MG tablet Take 0.5 mg by mouth daily. 10/23/22   [provider]  hydrOXYzine (VISTARIL) 25 MG capsule Take 25-50 mg by mouth at bedtime as needed. 10/23/22   [provider]  levothyroxine (SYNTHROID) 88 MCG tablet Take 88 mcg by mouth every morning. 08/11/20   [provider]  lidocaine (XYLOCAINE) 2 % solution Use as directed 15 mLs in the mouth or throat as needed for mouth pain. 04/08/22   Mickie Bail, NP  linaclotide Karlene Einstein) 145 MCG CAPS capsule Take 145 mg by mouth daily.     [provider]  minoxidil (LONITEN) 2.5 MG tablet Take 2.5 mg by mouth daily. 08/28/22   [provider]  Multiple Vitamin (MULTIVITAMIN WITH MINERALS) TABS tablet Take 1 tablet by mouth daily.    [provider]  omeprazole (PRILOSEC) 40 MG capsule Take 1 capsule (40 mg total) by mouth daily before breakfast. 30 mins before 07/10/23   Iva Boop, MD  OZEMPIC, 0.25 OR 0.5 MG/DOSE, 2 MG/3ML SOPN Inject 0.25 mg as directed once a week. 05/12/23   [provider]  progesterone (PROMETRIUM) 200 MG capsule Take 200 mg by mouth at bedtime. 10/23/22   [provider]  propranolol ER (INDERAL LA) 60 MG 24 hr capsule Take 1 capsule (60 mg total) by mouth daily. 05/11/23   Jodelle Red, MD  rosuvastatin (CRESTOR) 5 MG tablet Take 1 tablet (5 mg total) by mouth daily. 05/25/23 08/23/23  Sharlene Dory, PA-C  SPIRIVA RESPIMAT 2.5 MCG/ACT AERS Inhale 2 puffs into the lungs daily at 6 (six) AM. 05/14/23   [provider]  spironolactone (ALDACTONE) 25 MG tablet Take 0.5 tablets (12.5 mg total) by mouth daily. Take for one week, then stop taking. 03/02/23   Sharlene Dory, PA-C  SYMBICORT 160-4.5 MCG/ACT inhaler Inhale 2 puffs into the lungs as needed. 09/23/22   [provider]  valACYclovir (VALTREX) 500 MG tablet Take 500 mg by mouth daily. 04/29/23   [provider]  vitamin C (ASCORBIC ACID) 500 MG tablet Take 500 mg by mouth daily.    [provider]    Family History Family History  Adopted: Yes  Problem Relation Age of Onset   Breast cancer Mother 83   Cancer Father        bile duct cancer died in his 58's    Social History Social History   Tobacco Use   Smoking status: Former    Current packs/day: 1.50    Average packs/day: 1.5 packs/day for 30.0 years (45.0 ttl pk-yrs)    Types: Cigarettes   Smokeless tobacco: Never   Tobacco comments:    20 cigarettes per day 11/16/19 [facility]   Vaping Use   Vaping  status: Some Days  Substance Use Topics   Alcohol use: Yes    Comment: social   Drug use: No     Allergies   Sulfa antibiotics, Adhesive [tape], and Codeine   Review of Systems Review of Systems   Physical Exam Triage Vital Signs ED Triage Vitals  Encounter Vitals Group     BP 08/07/23 1427 104/71     Systolic BP Percentile --      Diastolic BP Percentile --      Pulse Rate 08/07/23 1427 75     Resp 08/07/23 1427 18     Temp 08/07/23  1427 98 F (36.7 C)     Temp Source 08/07/23 1427 Oral     SpO2 08/07/23 1427 98 %     Weight --      Height --      Head Circumference --      Peak Flow --      Pain Score 08/07/23 1426 9     Pain Loc --      Pain Education --      Exclude from Growth Chart --    No data found.  Updated Vital Signs BP 104/71 (BP Location: Left Arm)   Pulse 75   Temp 98 F (36.7 C) (Oral)   Resp 18   SpO2 98%   Visual Acuity Right Eye Distance:   Left Eye Distance:   Bilateral Distance:    Right Eye Near:   Left Eye Near:    Bilateral Near:     Physical Exam Constitutional:      Appearance: Normal appearance.  HENT:     Head: Normocephalic.     Right Ear: Ear canal and external ear normal. A middle ear effusion is present.     Left Ear: Ear canal and external ear normal. A middle ear effusion is present.     Nose: Congestion present. No rhinorrhea.     Right Sinus: Maxillary sinus tenderness and frontal sinus tenderness present.     Left Sinus: Maxillary sinus tenderness and frontal sinus tenderness present.     Mouth/Throat:     Pharynx: No oropharyngeal exudate or posterior oropharyngeal erythema.  Eyes:     Extraocular Movements: Extraocular movements intact.  Cardiovascular:     Rate and Rhythm: Normal rate and regular rhythm.     Pulses: Normal pulses.     Heart sounds: Normal heart sounds.  Pulmonary:     Effort: Pulmonary effort is normal.     Breath sounds: Normal breath sounds.  Neurological:     Mental Status: She  is alert and oriented to person, place, and time. Mental status is at baseline.      UC Treatments / Results  Labs (all labs ordered are listed, but only abnormal results are displayed) Labs Reviewed  POC COVID19/FLU A&B COMBO - Normal  POCT RAPID STREP A (OFFICE) - Normal    EKG   Radiology No results found.  Procedures Procedures (including critical care time)  Medications Ordered in UC Medications  dexamethasone (DECADRON) injection 10 mg (10 mg Intramuscular Given 08/07/23 1500)    Initial Impression / Assessment and Plan / UC Course  I have reviewed the triage vital signs and the nursing notes.  Pertinent labs & imaging results that were available during my care of the patient were reviewed by me and considered in my medical decision making (see chart for details).  Acute URI  Vital signs are stable, O2 saturation 98% on room, lungs clear to auscultation, stable for outpatient management, deferring imaging at this time as symptoms began 1 day ago, low suspicion for pneumonia or bronchitis, will provide bacterial coverage to prevent symptoms from ideally worsening, azithromycin sent to pharmacy see, for management of shortness of breath, wheezing and sinus pressure Decadron IM given and prescribed oral prednisone, recommended additional medications for supportive care with follow-up with urgent care as needed Final Clinical Impressions(s) / UC Diagnoses   Final diagnoses:  Acute URI     Discharge Instructions      Your symptoms today are most likely being caused by a virus and  should steadily improve in time it can take up to 7 to 10 days before you truly start to see a turnaround however things will get better  Covid and flu test negative   You have been placed on antibiotic prophylactically to ideally keep symptoms from worsening   To help with headache sinus pressure you may given an injection of Decadron which helps reduce internal inflammation ideally will  see improvement within the hour  Starting tomorrow take prednisone tablets    You can take Tylenol as needed for fever reduction and pain relief.   For cough: honey 1/2 to 1 teaspoon (you can dilute the honey in water or another fluid).  You can also use guaifenesin and dextromethorphan for cough. You can use a humidifier for chest congestion and cough.  If you don't have a humidifier, you can sit in the bathroom with the hot shower running.      For sore throat: try warm salt water gargles, cepacol lozenges, throat spray, warm tea or water with lemon/honey, popsicles or ice, or OTC cold relief medicine for throat discomfort.   For congestion: take a daily anti-histamine like Zyrtec, Claritin, and a oral decongestant, such as pseudoephedrine.  You can also use Flonase 1-2 sprays in each nostril daily.   It is important to stay hydrated: drink plenty of fluids (water, gatorade/powerade/pedialyte, juices, or teas) to keep your throat moisturized and help further relieve irritation/discomfort.    ED Prescriptions     Medication Sig Dispense Auth. Provider   predniSONE (STERAPRED UNI-PAK 21 TAB) 10 MG (21) TBPK tablet Take by mouth daily. Take 6 tabs by mouth daily  for 1 days, then 5 tabs for 1 days, then 4 tabs for 1 days, then 3 tabs for 1 days, 2 tabs for 1 days, then 1 tab by mouth daily for 1 days 21 tablet Dazaria Macneill R, NP   azithromycin (ZITHROMAX) 250 MG tablet Take 1 tablet (250 mg total) by mouth daily. Take first 2 tablets together, then 1 every day until finished. 6 tablet Valinda Hoar, NP      PDMP not reviewed this encounter.   Valinda Hoar, [place] 08/07/23 [phone]

## 2023-08-08 NOTE — Patient Instructions (Signed)
 VISIT SUMMARY:  Nichole Manning, a 58 year old female with interstitial lung disease, visited today due to difficulty breathing and concerns about lung changes. She experiences a burning sensation and difficulty breathing, which she compares to bronchitis. Her lung scan results are indeterminate, showing changes that could be due to inflammation and scarring. She has a history of vaping and previously used down pillows. She has been tested for autoimmune diseases with negative results and has a history of hyperthyroidism. She feels fatigued and lacks energy.  YOUR PLAN:  -INTERSTITIAL LUNG DISEASE (ILD): Interstitial lung disease refers to a group of disorders that cause scarring and inflammation of the lungs. Your symptoms include a burning chest sensation and difficulty breathing. We discussed the possibility of a lung biopsy to get a definitive diagnosis, but for now, we will monitor your condition with a repeat chest CT scan. It is important that you quit vaping as it may be contributing to your symptoms. We will discuss your case with a multidisciplinary team and consider further steps based on the CT scan results.  -HYPERTHYROIDISM: Hyperthyroidism is a condition where the thyroid gland produces too much thyroid hormone. It does not typically cause interstitial lung disease, so it is not likely related to your current lung issues.  INSTRUCTIONS:  Please schedule a follow-up visit in 1-2 months. You will receive a call to set up the CT scan within a week; if you do not, please call our office. It is important to quit vaping to help manage your symptoms.

## 2023-08-17 ENCOUNTER — Encounter: Payer: Medicaid Other | Admitting: Internal Medicine

## 2023-08-19 ENCOUNTER — Encounter: Payer: Self-pay | Admitting: Pulmonary Disease

## 2023-08-20 NOTE — Telephone Encounter (Signed)
 I called and discussed with patient Final CT read is pending but by my review the information appears to have improved We can hold off on lung biopsy for now She is in the process of getting her attic cleaned out of bat droppings  Please make a follow-up appointment with me in the next 3 months.

## 2023-08-21 NOTE — Telephone Encounter (Signed)
 Patient has appt 12/01/23

## 2023-08-22 LAB — LIPID PANEL
Chol/HDL Ratio: 2.2 ratio (ref 0.0–4.4)
Cholesterol, Total: 161 mg/dL (ref 100–199)
HDL: 72 mg/dL (ref >39)
LDL Chol Calc (NIH): 70 mg/dL (ref 0–99)
Triglycerides: 108 mg/dL (ref 0–149)
VLDL Cholesterol Cal: 19 mg/dL (ref 5–40)

## 2023-08-22 LAB — HEPATIC FUNCTION PANEL
ALT: 17 IU/L (ref 0–32)
AST: 22 IU/L (ref 0–40)
Albumin: 4.4 g/dL (ref 3.8–4.9)
Alkaline Phosphatase: 73 IU/L (ref 44–121)
Bilirubin Total: 0.4 mg/dL (ref 0.0–1.2)
Bilirubin, Direct: 0.17 mg/dL (ref 0.00–0.40)
Total Protein: 6.7 g/dL (ref 6.0–8.5)

## 2023-08-22 NOTE — Progress Notes (Unsigned)
 Cardiology Office Note    Date:  08/24/2023  ID:  Nichole Manning, DOB 22-Jul-1965, MRN [id number] PCP:  Armando Gang, FNP  Cardiologist:  Jodelle Red, MD  Electrophysiologist:  None   Chief Complaint: f/u fatigue, PSVT/PACs/PVCs  History of Present Illness: .    Nichole Manning is a 58 y.o. 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    She was previously seen for palpitations. Monitor 01/2020 55-144bpm, avg 79bpm, NSR, one 5 beat SVT, rare <1% PACs, PVCs. Lipids are managed by primary care. She was seen in clinic in 06/2022 for pre-op evaluation for elective blepharoplasty and was found to have abnormal EKG and exertional dyspnea. EKG obtained from PCP visit showed NSR with frequent PVCs, nonspecific STTW changes otherwise in precordial leads though possibly due to lead placement. EKG here showed NSR with no significant STTW changes anteriorly and no further ectopy. 3 day Zio 07/2022 showed 2.2% PVCs, <1% PACs, 3 short runs SVT (longest 13 beats). Echo 07/2022 showed EF 60-65%, trivial MR. Coronary CTA was unable to be performed due to her PVCs, so ETT was performed instead which was read as negative/low risk, achieved 9.2 METS. She did not wish to add scheduled BB at that time. She has been on spironolactone and minoxidil for non-cardiac cosmetic reasons. Spironolactone was later stopped due to soft blood pressure. Sleep study due to fatigue was negative. She has also had issues with hyperhidrosis. When I met her we trialed long-acting propranolol and also arranged f/u high res CT scan given prior abnormalities seen. This showed possible ILD so she subsequently saw pulm who is following this. Dr. Isaiah Serge recently repeated a scan and his  preliminary review showed some improvement. She was also advised to quit vaping.   She returns for follow-up without intensification of cardiac symptoms. Palpitations are largely controlled and unbothersome. If she does notice them, they're at night. She walks her dog without dyspnea or chest pain. No syncope. She continues to struggle with generalized fatigue and difficulty with insomnia. She is also dealing with bat droppings in her attic. She had mentioned this to Dr. Isaiah Serge as well.   Labwork independently reviewed: 08/2023 LDL 70, trig 108, LFTs ok 07/4399 CBC, CMET OK, TSH 0.194 with normal total T4, no free T4 or total T3 drawn  ROS: .    Please see the history of present illness.  All other systems are reviewed and otherwise negative.  Studies Reviewed: Marland Kitchen    EKG:  EKG is ordered today, personally reviewed, demonstrating NSR 72bpm, one PVC, nonspecific TW flattening   CV Studies: Cardiac studies reviewed are outlined and summarized above. Otherwise please see EMR for full report.   Current Reported Medications:.    Current Meds  Medication Sig   acetaminophen (TYLENOL) 325 MG tablet Take 2 tablets (650 mg total) by mouth every 6 (six) hours as needed for mild pain, fever or headache.   Calcium Citrate 250 MG TABS Take 1 tablet by mouth daily.   Clindamycin-Benzoyl Per, Refr, gel Apply 1 application topically daily.   cyanocobalamin (,VITAMIN B-12,) 1000 MCG/ML injection Inject 1,000 mcg into the muscle every 28 (twenty-eight) days.   estradiol (ESTRACE) 0.5 MG tablet Take 0.5 mg by mouth daily.   hydrOXYzine (VISTARIL) 25 MG capsule Take 25-50  mg by mouth at bedtime as needed.   levothyroxine (SYNTHROID) 88 MCG tablet Take 88 mcg by mouth every morning.   lidocaine (XYLOCAINE) 2 % solution Use as directed 15 mLs in the mouth or throat as needed for mouth pain.   linaclotide (LINZESS) 145 MCG CAPS capsule Take 145 mg by mouth daily.   minoxidil (LONITEN) 2.5 MG tablet Take 2.5  mg by mouth daily.   Multiple Vitamin (MULTIVITAMIN WITH MINERALS) TABS tablet Take 1 tablet by mouth daily.   omeprazole (PRILOSEC) 40 MG capsule Take 1 capsule (40 mg total) by mouth daily before breakfast. 30 mins before   OZEMPIC, 0.25 OR 0.5 MG/DOSE, 2 MG/3ML SOPN Inject 0.25 mg as directed once a week.   progesterone (PROMETRIUM) 200 MG capsule Take 200 mg by mouth at bedtime.   propranolol ER (INDERAL LA) 60 MG 24 hr capsule Take 1 capsule (60 mg total) by mouth daily.   rosuvastatin (CRESTOR) 5 MG tablet Take 1 tablet (5 mg total) by mouth daily.   SPIRIVA RESPIMAT 2.5 MCG/ACT AERS Inhale 2 puffs into the lungs daily at 6 (six) AM.   SUFLAVE 178.7 g SOLR Take by mouth as directed. colonoscopy   SYMBICORT 160-4.5 MCG/ACT inhaler Inhale 2 puffs into the lungs as needed.   vitamin C (ASCORBIC ACID) 500 MG tablet Take 500 mg by mouth daily.    Physical Exam:    VS:  BP 108/68 (BP Location: Left Arm)   Pulse 72   Ht 5\' 4"  (1.626 m)   Wt 123 lb 9.6 oz (56.1 kg)   SpO2 97%   BMI 21.22 kg/m    Wt Readings from Last 3 Encounters:  08/24/23 123 lb 9.6 oz (56.1 kg)  07/10/23 124 lb 6 oz (56.4 kg)  05/19/23 126 lb 12.8 oz (57.5 kg)    GEN: Well nourished, well developed in no acute distress NECK: No JVD; No carotid bruits CARDIAC: RRR, no murmurs, rubs, gallops RESPIRATORY:  Clear to auscultation without rales, wheezing or rhonchi  ABDOMEN: Soft, non-tender, non-distended EXTREMITIES:  No edema; No acute deformity   Asessement and Plan:.    1. PSVT, PVCs, PACs - generally controlled on propranolol; continue present regimen. Given abnormal TSH in 02/2023, will recheck with free T4 today in case this is contributing to her insomnia.  2. Fatigue - cardiac testing generally reassuring without concomitant anginal symptoms. She inquired about whether testing could be done given her exposure to bat droppings. I shared this is generally out of the scope of cardiology care and best served in  discussion with pulm/PCP.  3. Trivial MR - no intervention needed at this time, follow clinically.  4. Aortic atherosclerosis - started on rosuvastatin with excellent clinical response. LDL 70 by recheck. Continue present regimen.    Disposition: F/u with 6 months per pt request; she prefers [facility] location so will arrange with me or Jari Favre PA-C.  Signed, Laurann Montana, PA-C

## 2023-08-24 ENCOUNTER — Ambulatory Visit: Payer: Medicaid Other | Admitting: Physician Assistant

## 2023-08-24 ENCOUNTER — Encounter: Payer: Self-pay | Admitting: Physician Assistant

## 2023-08-24 ENCOUNTER — Ambulatory Visit: Payer: Medicaid Other | Attending: Physician Assistant | Admitting: Physician Assistant

## 2023-08-24 VITALS — BP 108/68 | HR 72 | Ht 64.0 in | Wt 123.6 lb

## 2023-08-24 DIAGNOSIS — I491 Atrial premature depolarization: Secondary | ICD-10-CM | POA: Diagnosis not present

## 2023-08-24 DIAGNOSIS — I471 Supraventricular tachycardia, unspecified: Secondary | ICD-10-CM | POA: Diagnosis not present

## 2023-08-24 DIAGNOSIS — I493 Ventricular premature depolarization: Secondary | ICD-10-CM

## 2023-08-24 DIAGNOSIS — I340 Nonrheumatic mitral (valve) insufficiency: Secondary | ICD-10-CM

## 2023-08-24 DIAGNOSIS — I700 Atherosclerosis of aorta: Secondary | ICD-10-CM

## 2023-08-24 NOTE — Patient Instructions (Signed)
 Medication Instructions:  Your physician recommends that you continue on your current medications as directed. Please refer to the Current Medication list given to you today.  *If you need a refill on your cardiac medications before your next appointment, please call your pharmacy*   Lab Work: TODAY:  TSH & FT4  If you have labs (blood work) drawn today and your tests are completely normal, you will receive your results only by: MyChart Message (if you have MyChart) OR A paper copy in the mail If you have any lab test that is abnormal or we need to change your treatment, we will call you to review the results.   Testing/Procedures: None ordered   Follow-Up: At [facility], you and your health needs are our priority.  As part of our continuing mission to provide you with exceptional heart care, we have created designated Provider Care Teams.  These Care Teams include your primary Cardiologist (physician) and Advanced Practice Providers (APPs -  Physician Assistants and Nurse Practitioners) who all work together to provide you with the care you need, when you need it.  We recommend signing up for the patient portal called "MyChart".  Sign up information is provided on this After Visit Summary.  MyChart is used to connect with patients for Virtual Visits (Telemedicine).  Patients are able to view lab/test results, encounter notes, upcoming appointments, etc.  Non-urgent messages can be sent to your provider as well.   To learn more about what you can do with MyChart, go to [email or web address].    Your next appointment:   6 month(s)  Provider:   Jari Favre, PA-C or [name], PA-C         Other Instructions     1st Floor: - Lobby - Registration  - Pharmacy  - Lab - Cafe  2nd Floor: - PV Lab - Diagnostic Testing (echo, CT, nuclear med)  3rd Floor: - Vacant  4th Floor: - TCTS (cardiothoracic surgery) - AFib Clinic - Structural Heart Clinic - Vascular  Surgery  - Vascular Ultrasound  5th Floor: - [facility] (general and EP) - Clinical Pharmacy for coumadin, hypertension, lipid, weight-loss medications, and med management appointments    Valet parking services will be available as well.

## 2023-08-25 LAB — T4, FREE: Free T4: 1.48 ng/dL (ref 0.82–1.77)

## 2023-08-25 LAB — TSH: TSH: 0.223 u[IU]/mL — ABNORMAL LOW (ref 0.450–4.500)

## 2023-08-28 ENCOUNTER — Telehealth: Payer: Self-pay | Admitting: Internal Medicine

## 2023-08-28 ENCOUNTER — Other Ambulatory Visit: Payer: Self-pay | Admitting: Internal Medicine

## 2023-08-28 NOTE — Telephone Encounter (Signed)
 Spoke with patient this morning.  She has an upcoming previsit and EGD/Colon.  However, she is having a tummy tuck procedure on April 2 and is concerned about having the procedures so soon after her other procedure.  Please call patient and advise.  Thank you.

## 2023-08-28 NOTE — Telephone Encounter (Signed)
 I spoke with the pt and discussed her tummy tuck and her upcoming procedures.  The pt has decided to reschedule her procedures to later appt to give her time to heal from the surgery.  She was transferred to the schedulers to get the appt moved.  She will call with any further questions or concerns

## 2023-09-01 ENCOUNTER — Encounter

## 2023-09-22 ENCOUNTER — Encounter: Admitting: Internal Medicine

## 2023-10-14 ENCOUNTER — Ambulatory Visit (AMBULATORY_SURGERY_CENTER): Admitting: *Deleted

## 2023-10-14 VITALS — Ht 64.0 in | Wt 125.0 lb

## 2023-10-14 DIAGNOSIS — Z1211 Encounter for screening for malignant neoplasm of colon: Secondary | ICD-10-CM

## 2023-10-14 DIAGNOSIS — R1310 Dysphagia, unspecified: Secondary | ICD-10-CM

## 2023-10-14 NOTE — Progress Notes (Signed)
 Pt's name and DOB verified at the beginning of the pre-visit wit 2 identifiers  Pt denies any difficulty with ambulating,sitting, laying down or rolling side to side  Pt has no issues moving head neck or swallowing  No egg or soy allergy known to patient   No issues known to pt with past sedation with any surgeries or procedures  No FH of Malignant Hyperthermia  Pt is not on home 02   Pt is not on blood thinners   Pt has frequent issues with constipation RN instructed pt to use Miralax per bottles instructions a week before prep days. Pt states they will  Pt is not on dialysis  Pt irregular heart rate PVC's   Pt denies any upcoming cardiac testing  Patient's chart reviewed by Rogena Class CNRA prior to pre-visit and patient appropriate for the [facility].  Pre-visit completed and red dot placed by patient's name on their procedure day (on provider's schedule).    Visit by phone  Pt states weight is 125 lb  IInstructions reviewed. Pt given  both [facility] main # and MD on call # prior to instructions.  Pt states understanding of instructions. Instructed pt to review instructions again prior to procedure and call main # given if has questions.. Pt states they will.   Instructed pt on where to find instructions on My Chart.

## 2023-10-27 NOTE — Progress Notes (Signed)
 [facility] History and Physical   Primary Care Physician:  Sharyne Degree, FNP   Reason for Procedure:  Evaluate odynophagia and heartburn symptoms and screening for colon cancer  Plan:    EGD and colonoscopy     HPI: Nichole Manning is a 58 y.o. female seen in January with complaints of heartburn and odynophagia, she also has a history of chronic recurrent BC powder use.  Interstitial lung disease also.  She had been using over-the-counter PPI and I prescribed 40 mg omeprazole  daily during that visit.  She has chronic constipation treated with Linzess.  Screening colonoscopy scheduled as well as EGD at that visit. Had tummy tuck surgery and axillary fat removal in April 2025. Some left side axillary pain afterwards.   Past Medical History:  Diagnosis Date   Anxiety    Constipation    Fibroids    Hyperlipidemia    Hypothyroidism    ILD (interstitial lung disease) (HCC)    Palpitations    PVC's (premature ventricular contractions)    SVD (spontaneous vaginal delivery)    x 3    Past Surgical History:  Procedure Laterality Date   ancillary surgery Bilateral    09/09/23 fat removal   APPENDECTOMY     laparoscopic   AUGMENTATION MAMMAPLASTY     BILATERAL SALPINGECTOMY Bilateral 07/01/2013   Procedure: BILATERAL SALPINGECTOMY;  Surgeon: Camillo Celestine, MD;  Location: [facility];  Service: Gynecology;  Laterality: Bilateral;   BREAST SURGERY     breast reduction x 2   LAPAROSCOPIC APPENDECTOMY N/A 09/09/2020   Procedure: APPENDECTOMY LAPAROSCOPIC;  Surgeon: Kinsinger, Alphonso Aschoff, MD;  Location: [facility];  Service: General;  Laterality: N/A;   TUBAL LIGATION     Tummy tuck     09/09/23   WISDOM TOOTH EXTRACTION      Prior to Admission medications   Medication Sig Start Date End Date Taking? Authorizing Provider  acetaminophen  (TYLENOL ) 325 MG tablet Take 2 tablets (650 mg total) by mouth every 6 (six) hours as needed for mild pain, fever or headache. Patient not  taking: Reported on 10/14/2023 09/10/20   Annetta Killian, PA-C  clindamycin (CLEOCIN) 300 MG capsule Take 300 mg by mouth 3 (three) times daily. 09/09/23   [provider]  Clindamycin-Benzoyl Per, Refr, gel Apply 1 application topically daily. 08/27/20   [provider]  cyanocobalamin (,VITAMIN B-12,) 1000 MCG/ML injection Inject 1,000 mcg into the muscle every 28 (twenty-eight) days. 08/08/20   [provider]  estradiol (ESTRACE) 0.5 MG tablet Take 0.5 mg by mouth daily. 10/23/22   [provider]  fluconazole (DIFLUCAN) 200 MG tablet Take 200 mg by mouth daily. 09/15/23   [provider]  hydrOXYzine (VISTARIL) 25 MG capsule Take 25-50 mg by mouth at bedtime as needed. 10/23/22   [provider]  ibuprofen (ADVIL) 800 MG tablet Take by mouth. 09/21/23   [provider]  levothyroxine  (SYNTHROID ) 75 MCG tablet Take 75 mcg by mouth every morning. 09/28/23   [provider]  lidocaine  (XYLOCAINE ) 2 % solution Use as directed 15 mLs in the mouth or throat as needed for mouth pain. Patient not taking: Reported on 10/14/2023 04/08/22   Wellington Half, NP  linaclotide [facility]) 145 MCG CAPS capsule Take 145 mg by mouth daily.    [provider]  minoxidil (LONITEN) 2.5 MG tablet Take 2.5 mg by mouth daily. 08/28/22   [provider]  Multiple Vitamin (MULTIVITAMIN WITH MINERALS) TABS tablet Take  1 tablet by mouth daily.    [provider]  omeprazole  (PRILOSEC) 40 MG capsule TAKE 1 CAPSULE (40 MG TOTAL) BY MOUTH DAILY BEFORE BREAKFAST. 30 MINS BEFORE Patient not taking: Reported on 10/14/2023 08/28/23   Kenney Peacemaker, MD  OZEMPIC, 0.25 OR 0.5 MG/DOSE, 2 MG/3ML SOPN Inject 0.25 mg as directed once a week. 05/12/23   [provider]  progesterone (PROMETRIUM) 200 MG capsule Take 200 mg by mouth at bedtime. PRN 10/23/22   [provider]  promethazine (PHENERGAN) 12.5 MG tablet Take 12.5 mg by mouth every 4  (four) hours as needed. 09/05/23   [provider]  propranolol  ER (INDERAL  LA) 60 MG 24 hr capsule Take 1 capsule (60 mg total) by mouth daily. 05/11/23   Sheryle Donning, MD  rosuvastatin  (CRESTOR ) 5 MG tablet Take 1 tablet (5 mg total) by mouth daily. 05/25/23 08/24/23  Von Grumbling, PA-C  SPIRIVA RESPIMAT 2.5 MCG/ACT AERS Inhale 2 puffs into the lungs daily at 6 (six) AM. 05/14/23   [provider]  SUFLAVE  178.7 g SOLR Take by mouth as directed. colonoscopy 07/16/23   [provider]  SYMBICORT 160-4.5 MCG/ACT inhaler Inhale 2 puffs into the lungs as needed. 09/23/22   [provider]  Tretinoin (ALTRENO) 0.05 % LOTN Apply 0.05 % topically. 05/23/22   [provider]  venlafaxine XR (EFFEXOR-XR) 37.5 MG 24 hr capsule Take 37.5 mg by mouth at bedtime. 09/29/23   [provider]  vitamin C (ASCORBIC ACID) 500 MG tablet Take 500 mg by mouth daily.    [provider]    Current Outpatient Medications  Medication Sig Dispense Refill   estradiol (ESTRACE) 0.5 MG tablet Take 0.5 mg by mouth daily.     levothyroxine  (SYNTHROID ) 75 MCG tablet Take 75 mcg by mouth every morning.     linaclotide (LINZESS) 145 MCG CAPS capsule Take 145 mg by mouth daily.     minoxidil (LONITEN) 2.5 MG tablet Take 2.5 mg by mouth daily.     Multiple Vitamin (MULTIVITAMIN WITH MINERALS) TABS tablet Take 1 tablet by mouth daily.     progesterone (PROMETRIUM) 200 MG capsule Take 200 mg by mouth at bedtime. PRN     propranolol  ER (INDERAL  LA) 60 MG 24 hr capsule Take 1 capsule (60 mg total) by mouth daily. 30 capsule 6   rosuvastatin  (CRESTOR ) 5 MG tablet Take 1 tablet (5 mg total) by mouth daily. 90 tablet 3   SUFLAVE  178.7 g SOLR Take by mouth as directed. colonoscopy     vitamin C (ASCORBIC ACID) 500 MG tablet Take 500 mg by mouth daily.     acetaminophen  (TYLENOL ) 325 MG tablet Take 2 tablets (650 mg total) by mouth every 6 (six) hours as needed for mild  pain, fever or headache. (Patient not taking: Reported on 10/28/2023)     Clindamycin-Benzoyl Per, Refr, gel Apply 1 application topically daily.     cyanocobalamin (,VITAMIN B-12,) 1000 MCG/ML injection Inject 1,000 mcg into the muscle every 28 (twenty-eight) days.     hydrOXYzine (VISTARIL) 25 MG capsule Take 25-50 mg by mouth at bedtime as needed.     OZEMPIC, 0.25 OR 0.5 MG/DOSE, 2 MG/3ML SOPN Inject 0.25 mg as directed once a week.     promethazine (PHENERGAN) 12.5 MG tablet Take 12.5 mg by mouth every 4 (four) hours as needed. (Patient not taking: Reported on 10/28/2023)     SPIRIVA RESPIMAT 2.5 MCG/ACT AERS Inhale 2 puffs into the  lungs daily at 6 (six) AM.     SYMBICORT 160-4.5 MCG/ACT inhaler Inhale 2 puffs into the lungs as needed.     Tretinoin (ALTRENO) 0.05 % LOTN Apply 0.05 % topically.     venlafaxine XR (EFFEXOR-XR) 37.5 MG 24 hr capsule Take 37.5 mg by mouth at bedtime. (Patient not taking: Reported on 10/28/2023)     Current Facility-Administered Medications  Medication Dose Route Frequency Provider Last Rate Last Admin   0.9 %  sodium chloride  infusion  500 mL Intravenous Once Kenney Peacemaker, MD        Allergies as of 10/28/2023 - Review Complete 10/28/2023  Allergen Reaction Noted   Sulfa antibiotics Hives 06/20/2013   Adhesive [tape] Itching 06/20/2013   Amoxicillin Nausea Only 01/29/2023   Codeine Nausea And Vomiting 06/20/2013    Family History  Adopted: Yes  Problem Relation Age of Onset   Breast cancer Mother 45   Cancer Father        bile duct cancer died in his 8's   Colon polyps Neg Hx    Colon cancer Neg Hx    Esophageal cancer Neg Hx    Stomach cancer Neg Hx    Rectal cancer Neg Hx     Social History   Socioeconomic History   Marital status: Married    Spouse name: Not on file   Number of children: Not on file   Years of education: Not on file   Highest education level: Not on file  Occupational History   Not on file  Tobacco Use   Smoking  status: Former    Current packs/day: 1.50    Average packs/day: 1.5 packs/day for 30.0 years (45.0 ttl pk-yrs)    Types: Cigarettes   Smokeless tobacco: Never   Tobacco comments:    20 cigarettes per day 11/16/19 [facility]   Vaping Use   Vaping status: Some Days  Substance and Sexual Activity   Alcohol use: Yes    Comment: social   Drug use: No   Sexual activity: Yes    Birth control/protection: Surgical  Other Topics Concern   Not on file  Social History Narrative   Divorced 1 son and 1 daughter   2-story home   Left handed   1 caffeinated beverage daily   She has been a Librarian, 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  Physical Exam: Vital signs BP 105/69   Pulse 75   Temp (!) 97.5 F (36.4 C) (Temporal)   Resp 11   Ht 5\' 4"  (1.626 m)   Wt 125 lb (56.7 kg)   SpO2 100%   BMI 21.46 kg/m   General:   Alert,  Well-developed, well-nourished, pleasant and cooperative in NAD Lungs:  Clear throughout to auscultation.   Heart:  Regular rate and rhythm; no murmurs, clicks, rubs,  or gallops. Abdomen:  Soft, nontender and nondistended. Normal bowel sounds.  Low scar taped Neuro/Psych:  Alert and cooperative. Normal mood and affect. A and O x 3   @  Kenney Peacemaker, MD, [facility] [phone] (pager) 10/28/2023 11:36 AM@

## 2023-10-28 ENCOUNTER — Encounter: Payer: Self-pay | Admitting: Internal Medicine

## 2023-10-28 ENCOUNTER — Ambulatory Visit: Admitting: Internal Medicine

## 2023-10-28 VITALS — BP 90/64 | HR 70 | Temp 97.5°F | Resp 10 | Ht 64.0 in | Wt 125.0 lb

## 2023-10-28 DIAGNOSIS — R1310 Dysphagia, unspecified: Secondary | ICD-10-CM

## 2023-10-28 DIAGNOSIS — K635 Polyp of colon: Secondary | ICD-10-CM | POA: Diagnosis not present

## 2023-10-28 DIAGNOSIS — D123 Benign neoplasm of transverse colon: Secondary | ICD-10-CM

## 2023-10-28 DIAGNOSIS — K319 Disease of stomach and duodenum, unspecified: Secondary | ICD-10-CM

## 2023-10-28 DIAGNOSIS — K5730 Diverticulosis of large intestine without perforation or abscess without bleeding: Secondary | ICD-10-CM | POA: Diagnosis not present

## 2023-10-28 DIAGNOSIS — Z1211 Encounter for screening for malignant neoplasm of colon: Secondary | ICD-10-CM

## 2023-10-28 MED ORDER — SODIUM CHLORIDE 0.9 % IV SOLN: 500.0000 mL | Freq: Once | INTRAVENOUS | Status: DC

## 2023-10-28 NOTE — Op Note (Signed)
 [facility] Patient Name: Nichole Manning Procedure Date: 10/28/2023 11:26 AM MRN: [id number] Endoscopist: Kenney Peacemaker , MD, [id number] Age: 58 Referring MD:  Date of Birth: 06-29-1965 Gender: Female Account #: [id number] Procedure:                Colonoscopy Indications:              Screening for colorectal malignant neoplasm Medicines:                Monitored Anesthesia Care Procedure:                Pre-Anesthesia Assessment:                           - Prior to the procedure, a History and Physical                            was performed, and patient medications and                            allergies were reviewed. The patient's tolerance of                            previous anesthesia was also reviewed. The risks                            and benefits of the procedure and the sedation                            options and risks were discussed with the patient.                            All questions were answered, and informed consent                            was obtained. Prior Anticoagulants: The patient has                            taken no anticoagulant or antiplatelet agents. ASA                            Grade Assessment: II - A patient with mild systemic                            disease. After reviewing the risks and benefits,                            the patient was deemed in satisfactory condition to                            undergo the procedure.                           After obtaining informed consent, the colonoscope  was passed under direct vision. Throughout the                            procedure, the patient's blood pressure, pulse, and                            oxygen saturations were monitored continuously. The                            [id number]-[id number] Colonoscope [id number] was introduced                            through the anus and advanced to the the cecum,                            identified by  appendiceal orifice and ileocecal                            valve. The colonoscopy was performed without                            difficulty. The patient tolerated the procedure                            well. The quality of the bowel preparation was                            good. The ileocecal valve, appendiceal orifice, and                            rectum were photographed. The bowel preparation                            used was SUFLAVE  via split dose instruction. Scope In: 11:45:19 AM Scope Out: 11:59:31 AM Scope Withdrawal Time: 0 hours 10 minutes 29 seconds  Total Procedure Duration: 0 hours 14 minutes 12 seconds  Findings:                 The perianal and digital rectal examinations were                            normal.                           A diminutive polyp was found in the transverse                            colon. The polyp was sessile. The polyp was removed                            with a cold snare. Resection and retrieval were                            complete. Verification of patient identification  for the specimen was done. Estimated blood loss was                            minimal.                           A few small-mouthed diverticula were found in the                            sigmoid colon.                           The exam was otherwise without abnormality on                            direct and retroflexion views. Complications:            No immediate complications. Estimated Blood Loss:     Estimated blood loss was minimal. Impression:               - One diminutive polyp in the transverse colon,                            removed with a cold snare. Resected and retrieved.                           - Diverticulosis in the sigmoid colon.                           - The examination was otherwise normal on direct                            and retroflexion views. Recommendation:           - Patient has a  contact number available for                            emergencies. The signs and symptoms of potential                            delayed complications were discussed with the                            patient. Return to normal activities tomorrow.                            Written discharge instructions were provided to the                            patient.                           - Resume previous diet.                           - Continue present medications.                           -  Repeat colonoscopy is recommended. The                            colonoscopy date will be determined after pathology                            results from today's exam become available for                            review. Kenney Peacemaker, MD 10/28/2023 12:11:12 PM This report has been signed electronically.

## 2023-10-28 NOTE — Op Note (Signed)
 [facility] Patient Name: Nichole Manning Procedure Date: 10/28/2023 11:27 AM MRN: [id number] Endoscopist: Kenney Peacemaker , MD, [id number] Age: 58 Referring MD:  Date of Birth: [birth year] Gender: Female Account #: [id number] Procedure:                Upper GI endoscopy Indications:              Odynophagia, Heartburn Medicines:                Monitored Anesthesia Care Procedure:                Pre-Anesthesia Assessment:                           - Prior to the procedure, a History and Physical                            was performed, and patient medications and                            allergies were reviewed. The patient's tolerance of                            previous anesthesia was also reviewed. The risks                            and benefits of the procedure and the sedation                            options and risks were discussed with the patient.                            All questions were answered, and informed consent                            was obtained. Prior Anticoagulants: The patient has                            taken no anticoagulant or antiplatelet agents. ASA                            Grade Assessment: II - A patient with mild systemic                            disease. After reviewing the risks and benefits,                            the patient was deemed in satisfactory condition to                            undergo the procedure.                           After obtaining informed consent, the endoscope was  passed under direct vision. Throughout the                            procedure, the patient's blood pressure, pulse, and                            oxygen saturations were monitored continuously. The                            GIF [id number] #[id number] was introduced through the                            mouth, and advanced to the second part of duodenum.                            The upper GI endoscopy was  accomplished without                            difficulty. The patient tolerated the procedure                            well. Scope In: Scope Out: Findings:                 Patchy inflammation characterized by congestion                            (edema) and erythema was found in the gastric                            antrum. Biopsies were taken with a cold forceps for                            histology. Verification of patient identification                            for the specimen was done. Estimated blood loss was                            minimal.                           The exam was otherwise without abnormality.                           The cardia and gastric fundus were normal on                            retroflexion. Complications:            No immediate complications. Estimated Blood Loss:     Estimated blood loss was minimal. Impression:               - Gastritis. Biopsied.                           - The examination  was otherwise normal. No cause of                            heartburn or odynophagia seen. She reports                            significant symptom improveement after taking 1                            month of omeprazole  40 mg daily which was                            prescribed in 06/2023 (and stopped after 1 month of                            treatment) Recommendation:           - Patient has a contact number available for                            emergencies. The signs and symptoms of potential                            delayed complications were discussed with the                            patient. Return to normal activities tomorrow.                            Written discharge instructions were provided to the                            patient.                           - Resume previous diet.                           - Continue present medications.                           - Await pathology results.                            - See the other procedure note for documentation of                            additional recommendations. Kenney Peacemaker, MD 10/28/2023 12:06:57 PM This report has been signed electronically.

## 2023-10-28 NOTE — Progress Notes (Signed)
 Report to PACU, RN, vss, BBS= Clear.

## 2023-10-28 NOTE — Progress Notes (Signed)
 Called to room to assist during endoscopic procedure.  Patient ID and intended procedure confirmed with present staff. Received instructions for my participation in the procedure from the performing physician.

## 2023-10-28 NOTE — Progress Notes (Signed)
 Pt's states no medical or surgical changes since previsit or office visit.

## 2023-10-28 NOTE — Patient Instructions (Addendum)
 The esophagus is normal. Stomach looks inflamed - suspect gastritis. Biopsies taken.  Colonoscopy - one tiny polyp removed.  You also have a condition called diverticulosis - common and not usually a problem. Please read the handout provided.  After pathology reports in I will contact you with results and plans.  I appreciate the opportunity to care for you. Kenney Peacemaker, MD, FACG  YOU HAD AN ENDOSCOPIC PROCEDURE TODAY AT THE [facility]:   Refer to the procedure report that was given to you for any specific questions about what was found during the examination.  If the procedure report does not answer your questions, please call your gastroenterologist to clarify.  If you requested that your care partner not be given the details of your procedure findings, then the procedure report has been included in a sealed envelope for you to review at your convenience later.  YOU SHOULD EXPECT: Some feelings of bloating in the abdomen. Passage of more gas than usual.  Walking can help get rid of the air that was put into your GI tract during the procedure and reduce the bloating. If you had a lower endoscopy (such as a colonoscopy or flexible sigmoidoscopy) you may notice spotting of blood in your stool or on the toilet paper. If you underwent a bowel prep for your procedure, you may not have a normal bowel movement for a few days.  Please Note:  You might notice some irritation and congestion in your nose or some drainage.  This is from the oxygen used during your procedure.  There is no need for concern and it should clear up in a day or so.  SYMPTOMS TO REPORT IMMEDIATELY:  Following lower endoscopy (colonoscopy or flexible sigmoidoscopy):  Excessive amounts of blood in the stool  Significant tenderness or worsening of abdominal pains  Swelling of the abdomen that is new, acute  Fever of 100F or higher  Following upper endoscopy (EGD)  Vomiting of blood or coffee ground  material  New chest pain or pain under the shoulder blades  Painful or persistently difficult swallowing  New shortness of breath  Fever of 100F or higher  Black, tarry-looking stools  For urgent or emergent issues, a gastroenterologist can be reached at any hour by calling [phone]. Do not use MyChart messaging for urgent concerns.    DIET:  We do recommend a small meal at first, but then you may proceed to your regular diet.  Drink plenty of fluids but you should avoid alcoholic beverages for 24 hours.  ACTIVITY:  You should plan to take it easy for the rest of today and you should NOT DRIVE or use heavy machinery until tomorrow (because of the sedation medicines used during the test).    FOLLOW UP: Our staff will call the number listed on your records the next business day following your procedure.  We will call around 7:15- 8:00 am to check on you and address any questions or concerns that you may have regarding the information given to you following your procedure. If we do not reach you, we will leave a message.     If any biopsies were taken you will be contacted by phone or by letter within the next 1-3 weeks.  Please call us  at [phone] if you have not heard about the biopsies in 3 weeks.    SIGNATURES/CONFIDENTIALITY: You and/or your care partner have signed paperwork which will be entered into your electronic medical record.  These signatures  attest to the fact that that the information above on your After Visit Summary has been reviewed and is understood.  Full responsibility of the confidentiality of this discharge information lies with you and/or your care-partner.

## 2023-10-29 ENCOUNTER — Telehealth: Payer: Self-pay

## 2023-10-29 NOTE — Telephone Encounter (Signed)
 Attempted to reach patient for follow up phone call. No answer, unable to leave voicemail due to busy line.

## 2023-10-30 LAB — SURGICAL PATHOLOGY

## 2023-11-02 ENCOUNTER — Ambulatory Visit: Payer: Self-pay | Admitting: Internal Medicine

## 2023-11-06 NOTE — Progress Notes (Signed)
 NEUROLOGY FOLLOW UP OFFICE NOTE  Nichole Manning [id number]  Assessment/Plan:   Migraine without aura, without status migrainosus, not intractable Ocular migraine   Migraine prevention:  Plan to start Aimovig 140mg  every 28 days.  On propranolol  (BP low so wouldn't increase and advised to discuss with cardiology whether it needs to be changed), just started on venlafaxine again but not sure she wants to take it.   Migraine rescue:  She will retry Nurtec.  Did not tolerate Ubrelvy and was ineffective.  Triptans are contraindicated due to her cardiac history. Limit use of pain relievers to no more than 2 days out of week to prevent risk of rebound or medication-overuse headache. Keep headache diary Lifestyle modification:  discussed caffeine cessation, improved sleep hygiene Follow up 6 months.    Subjective:  Nichole Manning is a 58 year old female with COPD, hypothyroidism, anxiety and palpitations (PSVT, PVCs, PACs) who follows up for facial asymmetry and migraines.  UPDATE: Last seen in June 2024.  For further evaluation of facial asymmetry, she had MRI of brain without contrast on 10/10/2022 which showed mild for age chronic small vessel ischemic changes but otherwise unremarkable.  At that time, she was started on propranolol  for migraines  Having ocular migraines, seeing white broken line and spots for 30 minutes. May have a mild headache afterwards.  More frequent now, occurring once a week.  Reports under more situational stress.   7/10 bifrontal pressure headache, sometimes with nausea.  No photophobia and phonophobia.  Sometimes associated with the visual aura.  Occurs 3 to 4 days a week.  Treats with Tylenol  or BC or Goody powder.  Diagnosed with diverticulitis.  Did not tolerate Ubrelvy.     Current medications:  propranolol  ER 60mg  daily (cardiology), venlafaxine XR 37.5mg  daily (just prescribed, never picked it up)  She has 1 cup of coffee daily.  She drinks a lot  of water.  She doesn't skip meals.  She exercises.  Tries to walk 1 mile a day.  Sleep is poor.  She has trouble falling asleep and staying asleep.  Takes a couple of hours to fall asleep even with Tylenol  PM.  Will often watch TV or her phone.    HISTORY: Facial asymmetry: She had cosmetic workup on her face (lip lift) in April 2024.  On 10/09/2022, she noted an odd sensation over the left lower cheek and jaw region.  The next morning, she noted a mild left sided facial droop.  Denies difficulty closing the left eye, moving her eyebrow, trouble swallowing, double vision, altered taste and hearing or ear/facial/neck pain.  No involvement of her extremities.  She contacted her cardiologist who advised her to go to the ED.  In the ED, the provider noted slight facial asymmetry but uncertain if it was due to edema on the RIGHT side because of the surgery.  MRI of brain showed mild chronic small vessel ischemic changes within normal limits for age.  She followed up with her plastic surgeon who told her that it is unlikely related to the procedure and that she may have had a Bell's palsy.  No preceding illness.  She thinks the asymmetry is slightly improved.  Migraines: She also has longstanding history of migraines since young adulthood.  They have been daily for the past couple of years.  She describes 8/10 diffuse (but primarily bifrontal) pressure/throbbing headache with nausea.  Wakes up every morning with them, takes a Goody powder and resolves within 1 to  2 hours.  However, will return later in the afternoon or evening.  In addition, she has episodes of visual disturbance, described as white lightening bolts that impede her vision in both eyes that slowly move to the periphery before disappearing, lasting up to an hour.  Occurs once a month.    She is followed by cardiology for PVCs, PACs and PSVTs.  Started on propranolol  ER 60mg  daily.  Notices that she hasn't been waking up with the headaches but will  develop them later in the morning.    Prior medications include topiramate.  Other history: In June 2020, she had COVID with upper respiratory symptoms, ringing and aural fullness in the ears and loss of taste and smell.  At that time, she woke up one morning with vertigo and when she started praying out loud, she noted her speech was slurred and was speaking jibberish.  No lateralizing symptoms (facial droop or unilateral numbness or weakness).  Seen in the ED.  CT head and CTA head and neck were negative.  Diagnosed with peripheral vertigo.     PAST MEDICAL HISTORY: Past Medical History:  Diagnosis Date   Anxiety    Constipation    Fibroids    Hyperlipidemia    Hypothyroidism    ILD (interstitial lung disease) (HCC)    Palpitations    PVC's (premature ventricular contractions)    SVD (spontaneous vaginal delivery)    x 3    MEDICATIONS: Current Outpatient Medications on File Prior to Visit  Medication Sig Dispense Refill   acetaminophen  (TYLENOL ) 325 MG tablet Take 2 tablets (650 mg total) by mouth every 6 (six) hours as needed for mild pain, fever or headache. (Patient not taking: Reported on 10/28/2023)     Clindamycin-Benzoyl Per, Refr, gel Apply 1 application topically daily.     cyanocobalamin (,VITAMIN B-12,) 1000 MCG/ML injection Inject 1,000 mcg into the muscle every 28 (twenty-eight) days.     estradiol (ESTRACE) 0.5 MG tablet Take 0.5 mg by mouth daily.     hydrOXYzine (VISTARIL) 25 MG capsule Take 25-50 mg by mouth at bedtime as needed.     levothyroxine  (SYNTHROID ) 75 MCG tablet Take 75 mcg by mouth every morning.     linaclotide (LINZESS) 145 MCG CAPS capsule Take 145 mg by mouth daily.     minoxidil (LONITEN) 2.5 MG tablet Take 2.5 mg by mouth daily.     Multiple Vitamin (MULTIVITAMIN WITH MINERALS) TABS tablet Take 1 tablet by mouth daily.     OZEMPIC, 0.25 OR 0.5 MG/DOSE, 2 MG/3ML SOPN Inject 0.25 mg as directed once a week.     progesterone (PROMETRIUM) 200 MG  capsule Take 200 mg by mouth at bedtime. PRN     promethazine (PHENERGAN) 12.5 MG tablet Take 12.5 mg by mouth every 4 (four) hours as needed. (Patient not taking: Reported on 10/28/2023)     propranolol  ER (INDERAL  LA) 60 MG 24 hr capsule Take 1 capsule (60 mg total) by mouth daily. 30 capsule 6   rosuvastatin  (CRESTOR ) 5 MG tablet Take 1 tablet (5 mg total) by mouth daily. 90 tablet 3   SPIRIVA RESPIMAT 2.5 MCG/ACT AERS Inhale 2 puffs into the lungs daily at 6 (six) AM.     SUFLAVE  178.7 g SOLR Take by mouth as directed. colonoscopy     SYMBICORT 160-4.5 MCG/ACT inhaler Inhale 2 puffs into the lungs as needed.     Tretinoin (ALTRENO) 0.05 % LOTN Apply 0.05 % topically.     venlafaxine  XR (EFFEXOR-XR) 37.5 MG 24 hr capsule Take 37.5 mg by mouth at bedtime. (Patient not taking: Reported on 10/28/2023)     vitamin C (ASCORBIC ACID) 500 MG tablet Take 500 mg by mouth daily.     No current facility-administered medications on file prior to visit.    ALLERGIES: Allergies  Allergen Reactions   Sulfa Antibiotics Hives   Adhesive [Tape] Itching    Rxn was from [facility].   Amoxicillin Nausea Only   Codeine Nausea And Vomiting    FAMILY HISTORY: Family History  Adopted: Yes  Problem Relation Age of Onset   Breast cancer Mother 60   Cancer Father        bile duct cancer died in his 71's   Colon polyps Neg Hx    Colon cancer Neg Hx    Esophageal cancer Neg Hx    Stomach cancer Neg Hx    Rectal cancer Neg Hx       Objective:  Blood pressure (!) 86/60, pulse 74, height 5\' 5"  (1.651 m), weight 129 lb (58.5 kg), SpO2 95%. General: No acute distress.  Patient appears well-groomed.   Head:  Normocephalic/atraumatic Eyes:  Fundi examined but not visualized Neck: supple, no paraspinal tenderness, full range of motion Heart:  Regular rate and rhythm Neurological Exam: alert and oriented.  Speech fluent and not dysarthric, language intact.  CN II-XII intact. Bulk and tone normal, muscle  strength 5/5 throughout.  Sensation to light touch intact.  Deep tendon reflexes 2+ throughout, toes downgoing.  Finger to nose testing intact.  Gait normal, Romberg negative.   Janne Members, DO  CC: Rueben Cote, FNP

## 2023-11-07 ENCOUNTER — Other Ambulatory Visit: Payer: Self-pay | Admitting: Cardiology

## 2023-11-09 ENCOUNTER — Ambulatory Visit: Payer: Medicaid Other | Admitting: Neurology

## 2023-11-09 ENCOUNTER — Encounter: Payer: Self-pay | Admitting: Neurology

## 2023-11-09 VITALS — BP 86/60 | HR 74 | Ht 65.0 in | Wt 129.0 lb

## 2023-11-09 DIAGNOSIS — G43109 Migraine with aura, not intractable, without status migrainosus: Secondary | ICD-10-CM | POA: Diagnosis not present

## 2023-11-09 MED ORDER — AIMOVIG 140 MG/ML ~~LOC~~ SOAJ: 140.0000 mg | SUBCUTANEOUS | 5 refills | Status: AC

## 2023-11-09 NOTE — Patient Instructions (Signed)
 Plan to start Aimovig injection every 28 days.  If no improvement after 3 months, contact me At earliest onset of migraine (headache or visual symptom), take Nurtec (1 in 24 hours).  Let me know if it works Limit use of pain relievers to no more than 9 days out of the month to prevent risk of rebound or medication-overuse headache. Keep headache diary

## 2023-11-11 ENCOUNTER — Telehealth: Payer: Self-pay | Admitting: Internal Medicine

## 2023-11-11 NOTE — Telephone Encounter (Signed)
 I recommend stopping the ozempic

## 2023-11-11 NOTE — Telephone Encounter (Signed)
 Patient resumed Ozempic last week. Her appetite had come back and she felt she needed to resume it. Reports no bowel movement for 4 - 5 days. Developed LLQ pain 3 or 4 days ago. It has remained unchanged in intensity. She drank magnesium citrate yesterday. Passed liquid stool last night. Taking Linzess daily. States it is unusual for her to not have a bowel movement while she is taking Linzess. She is most concerned because the pain is unusual. She fears a "blockage."

## 2023-11-11 NOTE — Telephone Encounter (Signed)
 Patient called and stated that she had a colonoscopy on may the 21 st and is having major issue with constipation. Patient stated that now wondering is she has a blockage or can it be due to her having diverticulitis. Patient is requesting a call back. .Please advise.

## 2023-11-11 NOTE — Telephone Encounter (Signed)
 Patient advised.

## 2023-11-14 ENCOUNTER — Emergency Department (HOSPITAL_BASED_OUTPATIENT_CLINIC_OR_DEPARTMENT_OTHER)
Admission: EM | Admit: 2023-11-14 | Discharge: 2023-11-14 | Disposition: A | Discharge status: Home / Self Care | Attending: Emergency Medicine | Admitting: Emergency Medicine

## 2023-11-14 ENCOUNTER — Emergency Department (HOSPITAL_BASED_OUTPATIENT_CLINIC_OR_DEPARTMENT_OTHER)

## 2023-11-14 ENCOUNTER — Other Ambulatory Visit: Payer: Self-pay

## 2023-11-14 ENCOUNTER — Encounter (HOSPITAL_BASED_OUTPATIENT_CLINIC_OR_DEPARTMENT_OTHER): Payer: Self-pay | Admitting: *Deleted

## 2023-11-14 DIAGNOSIS — N3000 Acute cystitis without hematuria: Secondary | ICD-10-CM | POA: Insufficient documentation

## 2023-11-14 DIAGNOSIS — R109 Unspecified abdominal pain: Secondary | ICD-10-CM | POA: Diagnosis present

## 2023-11-14 DIAGNOSIS — R1084 Generalized abdominal pain: Secondary | ICD-10-CM

## 2023-11-14 LAB — URINALYSIS, ROUTINE W REFLEX MICROSCOPIC
Bilirubin Urine: NEGATIVE
Glucose, UA: NEGATIVE mg/dL
Hgb urine dipstick: NEGATIVE
Ketones, ur: NEGATIVE mg/dL
Nitrite: NEGATIVE
Protein, ur: NEGATIVE mg/dL
Specific Gravity, Urine: 1.013 (ref 1.005–1.030)
pH: 5 (ref 5.0–8.0)

## 2023-11-14 LAB — COMPREHENSIVE METABOLIC PANEL WITH GFR
ALT: 21 U/L (ref 0–44)
AST: 20 U/L (ref 15–41)
Albumin: 4.3 g/dL (ref 3.5–5.0)
Alkaline Phosphatase: 66 U/L (ref 38–126)
Anion gap: 11 (ref 5–15)
BUN: 14 mg/dL (ref 6–20)
CO2: 25 mmol/L (ref 22–32)
Calcium: 9.4 mg/dL (ref 8.9–10.3)
Chloride: 104 mmol/L (ref 98–111)
Creatinine, Ser: 0.83 mg/dL (ref 0.44–1.00)
GFR, Estimated: >60 mL/min (ref >60)
Glucose, Bld: 94 mg/dL (ref 70–99)
Potassium: 3.9 mmol/L (ref 3.5–5.1)
Sodium: 140 mmol/L (ref 135–145)
Total Bilirubin: 0.2 mg/dL (ref 0.0–1.2)
Total Protein: 6.7 g/dL (ref 6.5–8.1)

## 2023-11-14 LAB — CBC
HCT: 39.6 % (ref 36.0–46.0)
Hemoglobin: 13.2 g/dL (ref 12.0–15.0)
MCH: 33 pg (ref 26.0–34.0)
MCHC: 33.3 g/dL (ref 30.0–36.0)
MCV: 99 fL (ref 80.0–100.0)
Platelets: 227 10*3/uL (ref 150–400)
RBC: 4 MIL/uL (ref 3.87–5.11)
RDW: 12.2 % (ref 11.5–15.5)
WBC: 8.4 10*3/uL (ref 4.0–10.5)
nRBC: 0 % (ref 0.0–0.2)

## 2023-11-14 LAB — LIPASE, BLOOD: Lipase: 74 U/L — ABNORMAL HIGH (ref 11–51)

## 2023-11-14 MED ORDER — DICYCLOMINE HCL 20 MG PO TABS
20.0000 mg | ORAL_TABLET | Freq: Two times a day (BID) | ORAL | 0 refills | Status: DC
Start: 2023-11-14 — End: 2024-01-14

## 2023-11-14 MED ORDER — NITROFURANTOIN MONOHYD MACRO 100 MG PO CAPS: 100.0000 mg | ORAL_CAPSULE | Freq: Two times a day (BID) | ORAL | 0 refills | Status: DC

## 2023-11-14 MED ORDER — IOHEXOL 300 MG/ML  SOLN
100.0000 mL | Freq: Once | INTRAMUSCULAR | Status: AC | PRN
  Administered 2023-11-14: 100 mL via INTRAVENOUS

## 2023-11-14 NOTE — ED Provider Notes (Signed)
 [place] EMERGENCY DEPARTMENT AT [facility] Provider Note   CSN: [id number] Arrival date & time: 11/14/23  1636     History  Chief Complaint  Patient presents with   Abdominal Pain    Nichole Manning is a 58 y.o. female.  Patient with history of appendectomy, subsequent hernia repair, known history of diverticulosis --presents to the emergency department for evaluation of abdominal pain for 8 days.  Patient states that 8 days ago at onset, she awoke with vomiting.  Vomiting improved however she has had persistent aching pain which started on the left side of the abdomen and then moved to below her belly button during this time.  Pain is always present but is waxing and waning and she does have episodes of more severe pain at times.  No fevers.  She has been more constipated than usual despite taking Linzess.  She has tried mag citrate at home.  She denies urinary symptoms.  She reports having a tummy tuck surgery about 8 weeks ago.  Otherwise no complications after this.       Home Medications Prior to Admission medications   Medication Sig Start Date End Date Taking? Authorizing Provider  Clindamycin-Benzoyl Per, Refr, gel Apply 1 application topically daily. 08/27/20   [provider]  cyanocobalamin (,VITAMIN B-12,) 1000 MCG/ML injection Inject 1,000 mcg into the muscle every 28 (twenty-eight) days. 08/08/20   [provider]  Erenumab -aooe (AIMOVIG ) 140 MG/ML SOAJ Inject 140 mg into the skin every 28 (twenty-eight) days. 11/09/23   Merriam Abbey, DO  estradiol (ESTRACE) 0.5 MG tablet Take 0.5 mg by mouth daily. 10/23/22   [provider]  hydrOXYzine (VISTARIL) 25 MG capsule Take 25-50 mg by mouth at bedtime as needed. 10/23/22   [provider]  levothyroxine  (SYNTHROID ) 88 MCG tablet Take 88 mcg by mouth daily. 11/05/23   [provider]  linaclotide (LINZESS) 145 MCG CAPS capsule Take 145 mg by mouth daily.    [provider]  minoxidil (LONITEN) 2.5 MG tablet Take 2.5 mg by mouth daily. 08/28/22   [provider]  Multiple Vitamin (MULTIVITAMIN WITH MINERALS) TABS tablet Take 1 tablet by mouth daily.    [provider]  OZEMPIC, 0.25 OR 0.5 MG/DOSE, 2 MG/3ML SOPN Inject 0.25 mg as directed once a week. 05/12/23   [provider]  progesterone (PROMETRIUM) 200 MG capsule Take 200 mg by mouth at bedtime. PRN 10/23/22   [provider]  promethazine (PHENERGAN) 12.5 MG tablet Take 12.5 mg by mouth every 4 (four) hours as needed. 09/05/23   [provider]  propranolol  ER (INDERAL  LA) 60 MG 24 hr capsule TAKE 1 CAPSULE BY MOUTH EVERY DAY 11/09/23   Sheryle Donning, MD  rosuvastatin  (CRESTOR ) 5 MG tablet Take 1 tablet (5 mg total) by mouth daily. 05/25/23 11/09/23  Von Grumbling, PA-C  SPIRIVA RESPIMAT 2.5 MCG/ACT AERS Inhale 2 puffs into the lungs daily at 6 (six) AM. 05/14/23   [provider]  SYMBICORT 160-4.5 MCG/ACT inhaler Inhale 2 puffs into the lungs as needed. 09/23/22   [provider]  Tretinoin (ALTRENO) 0.05 % LOTN Apply 0.05 % topically. 05/23/22   [provider]  venlafaxine XR (EFFEXOR-XR) 37.5 MG 24 hr capsule Take 37.5 mg by mouth at bedtime. 09/29/23   [provider]  vitamin C (ASCORBIC ACID) 500 MG tablet Take 500 mg by mouth daily.    [provider]      Allergies  Sulfa antibiotics, Adhesive [tape], Amoxicillin, and Codeine    Review of Systems   Review of Systems  Physical Exam Updated Vital Signs BP 104/83 (BP Location: Right Arm)   Pulse 68   Temp 98.4 F (36.9 C) (Oral)   Resp 14   SpO2 100%   Physical Exam Vitals and nursing note reviewed.  Constitutional:      General: She is not in acute distress.    Appearance: She is well-developed.  HENT:     Head: Normocephalic and atraumatic.     Right Ear: External ear normal.     Left Ear: External ear normal.     Nose: Nose  normal.  Eyes:     Conjunctiva/sclera: Conjunctivae normal.  Cardiovascular:     Rate and Rhythm: Normal rate and regular rhythm.     Heart sounds: No murmur heard. Pulmonary:     Effort: No respiratory distress.     Breath sounds: No wheezing, rhonchi or rales.  Abdominal:     Palpations: Abdomen is soft.     Tenderness: There is abdominal tenderness in the periumbilical area. There is no guarding or rebound. Negative signs include Murphy's sign and McBurney's sign.  Musculoskeletal:     Cervical back: Normal range of motion and neck supple.     Right lower leg: No edema.     Left lower leg: No edema.  Skin:    General: Skin is warm and dry.     Findings: No rash.  Neurological:     General: No focal deficit present.     Mental Status: She is alert. Mental status is at baseline.     Motor: No weakness.  Psychiatric:        Mood and Affect: Mood normal.     ED Results / Procedures / Treatments   Labs (all labs ordered are listed, but only abnormal results are displayed) Labs Reviewed  LIPASE, BLOOD - Abnormal; Notable for the following components:      Result Value   Lipase 74 (*)    All other components within normal limits  URINALYSIS, ROUTINE W REFLEX MICROSCOPIC - Abnormal; Notable for the following components:   Leukocytes,Ua SMALL (*)    Bacteria, UA RARE (*)    All other components within normal limits  COMPREHENSIVE METABOLIC PANEL WITH GFR  CBC    EKG None  Radiology CT ABDOMEN PELVIS W CONTRAST Result Date: 11/14/2023 CLINICAL DATA:  Lower abdominal pain, nausea for 1 week, recent colonoscopy and abdominal wall surgery EXAM: CT ABDOMEN AND PELVIS WITH CONTRAST TECHNIQUE: Multidetector CT imaging of the abdomen and pelvis was performed using the standard protocol following bolus administration of intravenous contrast. RADIATION DOSE REDUCTION: This exam was performed according to the departmental dose-optimization program which includes automated exposure  control, adjustment of the mA and/or kV according to patient size and/or use of iterative reconstruction technique. CONTRAST:  OMNIPAQUE IOHEXOL 300 MG/ML  SOLN COMPARISON:  09/09/2020 FINDINGS: Lower chest: No acute pleural or parenchymal lung disease. Hepatobiliary: No focal liver abnormality is seen. No gallstones, gallbladder wall thickening, or biliary dilatation. Pancreas: Unremarkable. No pancreatic ductal dilatation or surrounding inflammatory changes. Spleen: Normal in size without focal abnormality. Adrenals/Urinary Tract: Simple left renal cyst does not require specific imaging follow-up. Otherwise the kidneys enhance normally and symmetrically. The adrenals and bladder are unremarkable. Stomach/Bowel: No bowel obstruction or ileus. Prior appendectomy. Scattered diverticulosis throughout the descending and sigmoid colon, with no evidence of acute diverticulitis. No bowel wall thickening  or inflammatory change. Vascular/Lymphatic: Aortic atherosclerosis. No enlarged abdominal or pelvic lymph nodes. Reproductive: Status post hysterectomy. No adnexal masses. Other: No free fluid or free intraperitoneal gas. Postsurgical changes from recent abdominal plasty. No abdominal wall hernia. Musculoskeletal: No acute or destructive bony abnormalities. Reconstructed images demonstrate no additional findings. IMPRESSION: 1. No acute intra-abdominal or intrapelvic process. 2. Postsurgical changes from recent abdominal wall surgery, with no evidence of postoperative complication. 3. Distal colonic diverticulosis without diverticulitis. 4.  Aortic Atherosclerosis (ICD10-I70.0). Electronically Signed   By: Bobbye Burrow M.D.   On: 11/14/2023 17:56    Procedures Procedures    Medications Ordered in ED Medications  iohexol (OMNIPAQUE) 300 MG/ML solution 100 mL (100 mLs Intravenous Contrast Given 11/14/23 1738)    ED Course/ Medical Decision Making/ A&P    Patient seen and examined. History obtained  directly from patient.   Labs/EKG: Lab work ordered in triage personally reviewed and interpreted including CBC with normal white blood cell count and hemoglobin; CMP with normal kidney function and liver function testing; lipase minimally elevated at 74; UA clean-catch with 11-20 white blood cells and small leuk esterase.  Imaging: Ordered CT abdomen pelvis.  Medications/Fluids: None ordered  Most recent vital signs reviewed and are as follows: BP 104/83 (BP Location: Right Arm)   Pulse 68   Temp 98.4 F (36.9 C) (Oral)   Resp 14   SpO2 100%   Initial impression: Abdominal pain x 8 days  6:15 PM Reassessment performed. Patient appears stable, comfortable.  Imaging personally visualized and interpreted including: CT of the abdomen pelvis, agree no acute findings or signs of obstruction or infection.  Reviewed pertinent lab work and imaging with patient at bedside. Questions answered.   Most current vital signs reviewed and are as follows: BP 104/83 (BP Location: Right Arm)   Pulse 68   Temp 98.4 F (36.9 C) (Oral)   Resp 14   SpO2 100%   Plan: Discharge to home.   Prescriptions written for: Bentyl for waxing and waning abdominal pain, Macrobid for possible UTI given pyuria.  Discussed with patient that her symptoms are not classic for UTI, unclear if this medication will help, but she should follow-up with her doctor for further evaluation.  Other home care instructions discussed: Nida Barrow diet, avoidance of triggers  ED return instructions discussed: The patient was urged to return to the Emergency Department immediately with worsening of current symptoms, worsening abdominal pain, persistent vomiting, blood noted in stools, fever, or any other concerns. The patient verbalized understanding.   Follow-up instructions discussed: Patient encouraged to follow-up with their PCP in 3 days.                                 Medical Decision Making Amount and/or Complexity of Data  Reviewed Labs: ordered. Radiology: ordered.  Risk Prescription drug management.  For this patient's complaint of abdominal pain, the following conditions were considered on the differential diagnosis: gastritis/PUD, enteritis/duodenitis, appendicitis, cholelithiasis/cholecystitis, cholangitis, pancreatitis, ruptured viscus, colitis, diverticulitis, small/large bowel obstruction, proctitis, cystitis, pyelonephritis, ureteral colic, aortic dissection, aortic aneurysm. In women, pelvic inflammatory disease, ovarian cysts, and tubo-ovarian abscess were also considered. Atypical chest etiologies were also considered including ACS, PE, and pneumonia.  Overall workup today is reassuring.  Lipase is slightly elevated, patient notified so that she can have this rechecked.  Otherwise lab work is reassuring.  CT is reassuring.  Questionable UTI.  Given lack of other  potential etiology, will treat to remove this from the possibilities as specimen was clean-catch.  The patient's vital signs, pertinent lab work and imaging were reviewed and interpreted as discussed in the ED course. Hospitalization was considered for further testing, treatments, or serial exams/observation. However as patient is well-appearing, has a stable exam, and reassuring studies today, I do not feel that they warrant admission at this time. This plan was discussed with the patient who verbalizes agreement and comfort with this plan and seems reliable and able to return to the Emergency Department with worsening or changing symptoms.             Final Clinical Impression(s) / ED Diagnoses Final diagnoses:  Generalized abdominal pain  Acute cystitis without hematuria    Rx / DC Orders ED Discharge Orders          Ordered    nitrofurantoin, macrocrystal-monohydrate, (MACROBID) 100 MG capsule  2 times daily        11/14/23 1813    dicyclomine (BENTYL) 20 MG tablet  2 times daily        11/14/23 1813               Lyna Sandhoff, PA-C 11/14/23 1818    Hershel Los, MD 11/14/23 2311

## 2023-11-14 NOTE — ED Triage Notes (Addendum)
 Pt to ED reporting lower left sided abd pain x 1 week with nausea and vomiting 1 week ago on Saturday. Recent constipation last week that patient took mag citrate for. Patient reporting only small bowel movements since. Patient not passing gas.   Colonoscopy done May 15th  Tummy tuck done April 3rd Appendix removed

## 2023-11-14 NOTE — Discharge Instructions (Signed)
 Please read and follow all provided instructions.  Your diagnoses today include:  1. Generalized abdominal pain   2. Acute cystitis without hematuria     Tests performed today include: Complete blood cell count: Normal white blood cell count and red blood cell count Complete metabolic panel: Normal electrolytes and liver function testing Lipase (pancreas function test): Was slightly elevated in the 70s Urinalysis (urine test): Did show white blood cells which could indicate an infection CT scan of your abdomen pelvis did not show any acute or concerning findings which would explain your pain today Vital signs. See below for your results today.   Medications prescribed:  Bentyl - medication for intestinal cramps and spasms  Macrobid - antibiotic for urinary tract infection  You have been prescribed an antibiotic medicine: take the entire course of medicine even if you are feeling better. Stopping early can cause the antibiotic not to work.  Take any prescribed medications only as directed.  Home care instructions:  Follow any educational materials contained in this packet.  Follow-up instructions: Please follow-up with your primary care provider in the next 3 days for further evaluation of your symptoms.    Return instructions:  SEEK IMMEDIATE MEDICAL ATTENTION IF: The pain does not go away or becomes severe  A temperature above 101F develops  Repeated vomiting occurs (multiple episodes)  The pain becomes localized to portions of the abdomen. The right side could possibly be appendicitis. In an adult, the left lower portion of the abdomen could be colitis or diverticulitis.  Blood is being passed in stools or vomit (bright red or black tarry stools)  You develop chest pain, difficulty breathing, dizziness or fainting, or become confused, poorly responsive, or inconsolable (young children) If you have any other emergent concerns regarding your health  Additional  Information: Abdominal (belly) pain can be caused by many things. Your caregiver performed an examination and possibly ordered blood/urine tests and imaging (CT scan, x-rays, ultrasound). Many cases can be observed and treated at home after initial evaluation in the emergency department. Even though you are being discharged home, abdominal pain can be unpredictable. Therefore, you need a repeated exam if your pain does not resolve, returns, or worsens. Most patients with abdominal pain don't have to be admitted to the hospital or have surgery, but serious problems like appendicitis and gallbladder attacks can start out as nonspecific pain. Many abdominal conditions cannot be diagnosed in one visit, so follow-up evaluations are very important.  Your vital signs today were: BP 104/83 (BP Location: Right Arm)   Pulse 68   Temp 98.4 F (36.9 C) (Oral)   Resp 14   SpO2 100%  If your blood pressure (bp) was elevated above 135/85 this visit, please have this repeated by your doctor within one month. --------------

## 2023-11-16 ENCOUNTER — Telehealth: Payer: Self-pay

## 2023-11-16 NOTE — Telephone Encounter (Signed)
 Inbound call from patient, states she is still experiencing extreme abdominal pain. She states she went to the ED over the weekend. And would like to speak to a nurse, in regards to conversation last week.

## 2023-11-16 NOTE — Telephone Encounter (Signed)
 Patient seen in the ER. Started on abt's for possible UTI.  Appointment requested for follow up. Appointment scheduled with the patient.

## 2023-11-16 NOTE — Telephone Encounter (Signed)
 Patient seen in the ER for continuing abdominal pain. She had labs, urinalysis and a CT scan. She  is being treated for a possible UTI. No constipation. She is asking if she can investigate the possibility of this pain being caused by her gallbladder or pancreas. Appointment scheduled for the patient to be seen 12/04/23. Please advise.

## 2023-11-17 NOTE — Telephone Encounter (Signed)
 Noted

## 2023-11-23 ENCOUNTER — Telehealth: Payer: Self-pay | Admitting: Internal Medicine

## 2023-11-23 NOTE — Telephone Encounter (Signed)
 Patient is coming home from [place]  because she feels bad. Every time I eat it makes my stomach hurt. I feel nauseated. Accepts an appointment Friday 11/27/23 for evaluation with Maranda Senegal, PA.

## 2023-11-23 NOTE — Telephone Encounter (Signed)
 Patient called and stated that she is having severe abdominal pain. Patient is requesting a call back. Please advise.

## 2023-11-27 ENCOUNTER — Ambulatory Visit: Admitting: Gastroenterology

## 2023-11-27 ENCOUNTER — Other Ambulatory Visit

## 2023-11-27 ENCOUNTER — Encounter: Payer: Self-pay | Admitting: Gastroenterology

## 2023-11-27 ENCOUNTER — Ambulatory Visit
Admission: RE | Admit: 2023-11-27 | Discharge: 2023-11-27 | Disposition: A | Discharge status: Home / Self Care | Source: Ambulatory Visit | Attending: Gastroenterology | Admitting: Gastroenterology

## 2023-11-27 ENCOUNTER — Ambulatory Visit: Payer: Self-pay | Admitting: Gastroenterology

## 2023-11-27 VITALS — BP 100/74 | HR 68 | Ht 65.0 in | Wt 128.2 lb

## 2023-11-27 DIAGNOSIS — K2970 Gastritis, unspecified, without bleeding: Secondary | ICD-10-CM

## 2023-11-27 DIAGNOSIS — R1084 Generalized abdominal pain: Secondary | ICD-10-CM | POA: Diagnosis present

## 2023-11-27 DIAGNOSIS — R1013 Epigastric pain: Secondary | ICD-10-CM

## 2023-11-27 DIAGNOSIS — K5909 Other constipation: Secondary | ICD-10-CM

## 2023-11-27 DIAGNOSIS — R1032 Left lower quadrant pain: Secondary | ICD-10-CM

## 2023-11-27 DIAGNOSIS — Z8719 Personal history of other diseases of the digestive system: Secondary | ICD-10-CM

## 2023-11-27 DIAGNOSIS — R112 Nausea with vomiting, unspecified: Secondary | ICD-10-CM

## 2023-11-27 LAB — CBC WITH DIFFERENTIAL/PLATELET
Basophils Absolute: 0.1 10*3/uL (ref 0.0–0.1)
Basophils Relative: 1.3 % (ref 0.0–3.0)
Eosinophils Absolute: 0.1 10*3/uL (ref 0.0–0.7)
Eosinophils Relative: 1.2 % (ref 0.0–5.0)
HCT: 43.5 % (ref 36.0–46.0)
Hemoglobin: 14.6 g/dL (ref 12.0–15.0)
Lymphocytes Relative: 37.1 % (ref 12.0–46.0)
Lymphs Abs: 2.8 10*3/uL (ref 0.7–4.0)
MCHC: 33.5 g/dL (ref 30.0–36.0)
MCV: 95.7 fl (ref 78.0–100.0)
Monocytes Absolute: 0.6 10*3/uL (ref 0.1–1.0)
Monocytes Relative: 7.6 % (ref 3.0–12.0)
Neutro Abs: 4 10*3/uL (ref 1.4–7.7)
Neutrophils Relative %: 52.8 % (ref 43.0–77.0)
Platelets: 256 10*3/uL (ref 150.0–400.0)
RBC: 4.55 Mil/uL (ref 3.87–5.11)
RDW: 13.1 % (ref 11.5–15.5)
WBC: 7.6 10*3/uL (ref 4.0–10.5)

## 2023-11-27 LAB — COMPREHENSIVE METABOLIC PANEL WITH GFR
ALT: 23 U/L (ref 0–35)
AST: 20 U/L (ref 0–37)
Albumin: 4.5 g/dL (ref 3.5–5.2)
Alkaline Phosphatase: 55 U/L (ref 39–117)
BUN: 14 mg/dL (ref 6–23)
CO2: 29 meq/L (ref 19–32)
Calcium: 9.3 mg/dL (ref 8.4–10.5)
Chloride: 103 meq/L (ref 96–112)
Creatinine, Ser: 0.78 mg/dL (ref 0.40–1.20)
GFR: 83.66 mL/min (ref >60.00)
Glucose, Bld: 82 mg/dL (ref 70–99)
Potassium: 4.1 meq/L (ref 3.5–5.1)
Sodium: 140 meq/L (ref 135–145)
Total Bilirubin: 0.4 mg/dL (ref 0.2–1.2)
Total Protein: 7 g/dL (ref 6.0–8.3)

## 2023-11-27 LAB — LIPASE: Lipase: 44 U/L (ref 11.0–59.0)

## 2023-11-27 LAB — TSH: TSH: 2.38 u[IU]/mL (ref 0.35–5.50)

## 2023-11-27 MED ORDER — OMEPRAZOLE 40 MG PO CPDR: 40.0000 mg | DELAYED_RELEASE_CAPSULE | Freq: Two times a day (BID) | ORAL | 3 refills | Status: DC

## 2023-11-27 MED ORDER — SUCRALFATE 1 G PO TABS: 1.0000 g | ORAL_TABLET | Freq: Two times a day (BID) | ORAL | 1 refills | Status: DC

## 2023-11-27 MED ORDER — IOHEXOL 350 MG/ML SOLN
100.0000 mL | Freq: Once | INTRAVENOUS | Status: AC | PRN
  Administered 2023-11-27: 100 mL via INTRAVENOUS

## 2023-11-27 NOTE — Progress Notes (Signed)
 Nichole Manning [id number] 08/27/1965   Chief Complaint: Abdominal pain  Referring Provider: Sharyne Degree, FNP Primary GI MD: Dr. Willy Harvest  HPI: Nichole Manning is a 58 y.o. female with past medical history of HLD, hypothyroidism, constipation, ILD, PVCs, anxiety, fibroids, appendectomy, tubal ligation who presents today for a complaint of abdominal pain and nausea.    Patient seen in office 07/10/2023 by Dr. Willy Harvest for burning chest pain/burning esophageal pain, nausea, and fatigue.  She was put on omeprazole  40 mg daily and scheduled for EGD as well as colonoscopy for colon cancer screening.  Noted to have had a negative autoimmune workup as part of ILD evaluation.  Was on Ozempic.  On Linzess for chronic constipation.  She underwent EGD and colonoscopy 10/28/2023, with finding of gastritis which was negative for H. Pylori, and a hyperplastic polyp and diverticulosis on colonoscopy.  11/11/2023 Patient called complaining of constipation.  She had resumed her Ozempic, and reported no bowel movement for 4 to 5 days.  Had associated LLQ abdominal pain.  Had been taking Linzess daily.  Drank magnesium citrate and passed liquid stool.  She was concerned about a blockage.  Dr. Willy Harvest recommended stopping the Ozempic.  11/14/2023 patient seen in the ED for complaint of 8 days of abdominal pain, nausea, and vomiting.  CT of the abdomen and pelvis with no acute findings.  Noted to have pyuria on urinalysis and was prescribed Macrobid  for possible UTI, though symptoms not classic for this. Lipase 74.  Also given Bentyl  for abdominal pain.  11/16/2023 patient called stating she was still experiencing extreme abdominal pain.    Patient called 11/23/2023 stating every time she ate it caused stomach pain and nausea.   Patient here today with her ex-husband.  States that she had a tummy tuck procedure on 4/3, colonoscopy 5/21, and was feeling well until 5/30, when she woke up with severe nausea that did  not improve with Zofran  or Phenergan.  She vomited bile once that weekend.  Had LLQ abdominal pain which was constant.  She did not feel constipated.  She went to the ED for evaluation with course as previously stated.  She had noticed her urine felt hot but otherwise was not having any urinary symptoms consistent with UTI.  She did not notice any improvement after taking Macrobid .  She is no longer feeling nauseated but she does continue to have constant abdominal pain which is primarily located to the LLQ, but has also had epigastric pain which can radiate to her back.  She is on omeprazole  40 mg daily but has been having some intermittent burning epigastric/esophageal pain.  States that abdominal pain feels like labor pains, cramping/burning in nature.  Feels similar to when she had appendicitis and has been waking her up at night.  Pain is there at a low level constantly, but becomes severe after eating.  Does not matter what she eats.  She does force herself to eat but is eating less.  Weight is stable.  She stopped Ozempic and has not restarted at the recommendation of both her PCP and Dr. Willy Harvest.  She is on Linzess which she says does help with constipation, but her bowel movements are inconsistent.  Some days she does not have a bowel movement, but most days she does pass stool.  Stools are often loose and can be watery.  Occasionally may have a sensation of incomplete evacuation.  Denies any blood in her stool.  She has known diverticulosis but  denies any episodes of diverticulitis.  She denies fever or chills.  She has been checking her temperature at home.  Previous GI Procedures/Imaging   CT A/P 11/14/2023 IMPRESSION: 1. No acute intra-abdominal or intrapelvic process. 2. Postsurgical changes from recent abdominal wall surgery, with no evidence of postoperative complication. 3. Distal colonic diverticulosis without diverticulitis. 4.  Aortic Atherosclerosis  (ICD10-I70.0).  Colonoscopy 10/28/2023 - One diminutive polyp in the transverse colon, removed with a cold snare. Resected and retrieved.  - Diverticulosis in the sigmoid colon.  - The examination was otherwise normal on direct and retroflexion views. Path: 2. Surgical [P], colon, transverse, polyp (1) :       - HYPERPLASTIC POLYP.       - NO DYSPLASIA OR MALIGNANCY.   EGD 10/28/2023 - Gastritis. Biopsied.  - The examination was otherwise normal. No cause of heartburn or odynophagia seen. She reports significant symptom improveement after taking 1 month of omeprazole  40 mg daily which was prescribed in 1/ 2025 ( and stopped after 1 month of treatment) Path: 1. Surgical [P], gastric antrum :       - MILD REACTIVE GASTROPATHY.       - NEGATIVE FOR H. PYLORI ON H&E STAIN       - NO INTESTINAL METAPLASIA, DYSPLASIA, OR MALIGNANCY.   Past Medical History:  Diagnosis Date   Anxiety    Constipation    Fibroids    Hyperlipidemia    Hypothyroidism    ILD (interstitial lung disease) (HCC)    Palpitations    PVC's (premature ventricular contractions)    SVD (spontaneous vaginal delivery)    x 3    Past Surgical History:  Procedure Laterality Date   ancillary surgery Bilateral    09/09/23 fat removal   APPENDECTOMY     laparoscopic   AUGMENTATION MAMMAPLASTY     BILATERAL SALPINGECTOMY Bilateral 07/01/2013   Procedure: BILATERAL SALPINGECTOMY;  Surgeon: Camillo Celestine, MD;  Location: [facility];  Service: Gynecology;  Laterality: Bilateral;   BREAST SURGERY     breast reduction x 2   LAPAROSCOPIC APPENDECTOMY N/A 09/09/2020   Procedure: APPENDECTOMY LAPAROSCOPIC;  Surgeon: Kinsinger, Alphonso Aschoff, MD;  Location: [facility];  Service: General;  Laterality: N/A;   TUBAL LIGATION     Tummy tuck     09/09/23   WISDOM TOOTH EXTRACTION      Current Outpatient Medications  Medication Sig Dispense Refill   Clindamycin-Benzoyl Per, Refr, gel Apply 1 application topically daily.     cyanocobalamin  (,VITAMIN B-12,) 1000 MCG/ML injection Inject 1,000 mcg into the muscle every 28 (twenty-eight) days.     dicyclomine  (BENTYL ) 20 MG tablet Take 1 tablet (20 mg total) by mouth 2 (two) times daily. 20 tablet 0   Erenumab -aooe (AIMOVIG ) 140 MG/ML SOAJ Inject 140 mg into the skin every 28 (twenty-eight) days. 1.12 mL 5   estradiol (ESTRACE) 0.5 MG tablet Take 0.5 mg by mouth daily.     hydrOXYzine (VISTARIL) 25 MG capsule Take 25-50 mg by mouth at bedtime as needed.     levothyroxine  (SYNTHROID ) 88 MCG tablet Take 88 mcg by mouth daily.     linaclotide (LINZESS) 145 MCG CAPS capsule Take 145 mg by mouth daily.     minoxidil (LONITEN) 2.5 MG tablet Take 2.5 mg by mouth daily.     Multiple Vitamin (MULTIVITAMIN WITH MINERALS) TABS tablet Take 1 tablet by mouth daily.     nitrofurantoin , macrocrystal-monohydrate, (MACROBID ) 100 MG capsule Take 1 capsule (100 mg  total) by mouth 2 (two) times daily. 10 capsule 0   OZEMPIC, 0.25 OR 0.5 MG/DOSE, 2 MG/3ML SOPN Inject 0.25 mg as directed once a week.     progesterone (PROMETRIUM) 200 MG capsule Take 200 mg by mouth at bedtime. PRN     promethazine (PHENERGAN) 12.5 MG tablet Take 12.5 mg by mouth every 4 (four) hours as needed.     propranolol  ER (INDERAL  LA) 60 MG 24 hr capsule TAKE 1 CAPSULE BY MOUTH EVERY DAY 90 capsule 2   rosuvastatin  (CRESTOR ) 5 MG tablet Take 1 tablet (5 mg total) by mouth daily. 90 tablet 3   SPIRIVA RESPIMAT 2.5 MCG/ACT AERS Inhale 2 puffs into the lungs daily at 6 (six) AM.     SYMBICORT 160-4.5 MCG/ACT inhaler Inhale 2 puffs into the lungs as needed.     Tretinoin (ALTRENO) 0.05 % LOTN Apply 0.05 % topically.     venlafaxine XR (EFFEXOR-XR) 37.5 MG 24 hr capsule Take 37.5 mg by mouth at bedtime.     vitamin C (ASCORBIC ACID) 500 MG tablet Take 500 mg by mouth daily.     No current facility-administered medications for this visit.    Allergies as of 11/27/2023 - Review Complete 11/14/2023  Allergen Reaction Noted   Sulfa  antibiotics Hives 06/20/2013   Adhesive [tape] Itching 06/20/2013   Amoxicillin Nausea Only 01/29/2023   Codeine Nausea And Vomiting 06/20/2013    Family History  Adopted: Yes  Problem Relation Age of Onset   Breast cancer Mother 62   Cancer Father        bile duct cancer died in his 9's   Colon polyps Neg Hx    Colon cancer Neg Hx    Esophageal cancer Neg Hx    Stomach cancer Neg Hx    Rectal cancer Neg Hx     Social History   Tobacco Use   Smoking status: Former    Current packs/day: 1.50    Average packs/day: 1.5 packs/day for 30.0 years (45.0 ttl pk-yrs)    Types: Cigarettes   Smokeless tobacco: Never   Tobacco comments:    20 cigarettes per day 11/16/19 [facility]   Vaping Use   Vaping status: Some Days  Substance Use Topics   Alcohol use: Yes    Comment: social   Drug use: No     Review of Systems:    Constitutional: No weight loss, fever, chills, weakness  Skin: No rash or itching Cardiovascular: No chest pain, chest pressure or palpitations   Respiratory: No SOB or cough Gastrointestinal: See HPI and otherwise negative Genitourinary: No dysuria or change in urinary frequency Neurological: No headache, dizziness or syncope Musculoskeletal: No new muscle or joint pain Hematologic: No bleeding or bruising    Physical Exam:  Vital signs: BP 100/74   Pulse 68   Ht 5' 5 (1.651 m)   Wt 128 lb 4 oz (58.2 kg)   BMI 21.34 kg/m    Wt Readings from Last 3 Encounters:  11/27/23 128 lb 4 oz (58.2 kg)  11/09/23 129 lb (58.5 kg)  10/28/23 125 lb (56.7 kg)     Constitutional: NAD, Well developed, Well nourished, alert and cooperative Head:  Normocephalic and atraumatic.  Eyes: No scleral icterus. Conjunctiva pink. Mouth: No oral lesions. Respiratory: Respirations even and unlabored. Lungs clear to auscultation bilaterally.  No wheezes, crackles, or rhonchi.  Cardiovascular:  Regular rate and rhythm. No murmurs. No peripheral edema. Gastrointestinal:  Soft,  nondistended. Tender to palpation of  central abdomen and LLQ. No rebound or guarding. Normal bowel sounds. No appreciable masses or hepatomegaly. Rectal:  Not performed.  Neurologic:  Alert and oriented x4;  grossly normal neurologically.  Skin:   Dry and intact without significant lesions or rashes. Psychiatric: Oriented to person, place and time. Demonstrates good judgement and reason without abnormal affect or behaviors.   RELEVANT LABS AND IMAGING: CBC    Component Value Date/Time   WBC 8.4 11/14/2023 1655   RBC 4.00 11/14/2023 1655   HGB 13.2 11/14/2023 1655   HGB 13.6 03/02/2023 1432   HCT 39.6 11/14/2023 1655   HCT 40.9 03/02/2023 1432   PLT 227 11/14/2023 1655   PLT 270 03/02/2023 1432   MCV 99.0 11/14/2023 1655   MCV 97 03/02/2023 1432   MCH 33.0 11/14/2023 1655   MCHC 33.3 11/14/2023 1655   RDW 12.2 11/14/2023 1655   RDW 12.3 03/02/2023 1432   LYMPHSABS 2.3 10/10/2022 1238   LYMPHSABS 3.7 (H) 10/10/2019 1600   MONOABS 0.4 10/10/2022 1238   EOSABS 0.1 10/10/2022 1238   EOSABS 0.1 10/10/2019 1600   BASOSABS 0.1 10/10/2022 1238   BASOSABS 0.1 10/10/2019 1600    CMP     Component Value Date/Time   NA 140 11/14/2023 1655   NA 140 03/02/2023 1432   K 3.9 11/14/2023 1655   CL 104 11/14/2023 1655   CO2 25 11/14/2023 1655   GLUCOSE 94 11/14/2023 1655   BUN 14 11/14/2023 1655   BUN 24 03/02/2023 1432   CREATININE 0.83 11/14/2023 1655   CALCIUM  9.4 11/14/2023 1655   PROT 6.7 11/14/2023 1655   PROT 6.7 08/21/2023 1554   ALBUMIN  4.3 11/14/2023 1655   ALBUMIN  4.4 08/21/2023 1554   AST 20 11/14/2023 1655   ALT 21 11/14/2023 1655   ALKPHOS 66 11/14/2023 1655   BILITOT 0.2 11/14/2023 1655   BILITOT 0.4 08/21/2023 1554   GFRNONAA >60 11/14/2023 1655   GFRAA 85 10/10/2019 1600     Assessment/Plan:   Postprandial abdominal pain Nausea/vomiting Chronic constipation Dyspepsia Gastritis  Patient with persistent abdominal pain since 11/06/2023.  Pain primarily  located to the LLQ, but has also had epigastric pain which can radiate to her back.  Pain is constant but becomes severe after eating.  Reports she is eating less but weight is stable. She has stopped Ozempic. She is on omeprazole  40 mg daily but has been having some intermittent burning epigastric/esophageal pain. On Linzess which helps some with constipation, bowel movements have been fairly loose. 11/14/2023 had evaluation in the ED. CT A/P with no acute findings.  Noted to have pyuria on urinalysis and was prescribed Macrobid  for possible UTI, though symptoms not classic for this she had no improvement in abdominal pain with treatment. Lipase mildly elevated to 74.   Pain not improved with Bentyl .  - Will order CTA to rule out mesenteric ischemia/intestinal angina given persistent postprandial abdominal pain. She has significant smoking history. - Check labs: CBC, CMP, TSH, lipase - Increase omeprazole  to 40mg  BID - Add on Carafate 1g BID for 4-8 weeks - Continue to hold Ozempic - Continue Linzess 145mcg    Nichole Gaul, PA-C [facility] 11/27/2023, 8:16 AM  Patient Care Team: Sharyne Degree, FNP as PCP - General (Family Medicine) Sheryle Donning, MD as PCP - Cardiology (Cardiology) Merriam Abbey, DO as Consulting Physician (Neurology)

## 2023-11-27 NOTE — Patient Instructions (Addendum)
 _______________________________________________________  If your blood pressure at your visit was 140/90 or greater, please contact your primary care physician to follow up on this.  _______________________________________________________  If you are age 60 or older, your body mass index should be between 23-30. Your Body mass index is 21.34 kg/m. If this is out of the aforementioned range listed, please consider follow up with your Primary Care Provider.  If you are age 35 or younger, your body mass index should be between 19-25. Your Body mass index is 21.34 kg/m. If this is out of the aformentioned range listed, please consider follow up with your Primary Care Provider.   ________________________________________________________  The  GI providers would like to encourage you to use MYCHART to communicate with providers for non-urgent requests or questions.  Due to long hold times on the telephone, sending your provider a message by [facility] may be a faster and more efficient way to get a response.  Please allow 48 business hours for a response.  Please remember that this is for non-urgent requests.  _______________________________________________________  Your provider has requested that you go to the basement level for lab work before leaving today. Press B on the elevator. The lab is located at the first door on the left as you exit the elevator.  We have sent the following medications to your pharmacy for you to pick up at your convenience: Omeprazole  40mg  2 times a day Carafate 1 g 2 times a day  Continue to hold your ozempic. Continue linzess  You have been scheduled for an appointment with Valiant Gaul PA-C on 01-11-24 at 9:40am . Please arrive 10 minutes early for your appointment.   Scheduled for CT Angio on 11-27-23 at 4:30pm at [facility]  Address: [address] [place], [place] [address] Please call [phone] to reschedule if you need to  It was a  pleasure to see you today!  Thank you for trusting me with your gastrointestinal care!

## 2023-12-01 ENCOUNTER — Ambulatory Visit: Admitting: Pulmonary Disease

## 2023-12-01 ENCOUNTER — Telehealth: Admitting: Pulmonary Disease

## 2023-12-01 DIAGNOSIS — R5382 Chronic fatigue, unspecified: Secondary | ICD-10-CM | POA: Diagnosis not present

## 2023-12-01 DIAGNOSIS — J849 Interstitial pulmonary disease, unspecified: Secondary | ICD-10-CM

## 2023-12-01 DIAGNOSIS — Z87891 Personal history of nicotine dependence: Secondary | ICD-10-CM | POA: Diagnosis not present

## 2023-12-01 NOTE — Progress Notes (Signed)
 Nichole Manning    [id number]    [birth year]  Primary Care Physician:[place], Channing SQUIBB, FNP  Referring Physician: Donal Channing SQUIBB, FNP [address] [place],  [place] [address]  Virtual Visit via Video Note  I connected with Nichole Manning on 12/01/23 at  2:00 PM EDT by a video enabled telemedicine application and verified that I am speaking with the correct person using two identifiers.  Location: Patient: Home Provider: Office  Chief complaint:  Follow-up for interstitial lung disease  HPI: 58 y.o. who  has a past medical history of Anxiety, Constipation, Fibroids, Hyperlipidemia, Hypothyroidism, ILD (interstitial lung disease) (HCC), Palpitations, PVC's (premature ventricular contractions), and SVD (spontaneous vaginal delivery).   Referred for evaluation of COPD and dyspnea with abnormal CT scan. She had history of COVID infection in June 2020 and was evaluated in the pulmonary office with cough, shortness of breath, brain fog and memory loss.  Had a CT at that time showed subtle basal changes.  PFTs were ordered as follow-up but she did not go through with the test  Now referred back with increasing dyspnea on exertion.  She has occasional cough.  No mucus production or wheezing.  She had a follow-up high-res CT recently which showed increased groundglass opacities at the base.  No current exposure to down pillows or comforters, which she previously used. She has been tested for autoimmune diseases like lupus and rheumatoid arthritis, with negative results. She has a history of hyperthyroidism, but it is noted that this condition does not typically cause interstitial lung disease.  Pets: Dog Occupation: Retired.  Used to work in legal Exposures: She has a feather pillow on her living room so far.  No mold, hot tub, Jacuzzi. Smoking history: 45-pack-year smoker.  Quit in 2021 and started vaping Travel history: No significant travel history Relevant family  history: No family history of lung disease  Interim History Discussed the use of AI scribe software for clinical note transcription with the patient, who gave verbal consent to proceed.  History of Present Illness Nichole Manning is a 58 year old female with a history of lung conditions who presents with concerns about exposure to bat droppings.  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 The damage to the rafters and significant accumulation of droppings were noted, with a cleanup estimate of $21,000, which she finds prohibitive. Some areas have been cleaned, but complete removal is challenging due to the bats being in hard-to-reach areas.  She is concerned about potential health effects from the exposure, particularly given her existing lung condition. She feels tired but denies fever or other symptoms commonly associated with fungal infections from bat droppings, such as histoplasmosis.  Her past medical history includes a lung condition with previous CT scans showing inflammatory changes. She is not currently using her inhaler. She lives in a large house but is considering moving as her children are grown.   Outpatient Encounter Medications as of 12/01/2023  Medication Sig   Clindamycin-Benzoyl Per, Refr, gel Apply 1 application topically daily.   cyanocobalamin (,VITAMIN B-12,) 1000 MCG/ML injection Inject 1,000 mcg into the muscle every 28 (twenty-eight) days.   dicyclomine  (BENTYL ) 20 MG tablet Take 1 tablet (20 mg total) by mouth 2 (two) times daily.   Erenumab -aooe (AIMOVIG ) 140 MG/ML SOAJ Inject 140 mg into the skin every 28 (twenty-eight) days.   estradiol (  ESTRACE) 0.5 MG tablet Take 0.5 mg by mouth daily.   hydrOXYzine (VISTARIL) 25 MG capsule Take 25-50 mg by mouth at bedtime as needed.   levothyroxine  (SYNTHROID ) 88 MCG tablet Take 88 mcg by mouth daily.   linaclotide (LINZESS) 145  MCG CAPS capsule Take 145 mg by mouth daily.   minoxidil (LONITEN) 2.5 MG tablet Take 2.5 mg by mouth daily.   Multiple Vitamin (MULTIVITAMIN WITH MINERALS) TABS tablet Take 1 tablet by mouth daily.   nitrofurantoin , macrocrystal-monohydrate, (MACROBID ) 100 MG capsule Take 1 capsule (100 mg total) by mouth 2 (two) times daily.   omeprazole  (PRILOSEC) 40 MG capsule Take 1 capsule (40 mg total) by mouth 2 (two) times daily.   OZEMPIC, 0.25 OR 0.5 MG/DOSE, 2 MG/3ML SOPN Inject 0.25 mg as directed once a week.   progesterone (PROMETRIUM) 200 MG capsule Take 200 mg by mouth at bedtime. PRN   promethazine (PHENERGAN) 12.5 MG tablet Take 12.5 mg by mouth every 4 (four) hours as needed.   propranolol  ER (INDERAL  LA) 60 MG 24 hr capsule TAKE 1 CAPSULE BY MOUTH EVERY DAY   rosuvastatin  (CRESTOR ) 5 MG tablet Take 1 tablet (5 mg total) by mouth daily.   SPIRIVA RESPIMAT 2.5 MCG/ACT AERS Inhale 2 puffs into the lungs daily at 6 (six) AM.   sucralfate  (CARAFATE ) 1 g tablet Take 1 tablet (1 g total) by mouth 2 (two) times daily.   SYMBICORT 160-4.5 MCG/ACT inhaler Inhale 2 puffs into the lungs as needed.   Tretinoin (ALTRENO) 0.05 % LOTN Apply 0.05 % topically.   venlafaxine XR (EFFEXOR-XR) 37.5 MG 24 hr capsule Take 37.5 mg by mouth at bedtime.   vitamin C (ASCORBIC ACID) 500 MG tablet Take 500 mg by mouth daily.   No facility-administered encounter medications on file as of 12/01/2023.   Physical Exam: Tele  Data Reviewed: Imaging: High-res CT 12/09/2019-subtle changes in the lung base bilaterally, mild diffuse bronchial wall thickening with mild emphysema High-res CT 12/02/2022-peripheral predominant groundglass attenuation and mild septal thickening.  Indeterminate pattern. High resolution CT 08/07/2023-previously discussed chronic groundglass opacities have resolved, mild emphysematous changes, 4 mm left lower lobe pulmonary nodule, small curvilinear opacity in the lower lobes. I have reviewed the images  personally.  PFTs: 04/16/2023 FVC 2.70 [77%], FEV1 2.14 [78%], F/F79, TLC 4.53 [87%], DLCO 11.70 [55%] Moderate diffusion impairment  Labs: CBC 10/10/2022-WBC 6.3, eos 1%, absolute eosinophil count 63 CTD serologies 01/28/2023-, CK, Jo 1, RNP, hypersensitive panel, Sjogren's antibody, aldolase-negative  Echocardiogram Cardiac 07/18/2022-LVEF 60-65%, normal RV systolic size and function.  Trivial TR.  Assessment & Plan Mild emphysema Mild emphysema identified on CT scan with normal lung function and no evidence of COPD. She reports not using her inhaler. - Discontinue Spiriva if not needed.  Interstitial lung disease Previous CT scan showed inflammatory changes, but follow-up scans, including a high-resolution scan in Feb 2025, show improvement with no residual evidence of interstitial lung disease.  No clear etiology identified. Autoimmune workup negative. No significant acid reflux. Vaping and prior COVID-19 infection considered as potential contributing factors.  She has minimal exposure to down pillows but she has gotten rid of it since prior visit.   Current CT scan shows no signs of histoplasmosis, lymphadenopathy, or granuloma. She was exposed to bat droppings, which can cause histoplasmosis, but no current symptoms or CT evidence of infection.  - Order blood test for histoplasma antibodies. - Advise to avoid continued exposure to bat droppings. - Recommend cleaning of bat droppings from home. -  Consider relocating if exposure continues. - Advised to quit vaping   Plan/Recommendations: Histoplasma antibodies Exposure avoidance, quit vaping  I discussed the assessment and treatment plan with the patient. The patient was provided an opportunity to ask questions and all were answered. The patient agreed with the plan and demonstrated an understanding of the instructions.   The patient was advised to call back or seek an in-person evaluation if the symptoms worsen or if the condition  fails to improve as anticipated.  Lonna Coder MD  Pulmonary and Critical Care 12/01/2023, 12:28 PM  CC: Donal Channing SQUIBB, FNP

## 2023-12-03 ENCOUNTER — Telehealth: Payer: Self-pay

## 2023-12-03 NOTE — Telephone Encounter (Signed)
 Copied from CRM [id number]. Topic: Appointments - Scheduling Inquiry for Clinic >> Dec 03, 2023  3:40 PM Celestine FALCON wrote: Reason for CRM: Pt is requesting to schedule an appt for a lab order for tomorrow. Pt's order is for Histoplasma Antibodies. Pt sees Dr. Theophilus. Please call the pt back at [phone] ok to leave a vm. Pt is going to be out of town all of next week. Pt would prefer to have an appt tomorrow.  Lm for patient.

## 2023-12-03 NOTE — Addendum Note (Signed)
 Addended by: CLAUDENE NEVINS A on: 12/03/2023 04:09 PM   Modules accepted: Orders

## 2023-12-03 NOTE — Telephone Encounter (Signed)
 Ret your call. Please CB.

## 2023-12-04 ENCOUNTER — Ambulatory Visit: Admitting: Internal Medicine

## 2023-12-04 NOTE — Telephone Encounter (Signed)
 Spoke to patient.  She states that she will on vacation next week. She will call back to schedule lab appt when she returns from vacation.  Nothing further needed.

## 2023-12-07 ENCOUNTER — Other Ambulatory Visit

## 2023-12-07 DIAGNOSIS — R5382 Chronic fatigue, unspecified: Secondary | ICD-10-CM

## 2023-12-07 DIAGNOSIS — J849 Interstitial pulmonary disease, unspecified: Secondary | ICD-10-CM

## 2023-12-10 ENCOUNTER — Telehealth: Payer: Self-pay | Admitting: Gastroenterology

## 2023-12-10 DIAGNOSIS — R197 Diarrhea, unspecified: Secondary | ICD-10-CM

## 2023-12-10 NOTE — Telephone Encounter (Signed)
 Inbound call from patient stating she is having severe abdominal pain. States she was seen with her PCP on Monday due to also having blood in her urine. States she tested positive for E Coli. Patient requesting a call back to discuss further. Please advise, thank you.

## 2023-12-10 NOTE — Telephone Encounter (Signed)
 The patient has been notified of this information and all questions answered.

## 2023-12-10 NOTE — Telephone Encounter (Signed)
 Spoke with patient. She reports that on Monday she went to her PCP because of blood in her urine. Her PCP ran UA and CX. She reports that she received a text message yesterday telling her she had E Coli in her urine. Patient is wondering if this could have any correlation to the abdominal pain she has been experiencing. She also reports that the provider put her on Nitrofurantoin  on on Monday, and she didn't change her prescription when the E Coli was discovered. She is concerned that this ABT will not treat the UTI, since it had no effects the last time she took it in June.

## 2023-12-11 LAB — HISTOPLASMA ANTIBODIES
H Band: NEGATIVE
M Band: NEGATIVE

## 2023-12-15 ENCOUNTER — Ambulatory Visit: Payer: Self-pay | Admitting: Pulmonary Disease

## 2023-12-19 ENCOUNTER — Other Ambulatory Visit: Payer: Self-pay | Admitting: Gastroenterology

## 2023-12-21 NOTE — Telephone Encounter (Signed)
 Inbound call from patient requesting f/u call to possibly schedule for cdiff testing. Patient states she is having major abdominal pain. Please advise.   Thank you

## 2023-12-21 NOTE — Telephone Encounter (Signed)
 The pt would like to know if she can have stool testing for C diff.  She states she has significant abd pain with loose stools.  She says she is going multiple times daily.  She had recent Ecoli urinary tract infection and was on abx.  Please advise

## 2023-12-21 NOTE — Addendum Note (Signed)
 Addended by: ANITRA ODETTA CROME on: 12/21/2023 01:37 PM   Modules accepted: Orders

## 2023-12-21 NOTE — Telephone Encounter (Signed)
 The order has been entered and pt aware to come in at her convenience

## 2023-12-22 ENCOUNTER — Other Ambulatory Visit

## 2023-12-23 ENCOUNTER — Other Ambulatory Visit

## 2023-12-23 DIAGNOSIS — R197 Diarrhea, unspecified: Secondary | ICD-10-CM

## 2023-12-24 ENCOUNTER — Encounter (HOSPITAL_COMMUNITY)
Admission: RE | Admit: 2023-12-24 | Discharge: 2023-12-24 | Disposition: A | Discharge status: Home / Self Care | Source: Ambulatory Visit | Attending: Gastroenterology | Admitting: Gastroenterology

## 2023-12-24 ENCOUNTER — Encounter (HOSPITAL_COMMUNITY): Payer: Self-pay

## 2023-12-24 DIAGNOSIS — R1084 Generalized abdominal pain: Secondary | ICD-10-CM | POA: Diagnosis present

## 2023-12-24 LAB — CLOSTRIDIUM DIFFICILE BY PCR: Toxigenic C. Difficile by PCR: NEGATIVE

## 2023-12-24 MED ORDER — TECHNETIUM TC 99M MEBROFENIN IV KIT
5.0000 | PACK | Freq: Once | INTRAVENOUS | Status: AC | PRN
  Administered 2023-12-24: 5.09 via INTRAVENOUS

## 2023-12-25 ENCOUNTER — Telehealth: Payer: Self-pay | Admitting: Pharmacy Technician

## 2023-12-25 ENCOUNTER — Ambulatory Visit: Payer: Self-pay | Admitting: Gastroenterology

## 2023-12-25 ENCOUNTER — Other Ambulatory Visit (HOSPITAL_COMMUNITY): Payer: Self-pay

## 2023-12-25 NOTE — Telephone Encounter (Signed)
 Pharmacy Patient Advocate Encounter   Received notification from CoverMyMeds that prior authorization for AIMOVIG  140MG  is required/requested.   Insurance verification completed.   The patient is insured through [facility] MEDICAID .   Per test claim: PA required; PA submitted to above mentioned insurance via CoverMyMeds Key/confirmation #/EOC [id number] Status is pending

## 2023-12-30 NOTE — Telephone Encounter (Signed)
 Pharmacy Patient Advocate Encounter  Received notification from [employer] that Prior Authorization for Aimovig  140MG /ML auto-injectors has been APPROVED from 12-25-2023 to 03-26-2024   PA #/Case ID/Reference #: [id number]

## 2023-12-31 ENCOUNTER — Emergency Department (HOSPITAL_BASED_OUTPATIENT_CLINIC_OR_DEPARTMENT_OTHER)

## 2023-12-31 ENCOUNTER — Emergency Department (HOSPITAL_BASED_OUTPATIENT_CLINIC_OR_DEPARTMENT_OTHER): Admission: EM | Admit: 2023-12-31 | Discharge: 2023-12-31 | Disposition: A | Discharge status: Home / Self Care

## 2023-12-31 ENCOUNTER — Encounter (HOSPITAL_BASED_OUTPATIENT_CLINIC_OR_DEPARTMENT_OTHER): Payer: Self-pay

## 2023-12-31 ENCOUNTER — Other Ambulatory Visit: Payer: Self-pay

## 2023-12-31 DIAGNOSIS — Z79890 Hormone replacement therapy (postmenopausal): Secondary | ICD-10-CM | POA: Insufficient documentation

## 2023-12-31 DIAGNOSIS — K8020 Calculus of gallbladder without cholecystitis without obstruction: Secondary | ICD-10-CM | POA: Diagnosis not present

## 2023-12-31 DIAGNOSIS — E039 Hypothyroidism, unspecified: Secondary | ICD-10-CM | POA: Diagnosis not present

## 2023-12-31 DIAGNOSIS — R1032 Left lower quadrant pain: Secondary | ICD-10-CM | POA: Diagnosis present

## 2023-12-31 LAB — COMPREHENSIVE METABOLIC PANEL WITH GFR
ALT: 16 U/L (ref 0–44)
AST: 19 U/L (ref 15–41)
Albumin: 4.3 g/dL (ref 3.5–5.0)
Alkaline Phosphatase: 63 U/L (ref 38–126)
Anion gap: 11 (ref 5–15)
BUN: 17 mg/dL (ref 6–20)
CO2: 24 mmol/L (ref 22–32)
Calcium: 9.3 mg/dL (ref 8.9–10.3)
Chloride: 104 mmol/L (ref 98–111)
Creatinine, Ser: 0.82 mg/dL (ref 0.44–1.00)
GFR, Estimated: >60 mL/min (ref >60)
Glucose, Bld: 97 mg/dL (ref 70–99)
Potassium: 4 mmol/L (ref 3.5–5.1)
Sodium: 140 mmol/L (ref 135–145)
Total Bilirubin: 0.4 mg/dL (ref 0.0–1.2)
Total Protein: 6.7 g/dL (ref 6.5–8.1)

## 2023-12-31 LAB — CBC
HCT: 43.3 % (ref 36.0–46.0)
Hemoglobin: 14.3 g/dL (ref 12.0–15.0)
MCH: 32.4 pg (ref 26.0–34.0)
MCHC: 33 g/dL (ref 30.0–36.0)
MCV: 98 fL (ref 80.0–100.0)
Platelets: 236 K/uL (ref 150–400)
RBC: 4.42 MIL/uL (ref 3.87–5.11)
RDW: 12.9 % (ref 11.5–15.5)
WBC: 8.2 K/uL (ref 4.0–10.5)
nRBC: 0 % (ref 0.0–0.2)

## 2023-12-31 LAB — URINALYSIS, ROUTINE W REFLEX MICROSCOPIC
Bilirubin Urine: NEGATIVE
Glucose, UA: NEGATIVE mg/dL
Hgb urine dipstick: NEGATIVE
Ketones, ur: NEGATIVE mg/dL
Leukocytes,Ua: NEGATIVE
Nitrite: NEGATIVE
Protein, ur: NEGATIVE mg/dL
Specific Gravity, Urine: 1.01 (ref 1.005–1.030)
pH: 5.5 (ref 5.0–8.0)

## 2023-12-31 LAB — LIPASE, BLOOD: Lipase: 28 U/L (ref 11–51)

## 2023-12-31 MED ORDER — SODIUM CHLORIDE 0.9 % IV BOLUS
1000.0000 mL | Freq: Once | INTRAVENOUS | Status: AC
  Administered 2023-12-31: 1000 mL via INTRAVENOUS

## 2023-12-31 MED ORDER — ONDANSETRON 4 MG PO TBDP: 4.0000 mg | ORAL_TABLET | Freq: Three times a day (TID) | ORAL | 0 refills | Status: DC | PRN

## 2023-12-31 MED ORDER — OXYCODONE HCL 5 MG PO TABS: 5.0000 mg | ORAL_TABLET | ORAL | 0 refills | Status: DC | PRN

## 2023-12-31 MED ORDER — ONDANSETRON HCL 4 MG/2ML IJ SOLN
4.0000 mg | Freq: Once | INTRAMUSCULAR | Status: AC
  Administered 2023-12-31: 4 mg via INTRAVENOUS
  Filled 2023-12-31: qty 2

## 2023-12-31 MED ORDER — IOHEXOL 300 MG/ML  SOLN
85.0000 mL | Freq: Once | INTRAMUSCULAR | Status: AC | PRN
  Administered 2023-12-31: 100 mL via INTRAVENOUS

## 2023-12-31 MED ORDER — HYDROMORPHONE HCL 1 MG/ML IJ SOLN
0.5000 mg | Freq: Once | INTRAMUSCULAR | Status: AC
  Administered 2023-12-31: 0.5 mg via INTRAVENOUS
  Filled 2023-12-31: qty 1

## 2023-12-31 NOTE — Discharge Instructions (Addendum)
 Zofran  as needed as prescribed for nausea and vomiting. Norco as needed as prescribed for pain. Follow up with general surgery, call to schedule an appointment. See gallbladder eating plan. Return to ER for severe or concerning symptoms.

## 2023-12-31 NOTE — ED Notes (Signed)
 Pt pending discharge following completion of IVF

## 2023-12-31 NOTE — ED Provider Notes (Signed)
 [place] EMERGENCY DEPARTMENT AT [facility] Provider Note   CSN: [id number] Arrival date & time: 12/31/23  1647     Patient presents with: Abdominal Pain   Nichole Manning is a 58 y.o. female.   58 year old female with history of IBS-D, hypothyroid, anxiety, fibroids, palpitations, interstitial lung disease, hyperlipidemia presents with complaint of abdominal pain.  Patient reports abdominoplasty earlier this year, prior appendectomy in 2022.  Developed left lower quadrant abdominal pain about a month ago, seen in the emergency room at that time with negative workup.  Pain is now more periumbilical, is constant although worse at times such as drinking water or eating anything.  Patient is followed by GI, last had endoscopy and colonoscopy prior to the onset of her symptoms.  Reports nausea without vomiting, no changes in bowel or bladder habits.  Was recently treated for UTI, reports E. coli on culture.       Prior to Admission medications   Medication Sig Start Date End Date Taking? Authorizing Provider  ondansetron  (ZOFRAN -ODT) 4 MG disintegrating tablet Take 1 tablet (4 mg total) by mouth every 8 (eight) hours as needed for nausea or vomiting. 12/31/23  Yes Beverley Leita LABOR, PA-C  oxyCODONE  (ROXICODONE ) 5 MG immediate release tablet Take 1 tablet (5 mg total) by mouth every 4 (four) hours as needed for severe pain (pain score 7-10). 12/31/23  Yes Beverley Leita LABOR, PA-C  Clindamycin-Benzoyl Per, Refr, gel Apply 1 application topically daily. 08/27/20   [provider]  cyanocobalamin (,VITAMIN B-12,) 1000 MCG/ML injection Inject 1,000 mcg into the muscle every 28 (twenty-eight) days. 08/08/20   [provider]  dicyclomine  (BENTYL ) 20 MG tablet Take 1 tablet (20 mg total) by mouth 2 (two) times daily. 11/14/23   Desiderio Chew, PA-C  Erenumab -aooe (AIMOVIG ) 140 MG/ML SOAJ Inject 140 mg into the skin every 28 (twenty-eight) days. 11/09/23   Skeet Juliene SAUNDERS, DO  estradiol  (ESTRACE) 0.5 MG tablet Take 0.5 mg by mouth daily. 10/23/22   [provider]  hydrOXYzine (VISTARIL) 25 MG capsule Take 25-50 mg by mouth at bedtime as needed. 10/23/22   [provider]  levothyroxine  (SYNTHROID ) 88 MCG tablet Take 88 mcg by mouth daily. 11/05/23   [provider]  linaclotide (LINZESS) 145 MCG CAPS capsule Take 145 mg by mouth daily.    [provider]  minoxidil (LONITEN) 2.5 MG tablet Take 2.5 mg by mouth daily. 08/28/22   [provider]  Multiple Vitamin (MULTIVITAMIN WITH MINERALS) TABS tablet Take 1 tablet by mouth daily.    [provider]  nitrofurantoin , macrocrystal-monohydrate, (MACROBID ) 100 MG capsule Take 1 capsule (100 mg total) by mouth 2 (two) times daily. 11/14/23   Desiderio Chew, PA-C  omeprazole  (PRILOSEC) 40 MG capsule Take 1 capsule (40 mg total) by mouth 2 (two) times daily. 11/27/23   Heinz, Sara E, PA-C  OZEMPIC, 0.25 OR 0.5 MG/DOSE, 2 MG/3ML SOPN Inject 0.25 mg as directed once a week. 05/12/23   [provider]  progesterone (PROMETRIUM) 200 MG capsule Take 200 mg by mouth at bedtime. PRN 10/23/22   [provider]  promethazine (PHENERGAN) 12.5 MG tablet Take 12.5 mg by mouth every 4 (four) hours as needed. 09/05/23   [provider]  propranolol  ER (INDERAL  LA) 60 MG 24 hr capsule TAKE 1 CAPSULE BY MOUTH EVERY DAY 11/09/23   Lonni Slain, MD  rosuvastatin  (CRESTOR ) 5 MG tablet Take 1 tablet (5 mg total) by mouth daily. 05/25/23 11/27/23  Lucien Orren SAILOR, PA-C  SPIRIVA RESPIMAT 2.5 MCG/ACT AERS Inhale 2 puffs into the lungs daily at 6 (six) AM. 05/14/23   [provider]  sucralfate  (CARAFATE ) 1 g tablet Take 1 tablet (1 g total) by mouth 2 (two) times daily. 11/27/23   Arletta, Sara E, PA-C  SYMBICORT 160-4.5 MCG/ACT inhaler Inhale 2 puffs into the lungs as needed. 09/23/22   [provider]  Tretinoin (ALTRENO) 0.05 % LOTN Apply 0.05 % topically. 05/23/22    [provider]  venlafaxine XR (EFFEXOR-XR) 37.5 MG 24 hr capsule Take 37.5 mg by mouth at bedtime. 09/29/23   [provider]  vitamin C (ASCORBIC ACID) 500 MG tablet Take 500 mg by mouth daily.    [provider]    Allergies: Sulfa antibiotics, Adhesive [tape], Amoxicillin, and Codeine    Review of Systems Negative except as per HPI Updated Vital Signs BP 112/71 (BP Location: Right Arm)   Pulse 62   Temp 98.8 F (37.1 C) (Oral)   Resp 16   Ht 5' 5 (1.651 m)   Wt 59 kg   SpO2 98%   BMI 21.63 kg/m   Physical Exam Vitals and nursing note reviewed.  Constitutional:      General: She is not in acute distress.    Appearance: She is well-developed. She is not diaphoretic.  HENT:     Head: Normocephalic and atraumatic.  Cardiovascular:     Pulses: Normal pulses.  Pulmonary:     Effort: Pulmonary effort is normal.  Abdominal:     Palpations: Abdomen is soft.     Tenderness: There is abdominal tenderness in the periumbilical area.  Musculoskeletal:     Right lower leg: No edema.     Left lower leg: No edema.  Skin:    General: Skin is warm and dry.  Neurological:     Mental Status: She is alert and oriented to person, place, and time.  Psychiatric:        Behavior: Behavior normal.     (all labs ordered are listed, but only abnormal results are displayed) Labs Reviewed  URINALYSIS, ROUTINE W REFLEX MICROSCOPIC - Abnormal; Notable for the following components:      Result Value   Color, Urine COLORLESS (*)    All other components within normal limits  LIPASE, BLOOD  COMPREHENSIVE METABOLIC PANEL WITH GFR  CBC    EKG: None  Radiology: US  Abdomen Limited RUQ (LIVER/GB) Result Date: 12/31/2023 EXAM: Right Upper Quadrant Abdominal Ultrasound 12/31/2023 08:47:03 PM TECHNIQUE: Real-time ultrasonography of the right upper quadrant of the abdomen was performed. COMPARISON: CT abdomen / pelvis earlier today. CLINICAL HISTORY: RUQ pain.  FINDINGS: LIVER: The liver demonstrates normal echogenicity. No intrahepatic biliary ductal dilatation. No evidence of mass. BILIARY SYSTEM: Multiple layering gallstones, measuring up to 13 mm. No pericholecystic fluid or gallbladder wall thickening. Common bile duct measures 5 mm. OTHER: No right upper quadrant ascites. IMPRESSION: 1. Cholelithiasis, without associated sonographic findings to suggest acute cholecystitis. Electronically signed by: Pinkie Pebbles MD 12/31/2023 08:59 PM EDT RP Workstation: [id number]   CT ABDOMEN PELVIS W CONTRAST Result Date: 12/31/2023 CLINICAL DATA:  Abdominal pain. EXAM: CT ABDOMEN AND PELVIS WITH CONTRAST TECHNIQUE: Multidetector CT imaging of the abdomen and pelvis was performed using the standard protocol following bolus administration of intravenous contrast. RADIATION DOSE REDUCTION: This exam was performed according to the departmental dose-optimization program which includes automated exposure control, adjustment of the mA and/or kV according to patient size and/or use  of iterative reconstruction technique. CONTRAST:  OMNIPAQUE  IOHEXOL  300 MG/ML  SOLN COMPARISON:  CT angio abdomen November 27, 2023 FINDINGS: Lower chest: No acute abnormality. Hepatobiliary: No focal liver abnormality is seen. Subcentimeter liver lesion in segment 4 too small to characterize likely a cyst. (4/57) It is fundal adenomyomatosis. Query small intraluminal gallstones in the fundal region measuring 5 mm. (4/47). Normal gallbladder wall thickness. No pericholecystic fluid. CBD slightly prominent measuring 5.7 mm. No definite choledocholithiasis identified. Pancreas: Unremarkable. No pancreatic ductal dilatation or surrounding inflammatory changes. Spleen: Normal in size without focal abnormality. Adrenals/Urinary Tract: Adrenal glands are unremarkable. Small left kidney cortical cyst which does not require imaging follow-up otherwise kidneys are normal, without renal calculi, focal lesion, or  hydronephrosis. Bladder is unremarkable. Stomach/Bowel: Stomach is within normal limits. Appendectomy. No evidence of bowel wall thickening, distention, or inflammatory changes. Vascular/Lymphatic: Aortic atherosclerosis. No enlarged abdominal or pelvic lymph nodes. Reproductive: Hysterectomy.  No adnexal mass. Other: Unremarkable Musculoskeletal: No suspicious osseous abnormality. IMPRESSION: Cholelithiasis and suggestion of fundal gallbladder adenomyomatosis. Recommend ultrasound for further confirmation. Left kidney cortical cyst. Subcentimeter segment 4 liver lesion, too small to characterize likely in cyst. Electronically Signed   By: Duwaine Severs M.D.   On: 12/31/2023 19:21     Procedures   Medications Ordered in the ED  iohexol  (OMNIPAQUE ) 300 MG/ML solution 85 mL (100 mLs Intravenous Contrast Given 12/31/23 1847)  sodium chloride  0.9 % bolus 1,000 mL (1,000 mLs Intravenous New Bag/Given 12/31/23 2056)  HYDROmorphone  (DILAUDID ) injection 0.5 mg (0.5 mg Intravenous Given 12/31/23 2115)  ondansetron  (ZOFRAN ) injection 4 mg (4 mg Intravenous Given 12/31/23 2114)                                    Medical Decision Making Amount and/or Complexity of Data Reviewed Labs: ordered. Radiology: ordered.  Risk Prescription drug management.   This patient presents to the ED for concern of abdominal pain, this involves an extensive number of treatment options, and is a complaint that carries with it a high risk of complications and morbidity.  The differential diagnosis includes but not limited to scar tissue from prior abdominal surgery, hernia, cholelithiasis, pancreatitis, diverticulitis.    Co morbidities / Chronic conditions that complicate the patient evaluation  IBS-D, hypothyroid, anxiety, fibroids, palpitations, interstitial lung disease, hyperlipidemia   Additional history obtained:  Additional history obtained from EMR External records from outside source obtained and reviewed  including prior HIDA, prior CT a/p   Lab Tests:  I Ordered, and personally interpreted labs.  The pertinent results include: Labs reassuring including normal CBC, normal CMP, normal lipase.  Urinalysis is unremarkable.   Imaging Studies ordered:  I ordered imaging studies including CT abdomen pelvis with contrast, right upper quadrant ultrasound I independently visualized and interpreted imaging which showed cholelithiasis without acute cholecystitis I agree with the radiologist interpretation    Problem List / ED Course / Critical interventions / Medication management  58 year old female presents with ongoing abdominal pain.  She notes this initially began in her left lower quadrant but has since become a periumbilical pain.  She states this is fairly constant, is worse with eating, unable to tolerate water without pain.  Patient has been followed by GI for her symptoms, had HIDA scan last week that was normal.  Labs normal.  CT abdomen pelvis obtained for further evaluation of patient's pain and reveals possible gallstones.  Incidental findings as  discussed with patient.  Follow-up with ultrasound right upper quadrant which confirms cholelithiasis without acute cholecystitis.  Discussed management with patient and reasons to return, follow-up with general surgery. I ordered medication including IV fluids, Dilaudid , Zofran  Reevaluation of the patient after these medicines showed that the patient remained stable I have reviewed the patients home medicines and have made adjustments as needed  Social Determinants of Health:  Lives with spouse, has PCP   Test / Admission - Considered:  Stable for discharge with plan to follow-up with general surgery.      Final diagnoses:  Calculus of gallbladder without cholecystitis without obstruction    ED Discharge Orders          Ordered    oxyCODONE  (ROXICODONE ) 5 MG immediate release tablet  Every 4 hours PRN        12/31/23 2111     ondansetron  (ZOFRAN -ODT) 4 MG disintegrating tablet  Every 8 hours PRN        12/31/23 2111               Beverley Leita LABOR, PA-C 12/31/23 2122    Kammerer, Megan L, DO 01/02/24 1826

## 2023-12-31 NOTE — ED Triage Notes (Signed)
 Pt reports abd pain x2 months. Pt seen for same here. Pt reports pain in generalized in the upper abd. Pt reports nausea, denies any vomiting.

## 2023-12-31 NOTE — ED Notes (Signed)
 Patient transported to CT

## 2023-12-31 NOTE — ED Notes (Signed)
 AVS provided by edp was reviewed with the pt including follow up care and pain management. Pt was able to verbalize understanding with no additional questions at this time. Pharmacy verified with pt. Pt going home with s/o at bedside

## 2024-01-01 NOTE — Telephone Encounter (Signed)
 Called and spoke with patient this am. She went to ER yesterday. She reports that her gallbladder has multiple stones and they have placed a referral for surgery. Patient wants to know if she should keep taking the Carafate  and Omeprazole  since we know the cause of her discomfort. ER provider ordered nausea and pain medication for patient. Patient would also like to know if she should keep the upcoming f/u appointment on 01/11/2024.

## 2024-01-07 NOTE — Progress Notes (Signed)
 Please place orders for PAT appointment scheduled 01/08/24

## 2024-01-08 ENCOUNTER — Encounter (HOSPITAL_COMMUNITY): Payer: Self-pay

## 2024-01-08 ENCOUNTER — Other Ambulatory Visit: Payer: Self-pay

## 2024-01-08 ENCOUNTER — Encounter (HOSPITAL_COMMUNITY)
Admission: RE | Admit: 2024-01-08 | Discharge: 2024-01-08 | Disposition: A | Discharge status: Home / Self Care | Source: Ambulatory Visit | Attending: General Surgery | Admitting: General Surgery

## 2024-01-08 DIAGNOSIS — Z01818 Encounter for other preprocedural examination: Secondary | ICD-10-CM | POA: Insufficient documentation

## 2024-01-08 DIAGNOSIS — Z87891 Personal history of nicotine dependence: Secondary | ICD-10-CM | POA: Diagnosis not present

## 2024-01-08 DIAGNOSIS — K8020 Calculus of gallbladder without cholecystitis without obstruction: Secondary | ICD-10-CM | POA: Diagnosis not present

## 2024-01-08 HISTORY — DX: Nausea with vomiting, unspecified: R11.2

## 2024-01-08 HISTORY — DX: Headache, unspecified: R51.9

## 2024-01-08 NOTE — Progress Notes (Addendum)
 COVID Vaccine Completed:  Date of COVID positive in last 90 days:  PCP - Channing Schaffer, FNP Cardiologist - Shelda Bruckner, MD Pulmonologist- Lonna Coder, MD  Chest CT-08/07/23 Epic Chest x-ray - n/a EKG - 08/24/23 Epic Stress Test - 08/05/22 Epic ECHO - 07/18/22 Epic Cardiac Cath - n/a Pacemaker/ICD device last checked: n/a Spinal Cord Stimulator:n/a  Bowel Prep - no  Sleep Study - n/a CPAP -   Fasting Blood Sugar - n/a Checks Blood Sugar _____ times a day  Last dose of GLP1 agonist-  Ozempic, takes Mondays GLP1 instructions:  Do not take after 01/06/24. Has not taken since July 10th   Last dose of SGLT-2 inhibitors-  N/A SGLT-2 instructions:  Do not take after     Blood Thinner Instructions:  Last dose: n/a Time: Aspirin Instructions: Last Dose:  Activity level: Can go up a flight of stairs and perform activities of daily living without stopping and without symptoms of chest pain or shortness of breath.  Anesthesia review: SOB, fatigue, palpitations, PVCs, emphysema, aortic atherosclerosis, ILD  Patient denies shortness of breath, fever, cough and chest pain at PAT appointment  Patient verbalized understanding of instructions that were given to them at the PAT appointment. Patient was also instructed that they will need to review over the PAT instructions again at home before surgery.

## 2024-01-08 NOTE — Patient Instructions (Addendum)
 SURGICAL WAITING ROOM VISITATION  Patients having surgery or a procedure may have no more than 2 support people in the waiting area - these visitors may rotate.    Children under the age of 43 must have an adult with them who is not the patient.  Visitors with respiratory illnesses are discouraged from visiting and should remain at home.  If the patient needs to stay at the hospital during part of their recovery, the visitor guidelines for inpatient rooms apply. Pre-op nurse will coordinate an appropriate time for 1 support person to accompany patient in pre-op.  This support person may not rotate.    Please refer to the [facility] website for the visitor guidelines for Inpatients (after your surgery is over and you are in a regular room).    Your procedure is scheduled on: 01/14/24   Report to [facility] Main Entrance    Report to admitting at 9:15 AM   Call this number if you have problems the morning of surgery [phone]   Do not eat food or drink liquids :After Midnight.          If you have questions, please contact your surgeon's office.   FOLLOW BOWEL PREP AND ANY ADDITIONAL PRE OP INSTRUCTIONS YOU RECEIVED FROM YOUR SURGEON'S OFFICE!!!     Oral Hygiene is also important to reduce your risk of infection.                                    Remember - BRUSH YOUR TEETH THE MORNING OF SURGERY WITH YOUR REGULAR TOOTHPASTE  DENTURES WILL BE REMOVED PRIOR TO SURGERY PLEASE DO NOT APPLY Poly grip OR ADHESIVES!!!   Stop all vitamins and herbal supplements 7 days before surgery.   Take these medicines the morning of surgery with A SIP OF WATER: Levothyroxine , Omeprazole , Zofran , Oxycodone , Promethazine, Rosuvastatin , Tramadol , Venlafaxine                               You may not have any metal on your body including hair pins, jewelry, and body piercing             Do not wear make-up, lotions, powders, perfumes, or deodorant  Do not wear nail polish including gel  and S&S, artificial/acrylic nails, or any other type of covering on natural nails including finger and toenails. If you have artificial nails, gel coating, etc. that needs to be removed by a nail salon please have this removed prior to surgery or surgery may need to be canceled/ delayed if the surgeon/ anesthesia feels like they are unable to be safely monitored.   Do not shave  48 hours prior to surgery.    Do not bring valuables to the hospital. [place] IS NOT             RESPONSIBLE   FOR VALUABLES.   Contacts, glasses, dentures or bridgework may not be worn into surgery.  DO NOT BRING YOUR HOME MEDICATIONS TO THE HOSPITAL. PHARMACY WILL DISPENSE MEDICATIONS LISTED ON YOUR MEDICATION LIST TO YOU DURING YOUR ADMISSION IN THE HOSPITAL!    Patients discharged on the day of surgery will not be allowed to drive home.  Someone NEEDS to stay with you for the first 24 hours after anesthesia.   Special Instructions: Bring a copy of your healthcare power of attorney and living  will documents the day of surgery if you haven't scanned them before.              Please read over the following fact sheets you were given: IF YOU HAVE QUESTIONS ABOUT YOUR PRE-OP INSTRUCTIONS PLEASE CALL [phone].   If you received a COVID test during your pre-op visit  it is requested that you wear a mask when out in public, stay away from anyone that may not be feeling well and notify your surgeon if you develop symptoms. If you test positive for Covid or have been in contact with anyone that has tested positive in the last 10 days please notify you surgeon.    [place] - Preparing for Surgery Before surgery, you can play an important role.  Because skin is not sterile, your skin needs to be as free of germs as possible.  You can reduce the number of germs on your skin by washing with CHG (chlorahexidine gluconate) soap before surgery.  CHG is an antiseptic cleaner which kills germs and bonds with the  skin to continue killing germs even after washing. Please DO NOT use if you have an allergy to CHG or antibacterial soaps.  If your skin becomes reddened/irritated stop using the CHG and inform your nurse when you arrive at Short Stay. Do not shave (including legs and underarms) for at least 48 hours prior to the first CHG shower.  You may shave your face/neck.  Please follow these instructions carefully:  1.  Shower with CHG Soap the night before surgery and the  morning of surgery.  2.  If you choose to wash your hair, wash your hair first as usual with your normal  shampoo.  3.  After you shampoo, rinse your hair and body thoroughly to remove the shampoo.                             4.  Use CHG as you would any other liquid soap.  You can apply chg directly to the skin and wash.  Gently with a scrungie or clean washcloth.  5.  Apply the CHG Soap to your body ONLY FROM THE NECK DOWN.   Do   not use on face/ open                           Wound or open sores. Avoid contact with eyes, ears mouth and   genitals (private parts).                       Wash face,  Genitals (private parts) with your normal soap.             6.  Wash thoroughly, paying special attention to the area where your    surgery  will be performed.  7.  Thoroughly rinse your body with warm water from the neck down.  8.  DO NOT shower/wash with your normal soap after using and rinsing off the CHG Soap.                9.  Pat yourself dry with a clean towel.            10.  Wear clean pajamas.            11.  Place clean sheets on your bed the night of your first shower and do not  sleep with pets. Day of Surgery : Do not apply any lotions/deodorants the morning of surgery.  Please wear clean clothes to the hospital/surgery center.  FAILURE TO FOLLOW THESE INSTRUCTIONS MAY RESULT IN THE CANCELLATION OF YOUR SURGERY  PATIENT SIGNATURE_________________________________  NURSE  SIGNATURE__________________________________  ________________________________________________________________________

## 2024-01-11 ENCOUNTER — Ambulatory Visit: Admitting: Gastroenterology

## 2024-01-11 NOTE — Progress Notes (Signed)
 Anesthesia Chart Review   Case: [id number] Date/Time: 01/14/24 1115   Procedure: LAPAROSCOPIC CHOLECYSTECTOMY - LAPAROSCOPIC CHOLECYSTECTOMY WITH ICG   Anesthesia type: General   Diagnosis:      Calculus of gallbladder without cholecystitis without obstruction [K80.20]     Abdominal pain, periumbilical [R10.33]   Pre-op diagnosis: cholelithiasis   Location: [facility] ROOM 01 / [facility]   Surgeons: [name], MD       DISCUSSION:58 y.o. former smoker with h/o PONV, hypothyroidism, ILD, PVCs, cholelithiasis scheduled for above procedure 01/14/2024 with Dr. Locus Tanda.   Pt follows with pulmonology for ILD, last seen 12/01/23. Mild emphysema on CT with normal lung function, no evidence of COPD. Previous CT scan showed inflammatory changes, but follow-up scans, including a high-resolution scan in Feb 2025, show improvement with no residual evidence of interstitial lung disease.    Pt follows with cardio for PSVT, PVCs, PACs. Last seen 08/24/23. Per notes PSVT, PVCs, PACs controlled on propanolol. Cardiac testing reassuring. 6 month follow up recommended.   Pt reports she can climb a flight of stairs without difficulty, denies shortness of breath or chest pain.  VS: BP 101/79   Pulse (!) 59   Temp 36.7 C (Oral)   Resp 14   Ht 5' 5 (1.651 m)   Wt 60.8 kg   SpO2 100%   BMI 22.30 kg/m   PROVIDERS: Donal Channing SQUIBB, FNP is PCP    LABS: Labs reviewed: Acceptable for surgery. (all labs ordered are listed, but only abnormal results are displayed)  Labs Reviewed - No data to display   IMAGES:   EKG:   CV: Stress Test 08/05/22    Good exercise capacity, achieved 9.2 METS   Peak heart rate 166 bpm (101% max age-predicted heart rate)   Normal blood pressure response to exercise   up sloping ST depression was noted.  No evidence of ischemia.   Low risk study  Echo 07/18/22  1. Left ventricular ejection fraction, by estimation, is 60 to 65%. The  left ventricle has normal function. The  left ventricle has no regional  wall motion abnormalities. Left ventricular diastolic parameters were  normal.   2. Right ventricular systolic function is normal. The right ventricular  size is normal.   3. The mitral valve is normal in structure. Trivial mitral valve  regurgitation. No evidence of mitral stenosis.   4. The aortic valve is tricuspid. Aortic valve regurgitation is not  visualized. No aortic stenosis is present.   5. The inferior vena cava is normal in size with greater than 50%  respiratory variability, suggesting right atrial pressure of 3 mmHg.  Past Medical History:  Diagnosis Date   Anxiety    Constipation    Fibroids    Headache    migraines   Hyperlipidemia    Hypothyroidism    ILD (interstitial lung disease) (HCC)    Palpitations    PONV (postoperative nausea and vomiting)    mild nausea per pt   PVC's (premature ventricular contractions)    SVD (spontaneous vaginal delivery)    x 3    Past Surgical History:  Procedure Laterality Date   ancillary surgery Bilateral    09/09/23 fat removal   APPENDECTOMY     laparoscopic   AUGMENTATION MAMMAPLASTY     BILATERAL SALPINGECTOMY Bilateral 07/01/2013   Procedure: BILATERAL SALPINGECTOMY;  Surgeon: [name], MD;  Location: [facility];  Service: Gynecology;  Laterality: Bilateral;   BLEPHAROPLASTY Bilateral    BREAST SURGERY  breast reduction x 2   LAPAROSCOPIC APPENDECTOMY N/A 09/09/2020   Procedure: APPENDECTOMY LAPAROSCOPIC;  Surgeon: Kinsinger, Herlene Righter, MD;  Location: [facility];  Service: General;  Laterality: N/A;   TUBAL LIGATION     Tummy tuck     09/09/23   WISDOM TOOTH EXTRACTION      MEDICATIONS:  Clindamycin-Benzoyl Per, Refr, gel   cyanocobalamin (,VITAMIN B-12,) 1000 MCG/ML injection   dicyclomine  (BENTYL ) 20 MG tablet   Erenumab -aooe (AIMOVIG ) 140 MG/ML SOAJ   estradiol (ESTRACE) 0.5 MG tablet   hydrOXYzine (VISTARIL) 25 MG capsule   levothyroxine  (SYNTHROID ) 88 MCG tablet    linaclotide  (LINZESS ) 145 MCG CAPS capsule   minoxidil (LONITEN) 2.5 MG tablet   Multiple Vitamin (MULTIVITAMIN WITH MINERALS) TABS tablet   nitrofurantoin , macrocrystal-monohydrate, (MACROBID ) 100 MG capsule   omeprazole  (PRILOSEC) 40 MG capsule   ondansetron  (ZOFRAN -ODT) 4 MG disintegrating tablet   oxyCODONE  (ROXICODONE ) 5 MG immediate release tablet   OZEMPIC, 0.25 OR 0.5 MG/DOSE, 2 MG/3ML SOPN   progesterone (PROMETRIUM) 200 MG capsule   promethazine (PHENERGAN) 12.5 MG tablet   propranolol  ER (INDERAL  LA) 60 MG 24 hr capsule   rosuvastatin  (CRESTOR ) 5 MG tablet   SPIRIVA RESPIMAT 2.5 MCG/ACT AERS   sucralfate  (CARAFATE ) 1 g tablet   traMADol  (ULTRAM ) 50 MG tablet   tretinoin (RETIN-A) 0.05 % cream   venlafaxine XR (EFFEXOR-XR) 37.5 MG 24 hr capsule   vitamin C (ASCORBIC ACID) 500 MG tablet   No current facility-administered medications for this encounter.      Harlene Hoots Ward, PA-C [facility]Surgical [facility] [phone]

## 2024-01-14 ENCOUNTER — Encounter (HOSPITAL_COMMUNITY): Payer: Self-pay | Admitting: General Surgery

## 2024-01-14 ENCOUNTER — Encounter (HOSPITAL_COMMUNITY)
Admission: RE | Disposition: A | Discharge status: Home / Self Care | Payer: Self-pay | Source: Home / Self Care | Attending: General Surgery

## 2024-01-14 ENCOUNTER — Ambulatory Visit (HOSPITAL_BASED_OUTPATIENT_CLINIC_OR_DEPARTMENT_OTHER): Admitting: Anesthesiology

## 2024-01-14 ENCOUNTER — Ambulatory Visit (HOSPITAL_COMMUNITY)
Admission: RE | Admit: 2024-01-14 | Discharge: 2024-01-14 | Disposition: A | Discharge status: Home / Self Care | Attending: General Surgery | Admitting: General Surgery

## 2024-01-14 ENCOUNTER — Encounter (HOSPITAL_COMMUNITY): Payer: Self-pay | Admitting: Physician Assistant

## 2024-01-14 ENCOUNTER — Ambulatory Visit: Payer: Self-pay | Admitting: General Surgery

## 2024-01-14 DIAGNOSIS — K769 Liver disease, unspecified: Secondary | ICD-10-CM | POA: Diagnosis not present

## 2024-01-14 DIAGNOSIS — Z800 Family history of malignant neoplasm of digestive organs: Secondary | ICD-10-CM | POA: Insufficient documentation

## 2024-01-14 DIAGNOSIS — J439 Emphysema, unspecified: Secondary | ICD-10-CM | POA: Diagnosis not present

## 2024-01-14 DIAGNOSIS — Z87891 Personal history of nicotine dependence: Secondary | ICD-10-CM | POA: Diagnosis not present

## 2024-01-14 DIAGNOSIS — N281 Cyst of kidney, acquired: Secondary | ICD-10-CM | POA: Diagnosis not present

## 2024-01-14 DIAGNOSIS — K8020 Calculus of gallbladder without cholecystitis without obstruction: Secondary | ICD-10-CM | POA: Diagnosis not present

## 2024-01-14 DIAGNOSIS — K8010 Calculus of gallbladder with chronic cholecystitis without obstruction: Secondary | ICD-10-CM | POA: Diagnosis not present

## 2024-01-14 DIAGNOSIS — R1033 Periumbilical pain: Secondary | ICD-10-CM | POA: Diagnosis present

## 2024-01-14 HISTORY — PX: CHOLECYSTECTOMY: SHX55

## 2024-01-14 SURGERY — LAPAROSCOPIC CHOLECYSTECTOMY
Anesthesia: General

## 2024-01-14 MED ORDER — ACETAMINOPHEN 500 MG PO TABS
1000.0000 mg | ORAL_TABLET | ORAL | Status: AC
  Administered 2024-01-14: 1000 mg via ORAL
  Filled 2024-01-14: qty 2

## 2024-01-14 MED ORDER — LACTATED RINGERS IV SOLN: INTRAVENOUS | Status: DC

## 2024-01-14 MED ORDER — OXYCODONE HCL 5 MG PO TABS
ORAL_TABLET | ORAL | Status: AC
Start: 2024-01-14 — End: 2024-01-14
  Filled 2024-01-14: qty 1

## 2024-01-14 MED ORDER — LIDOCAINE HCL (PF) 2 % IJ SOLN
INTRAMUSCULAR | Status: AC
  Filled 2024-01-14: qty 5

## 2024-01-14 MED ORDER — DROPERIDOL 2.5 MG/ML IJ SOLN
INTRAMUSCULAR | Status: AC
  Filled 2024-01-14: qty 2

## 2024-01-14 MED ORDER — ROCURONIUM BROMIDE 10 MG/ML (PF) SYRINGE
PREFILLED_SYRINGE | INTRAVENOUS | Status: AC
  Filled 2024-01-14: qty 10

## 2024-01-14 MED ORDER — FENTANYL CITRATE PF 50 MCG/ML IJ SOSY
25.0000 ug | PREFILLED_SYRINGE | INTRAMUSCULAR | Status: DC | PRN
  Administered 2024-01-14 (×2): 50 ug via INTRAVENOUS

## 2024-01-14 MED ORDER — ORAL CARE MOUTH RINSE: 15.0000 mL | Freq: Once | OROMUCOSAL | Status: AC

## 2024-01-14 MED ORDER — CHLORHEXIDINE GLUCONATE CLOTH 2 % EX PADS: 6.0000 | MEDICATED_PAD | Freq: Once | CUTANEOUS | Status: DC

## 2024-01-14 MED ORDER — PHENYLEPHRINE 80 MCG/ML (10ML) SYRINGE FOR IV PUSH (FOR BLOOD PRESSURE SUPPORT)
PREFILLED_SYRINGE | INTRAVENOUS | Status: DC | PRN
  Administered 2024-01-14: 160 ug via INTRAVENOUS

## 2024-01-14 MED ORDER — DEXAMETHASONE SODIUM PHOSPHATE 10 MG/ML IJ SOLN
INTRAMUSCULAR | Status: AC
  Filled 2024-01-14: qty 1

## 2024-01-14 MED ORDER — MIDAZOLAM HCL 2 MG/2ML IJ SOLN
INTRAMUSCULAR | Status: AC
  Filled 2024-01-14: qty 2

## 2024-01-14 MED ORDER — SCOPOLAMINE 1 MG/3DAYS TD PT72
MEDICATED_PATCH | TRANSDERMAL | Status: AC
  Administered 2024-01-14: 1.5 mg via TRANSDERMAL
  Filled 2024-01-14: qty 1

## 2024-01-14 MED ORDER — LIDOCAINE HCL (CARDIAC) PF 100 MG/5ML IV SOSY
PREFILLED_SYRINGE | INTRAVENOUS | Status: DC | PRN
  Administered 2024-01-14: 60 mg via INTRAVENOUS

## 2024-01-14 MED ORDER — KETAMINE HCL 50 MG/5ML IJ SOSY
PREFILLED_SYRINGE | INTRAMUSCULAR | Status: AC
Start: 2024-01-14 — End: 2024-01-14
  Filled 2024-01-14: qty 5

## 2024-01-14 MED ORDER — CEFAZOLIN SODIUM-DEXTROSE 2-4 GM/100ML-% IV SOLN
2.0000 g | INTRAVENOUS | Status: AC
  Administered 2024-01-14: 2 g via INTRAVENOUS
  Filled 2024-01-14: qty 100

## 2024-01-14 MED ORDER — PHENYLEPHRINE 80 MCG/ML (10ML) SYRINGE FOR IV PUSH (FOR BLOOD PRESSURE SUPPORT)
PREFILLED_SYRINGE | INTRAVENOUS | Status: AC
  Filled 2024-01-14: qty 10

## 2024-01-14 MED ORDER — BUPIVACAINE-EPINEPHRINE (PF) 0.25% -1:200000 IJ SOLN
INTRAMUSCULAR | Status: AC
  Filled 2024-01-14: qty 30

## 2024-01-14 MED ORDER — LACTATED RINGERS IV SOLN
INTRAVENOUS | Status: AC | PRN
  Administered 2024-01-14: 1000 mL via INTRAVENOUS

## 2024-01-14 MED ORDER — OXYCODONE HCL 5 MG PO TABS
5.0000 mg | ORAL_TABLET | Freq: Once | ORAL | Status: AC | PRN
  Administered 2024-01-14: 5 mg via ORAL

## 2024-01-14 MED ORDER — BUPIVACAINE-EPINEPHRINE 0.25% -1:200000 IJ SOLN
INTRAMUSCULAR | Status: DC | PRN
  Administered 2024-01-14: 30 mL

## 2024-01-14 MED ORDER — SUGAMMADEX SODIUM 200 MG/2ML IV SOLN
INTRAVENOUS | Status: DC | PRN
Start: 2024-01-14 — End: 2024-01-14
  Administered 2024-01-14: 100 mg via INTRAVENOUS

## 2024-01-14 MED ORDER — OXYCODONE HCL 5 MG PO TABS
5.0000 mg | ORAL_TABLET | Freq: Four times a day (QID) | ORAL | 0 refills | Status: DC | PRN
Start: 2024-01-14 — End: 2024-03-03

## 2024-01-14 MED ORDER — FENTANYL CITRATE (PF) 100 MCG/2ML IJ SOLN
INTRAMUSCULAR | Status: AC
  Filled 2024-01-14: qty 2

## 2024-01-14 MED ORDER — FENTANYL CITRATE (PF) 100 MCG/2ML IJ SOLN
INTRAMUSCULAR | Status: DC | PRN
  Administered 2024-01-14 (×2): 25 ug via INTRAVENOUS

## 2024-01-14 MED ORDER — KETOROLAC TROMETHAMINE 30 MG/ML IJ SOLN
INTRAMUSCULAR | Status: AC
  Filled 2024-01-14: qty 1

## 2024-01-14 MED ORDER — PROPOFOL 500 MG/50ML IV EMUL
INTRAVENOUS | Status: DC | PRN
  Administered 2024-01-14: 150 ug/kg/min via INTRAVENOUS

## 2024-01-14 MED ORDER — DEXAMETHASONE SODIUM PHOSPHATE 10 MG/ML IJ SOLN
INTRAMUSCULAR | Status: DC | PRN
Start: 2024-01-14 — End: 2024-01-14
  Administered 2024-01-14: 8 mg via INTRAVENOUS

## 2024-01-14 MED ORDER — FENTANYL CITRATE PF 50 MCG/ML IJ SOSY
PREFILLED_SYRINGE | INTRAMUSCULAR | Status: AC
Start: 2024-01-14 — End: 2024-01-14
  Filled 2024-01-14: qty 2

## 2024-01-14 MED ORDER — SCOPOLAMINE 1 MG/3DAYS TD PT72: 1.0000 | MEDICATED_PATCH | TRANSDERMAL | Status: DC

## 2024-01-14 MED ORDER — KETAMINE HCL 50 MG/5ML IJ SOSY
PREFILLED_SYRINGE | INTRAMUSCULAR | Status: DC | PRN
  Administered 2024-01-14: 20 mg via INTRAVENOUS

## 2024-01-14 MED ORDER — ACETAMINOPHEN 500 MG PO TABS: 1000.0000 mg | ORAL_TABLET | Freq: Three times a day (TID) | ORAL | Status: AC

## 2024-01-14 MED ORDER — CHLORHEXIDINE GLUCONATE 0.12 % MT SOLN
15.0000 mL | Freq: Once | OROMUCOSAL | Status: AC
  Administered 2024-01-14: 15 mL via OROMUCOSAL

## 2024-01-14 MED ORDER — ONDANSETRON HCL 4 MG/2ML IJ SOLN
INTRAMUSCULAR | Status: DC | PRN
  Administered 2024-01-14: 4 mg via INTRAVENOUS

## 2024-01-14 MED ORDER — ROCURONIUM BROMIDE 100 MG/10ML IV SOLN
INTRAVENOUS | Status: DC | PRN
  Administered 2024-01-14: 50 mg via INTRAVENOUS

## 2024-01-14 MED ORDER — KETOROLAC TROMETHAMINE 30 MG/ML IJ SOLN
INTRAMUSCULAR | Status: DC | PRN
Start: 2024-01-14 — End: 2024-01-14
  Administered 2024-01-14: 30 mg via INTRAVENOUS

## 2024-01-14 MED ORDER — PROPOFOL 10 MG/ML IV BOLUS
INTRAVENOUS | Status: DC | PRN
Start: 2024-01-14 — End: 2024-01-14
  Administered 2024-01-14: 140 mg via INTRAVENOUS

## 2024-01-14 MED ORDER — PROPOFOL 10 MG/ML IV BOLUS
INTRAVENOUS | Status: AC
  Filled 2024-01-14: qty 20

## 2024-01-14 MED ORDER — LINACLOTIDE 145 MCG PO CAPS: 145.0000 ug | ORAL_CAPSULE | Freq: Every day | ORAL | Status: DC

## 2024-01-14 MED ORDER — SPY AGENT GREEN - (INDOCYANINE FOR INJECTION)
1.2500 mg | Freq: Once | INTRAMUSCULAR | Status: AC
  Administered 2024-01-14: 1.25 mg via INTRAVENOUS
  Filled 2024-01-14: qty 10

## 2024-01-14 MED ORDER — MIDAZOLAM HCL 5 MG/5ML IJ SOLN
INTRAMUSCULAR | Status: DC | PRN
  Administered 2024-01-14: 2 mg via INTRAVENOUS

## 2024-01-14 MED ORDER — ONDANSETRON HCL 4 MG PO TABS: 4.0000 mg | ORAL_TABLET | Freq: Three times a day (TID) | ORAL | 0 refills | Status: DC | PRN

## 2024-01-14 MED ORDER — PROPOFOL 1000 MG/100ML IV EMUL
INTRAVENOUS | Status: AC
  Filled 2024-01-14: qty 100

## 2024-01-14 MED ORDER — DROPERIDOL 2.5 MG/ML IJ SOLN: 0.6250 mg | Freq: Once | INTRAMUSCULAR | Status: DC | PRN

## 2024-01-14 MED ORDER — GLYCOPYRROLATE 0.2 MG/ML IJ SOLN
INTRAMUSCULAR | Status: DC | PRN
  Administered 2024-01-14: .2 mg via INTRAVENOUS

## 2024-01-14 MED ORDER — ONDANSETRON HCL 4 MG/2ML IJ SOLN
INTRAMUSCULAR | Status: AC
Start: 2024-01-14 — End: 2024-01-14
  Filled 2024-01-14: qty 2

## 2024-01-14 MED ORDER — EPHEDRINE 5 MG/ML INJ
INTRAVENOUS | Status: AC
  Filled 2024-01-14: qty 5

## 2024-01-14 MED ORDER — OXYCODONE HCL 5 MG/5ML PO SOLN: 5.0000 mg | Freq: Once | ORAL | Status: AC | PRN

## 2024-01-14 SURGICAL SUPPLY — 43 items
APPLICATOR ARISTA FLEXITIP XL (MISCELLANEOUS) IMPLANT
BAG COUNTER SPONGE SURGICOUNT (BAG) IMPLANT
CABLE HIGH FREQUENCY MONO STRZ (ELECTRODE) ×2 IMPLANT
CHLORAPREP W/TINT 26 (MISCELLANEOUS) ×2 IMPLANT
CLIP APPLIE 5 13 M/L LIGAMAX5 (MISCELLANEOUS) IMPLANT
CLIP APPLIE ROT 10 11.4 M/L (STAPLE) IMPLANT
CLIP LIGATING HEMO O LOK GREEN (MISCELLANEOUS) IMPLANT
CLSR STERI-STRIP ANTIMIC 1/2X4 (GAUZE/BANDAGES/DRESSINGS) IMPLANT
COVER MAYO STAND XLG (MISCELLANEOUS) IMPLANT
COVER SURGICAL LIGHT HANDLE (MISCELLANEOUS) ×2 IMPLANT
DERMABOND ADVANCED .7 DNX12 (GAUZE/BANDAGES/DRESSINGS) IMPLANT
DRAPE C-ARM 42X120 X-RAY (DRAPES) IMPLANT
DRSG TEGADERM 2-3/8X2-3/4 SM (GAUZE/BANDAGES/DRESSINGS) ×6 IMPLANT
DRSG TEGADERM 4X4.75 (GAUZE/BANDAGES/DRESSINGS) ×2 IMPLANT
ELECT REM PT RETURN 15FT ADLT (MISCELLANEOUS) ×2 IMPLANT
GAUZE SPONGE 2X2 8PLY STRL LF (GAUZE/BANDAGES/DRESSINGS) ×2 IMPLANT
GLOVE BIO SURGEON STRL SZ7.5 (GLOVE) ×2 IMPLANT
GLOVE INDICATOR 8.0 STRL GRN (GLOVE) ×2 IMPLANT
GOWN STRL REUS W/ TWL XL LVL3 (GOWN DISPOSABLE) ×2 IMPLANT
GRASPER SUT TROCAR 14GX15 (MISCELLANEOUS) IMPLANT
HEMOSTAT ARISTA ABSORB 3G PWDR (HEMOSTASIS) IMPLANT
HEMOSTAT SNOW SURGICEL 2X4 (HEMOSTASIS) IMPLANT
IRRIGATION SUCT STRKRFLW 2 WTP (MISCELLANEOUS) ×2 IMPLANT
KIT BASIN OR (CUSTOM PROCEDURE TRAY) ×2 IMPLANT
KIT TURNOVER KIT A (KITS) ×2 IMPLANT
LHOOK LAP DISP 36CM (ELECTROSURGICAL) IMPLANT
POUCH RETRIEVAL ECOSAC 10 (ENDOMECHANICALS) ×2 IMPLANT
SCISSORS LAP 5X35 DISP (ENDOMECHANICALS) ×2 IMPLANT
SET CHOLANGIOGRAPH MIX (MISCELLANEOUS) IMPLANT
SET TUBE SMOKE EVAC HIGH FLOW (TUBING) ×2 IMPLANT
SLEEVE ADV FIXATION 5X100MM (TROCAR) ×2 IMPLANT
SPIKE FLUID TRANSFER (MISCELLANEOUS) ×2 IMPLANT
STRIP CLOSURE SKIN 1/2X4 (GAUZE/BANDAGES/DRESSINGS) ×2 IMPLANT
SUT MNCRL AB 4-0 PS2 18 (SUTURE) ×2 IMPLANT
SUT VIC AB 0 UR5 27 (SUTURE) IMPLANT
SUT VICRYL 0 TIES 12 18 (SUTURE) IMPLANT
SUT VICRYL 0 UR6 27IN ABS (SUTURE) IMPLANT
TOWEL OR 17X26 10 PK STRL BLUE (TOWEL DISPOSABLE) ×2 IMPLANT
TRAY LAPAROSCOPIC (CUSTOM PROCEDURE TRAY) ×2 IMPLANT
TROCAR ADV FIXATION 12X100MM (TROCAR) IMPLANT
TROCAR ADV FIXATION 5X100MM (TROCAR) ×2 IMPLANT
TROCAR BALLN 12MMX100 BLUNT (TROCAR) IMPLANT
TROCAR XCEL NON-BLD 5MMX100MML (ENDOMECHANICALS) IMPLANT

## 2024-01-14 NOTE — Op Note (Signed)
 Nichole Manning [id number] [birth year] 01/14/2024  Laparoscopic Cholecystectomy with near infrared fluorescent cholangiography procedure Note  Indications: This patient presents with postprandial abdominal pain mainly periumbilical associated with nausea.  Patient had had CT scan, blood work, HIDA scan and ultrasound.  She was found to have cholelithiasis.  Please see outside note for additional details Pre-operative Diagnosis: Postprandial abdominal pain, possible symptomatic cholelithiasis  Post-operative Diagnosis: Same  Surgeon: [name] MD FACS  Assistants: None  Anesthesia: General endotracheal anesthesia  Procedure Details  The patient was seen again in the Holding Room. The risks, benefits, complications, treatment options, and expected outcomes were discussed with the patient. The possibilities of reaction to medication, pulmonary aspiration, perforation of viscus, bleeding, recurrent infection, finding a normal gallbladder, the need for additional procedures, failure to diagnose a condition, the possible need to convert to an open procedure, and creating a complication requiring transfusion or operation were discussed with the patient. The likelihood of improving the patient's symptoms with return to their baseline status is good.  The patient and/or family concurred with the proposed plan, giving informed consent. The site of surgery properly noted. The patient was taken to Operating Room, identified as Nichole Manning and the procedure verified as Laparoscopic Cholecystectomy with ICG dye.  A Time Out was held and the above information confirmed. Antibiotic prophylaxis was administered.    ICG dye was administered preoperatively.    General endotracheal anesthesia was then administered and tolerated well. After the induction, the abdomen was prepped with Chloraprep and draped in the sterile fashion. The patient was positioned in the supine position.  Access to the abdomen was  obtained via the Optiview technique.  The patient had had a previous abdominoplasty earlier this year and had a reimplantation of her umbilicus so therefore I decided to avoid cutting down on her near the umbilicus.  A small incision was made to the left of the midline just below the subcostal margin.  Then using a 0 degree 5 mm laparoscope through a 5 mm trocar the laparoscope was advanced through all layers of the abdominal wall and carefully entered the abdominal cavity.  Pneumoperitoneum was smoothly established up to a patient pressure of 15 mmHg without any change in patient vital signs.  The laparoscope was advanced in the abdominal cavity was surveilled.  There is no evidence of injury to surrounding structures.  There was some evidence of insufflation of perigastric tissue along the lesser curvature.  We positioned the patient in reverse Trendelenburg, tilted slightly to the patient's left.  A 5 mm port was placed in the umbilical position under direct visualization.  The optical entry trocar was exchanged for a 12 mm trocar.  Two 5-mm ports were placed in the right upper quadrant. All skin incisions were infiltrated with a local anesthetic agent before making the incision and placing the trocars.   I then inspected the perigastric area along the lesser curve in the antrum.  There was no evidence of gastrotomy or injury to surrounding structure.  There was no hematoma.  I incised some of the tissue along the lesser curve with hook electrocautery and inspected in that area.  There is no evidence of hematoma or injury.    The gallbladder was identified, the fundus grasped and retracted cephalad. Adhesions were lysed bluntly and with the electrocautery where indicated, taking care not to injure any adjacent organs or viscus. The infundibulum was grasped and retracted laterally, exposing the peritoneum overlying the triangle of Calot. This was then  divided and exposed in a blunt fashion. A critical view  of the cystic duct and cystic artery was obtained.  The cystic duct was clearly identified and bluntly dissected circumferentially.  Utilizing the Stryker camera system near infrared fluorescent activity was visualized in the liver, cystic duct, common hepatic duct and common bile duct & small bowel.  This served as a secondary confirmation of our anatomy.  The cystic duct was then ligated with Hem-o-lok clips and divided. The cystic artery which had been identified & dissected free was ligated with Heema lock clips and divided as well.   The gallbladder was dissected from the liver bed in retrograde fashion with the electrocautery. The gallbladder was removed and placed in an Ecco sac.  The gallbladder and Ecco sac were then removed through the left upper quadrant port site. The liver bed was irrigated and inspected. Hemostasis was achieved with the electrocautery. Copious irrigation was utilized and was repeatedly aspirated until clear.  I reinspected the optical entry site and again found no evidence of injury to surrounding viscera.  I had previously inspected the periumbilical area and saw no adhesions to the anterior abdominal wall.  Patient did have some evidence of stool burden in her colon.  We again inspected the right upper quadrant for hemostasis.  The left upper quadrant port site was closed with two 0 Vicryl using a PMI suture passer with laparoscopic guidance.  The left upper quadrant closure was inspected and there was no air leak and nothing trapped within the closure. Pneumoperitoneum was released as we removed the trocars.  4-0 Monocryl was used to close the skin.    steri-strips, and clean dressings were applied. The patient was then extubated and brought to the recovery room in stable condition. Instrument, sponge, and needle counts were correct at closure and at the conclusion of the case.   Findings: Cholelithiasis Positive critical view No anterior abdominal wall  adhesions evidence of stool burden throughout the colon  Estimated Blood Loss: Minimal         Drains: none         Specimens: Gallbladder           Complications: None; patient tolerated the procedure well.         Disposition: PACU - hemodynamically stable.         Condition: stable  Camellia HERO. Tanda, MD, FACS General, Bariatric, & Minimally Invasive Surgery [facility],  [facility]

## 2024-01-14 NOTE — Anesthesia Preprocedure Evaluation (Addendum)
 Anesthesia Evaluation  Patient identified by MRN, date of birth, ID band Patient awake    Reviewed: Allergy & Precautions, H&P , NPO status , Patient's Chart, lab work & pertinent test results, reviewed documented beta blocker date and time   History of Anesthesia Complications (+) PONV and history of anesthetic complications  Airway Mallampati: II  TM Distance: >3 FB Neck ROM: full    Dental  (+) Dental Advisory Given, Teeth Intact   Pulmonary shortness of breath, former smoker   breath sounds clear to auscultation       Cardiovascular negative cardio ROS  Rhythm:Regular Rate:Normal     Neuro/Psych  Headaches PSYCHIATRIC DISORDERS Anxiety Depression       GI/Hepatic Neg liver ROS,GERD  Controlled and Medicated,,appendicitis   Endo/Other  negative endocrine ROSHypothyroidism    Renal/GU negative Renal ROS     Musculoskeletal   Abdominal  (+) - obese  Peds  Hematology negative hematology ROS (+)   Anesthesia Other Findings   Reproductive/Obstetrics                              Anesthesia Physical Anesthesia Plan  ASA: 2  Anesthesia Plan: General   Post-op Pain Management: Tylenol  PO (pre-op)*   Induction: Intravenous  PONV Risk Score and Plan: 2 and Ondansetron , Dexamethasone , Midazolam , Treatment may vary due to age or medical condition, Propofol  infusion, TIVA and Scopolamine  patch - Pre-op  Airway Management Planned: Oral ETT  Additional Equipment:   Intra-op Plan:   Post-operative Plan: Extubation in OR  Informed Consent: I have reviewed the patients History and Physical, chart, labs and discussed the procedure including the risks, benefits and alternatives for the proposed anesthesia with the patient or authorized representative who has indicated his/her understanding and acceptance.     Dental advisory given  Plan Discussed with: CRNA  Anesthesia Plan Comments:           Anesthesia Quick Evaluation

## 2024-01-14 NOTE — Transfer of Care (Signed)
 Immediate Anesthesia Transfer of Care Note  Patient: Nichole Manning  Procedure(s) Performed: LAPAROSCOPIC CHOLECYSTECTOMY  Patient Location: PACU  Anesthesia Type:General  Level of Consciousness: awake, drowsy, and patient cooperative  Airway & Oxygen Therapy: Patient Spontanous Breathing and Patient connected to face mask oxygen  Post-op Assessment: Report given to RN and Post -op Vital signs reviewed and stable  Post vital signs: Reviewed and stable  Last Vitals:  Vitals Value Taken Time  BP 128/91 01/14/24 12:32  Temp    Pulse 78 01/14/24 12:33  Resp 21 01/14/24 12:33  SpO2 100 % 01/14/24 12:33  Vitals shown include unfiled device data.  Last Pain:  Vitals:   01/14/24 0946  TempSrc:   PainSc: 0-No pain         Complications: No notable events documented.

## 2024-01-14 NOTE — Anesthesia Procedure Notes (Signed)
 Procedure Name: Intubation Date/Time: 01/14/2024 11:18 AM  Performed by: Nada Corean CROME, CRNAPre-anesthesia Checklist: Emergency Drugs available, Patient identified, Suction available, Timeout performed and Patient being monitored Patient Re-evaluated:Patient Re-evaluated prior to induction Oxygen Delivery Method: Circle system utilized Preoxygenation: Pre-oxygenation with 100% oxygen Induction Type: IV induction Ventilation: Mask ventilation without difficulty Laryngoscope Size: Mac and 3 Grade View: Grade I Tube type: Oral Tube size: 6.5 mm Number of attempts: 1 Airway Equipment and Method: Stylet Placement Confirmation: ETT inserted through vocal cords under direct vision, positive ETCO2 and breath sounds checked- equal and bilateral Secured at: 20 cm Tube secured with: Tape Dental Injury: Teeth and Oropharynx as per pre-operative assessment

## 2024-01-14 NOTE — Interval H&P Note (Signed)
 History and Physical Interval Note:  01/14/2024 10:53 AM  Nichole Manning  has presented today for surgery, with the diagnosis of cholelithiasis.  The various methods of treatment have been discussed with the patient and family. After consideration of risks, benefits and other options for treatment, the patient has consented to  Procedure(s) with comments: LAPAROSCOPIC CHOLECYSTECTOMY (N/A) - LAPAROSCOPIC CHOLECYSTECTOMY WITH ICG as a surgical intervention.  The patient's history has been reviewed, patient examined, no change in status, stable for surgery.  I have reviewed the patient's chart and labs.  Questions were answered to the patient's satisfaction.     [name]

## 2024-01-14 NOTE — Anesthesia Postprocedure Evaluation (Signed)
 Anesthesia Post Note  Patient: Nichole Manning  Procedure(s) Performed: LAPAROSCOPIC CHOLECYSTECTOMY     Patient location during evaluation: PACU Anesthesia Type: General Level of consciousness: sedated and patient cooperative Pain management: pain level controlled Vital Signs Assessment: post-procedure vital signs reviewed and stable Respiratory status: spontaneous breathing Cardiovascular status: stable Anesthetic complications: no   No notable events documented.  Last Vitals:  Vitals:   01/14/24 1330 01/14/24 1335  BP: 110/77   Pulse: 73   Resp: 13   Temp:    SpO2: 97% 95%    Last Pain:  Vitals:   01/14/24 1335  TempSrc:   PainSc: 3                  Norleen Pope

## 2024-01-14 NOTE — H&P (Signed)
 REFERRING PHYSICIAN: Gennaro Bouchard, DO  PROVIDER: Sully Manzi DARALYN BLUSH, MD  MRN: [id number] DOB: 12-Nov-1965 DATE OF ENCOUNTER: 01/06/2024  Subjective  Chief Complaint: new patient (Calculus of gallbladder w/o cholecystitis )   History of Present Illness: Nichole Manning is a 58 y.o. female who is seen today as an office consultation at the request of Dr. Gennaro for evaluation of new patient (Calculus of gallbladder w/o cholecystitis ) . History of Present Illness Nichole Manning is a 58 year old female with emphysema who presents with severe abdominal pain and nausea.  She has been experiencing severe abdominal pain since May 30th, described as 'labor-like' and unprecedented in intensity. The pain is primarily peri-umbilical, occasionally radiating to the back, and is constant with intermittent exacerbations resembling contractions. It is associated with nausea and worsens with food intake, though it can occur without eating.  She underwent a colonoscopy on May 15th and a tummy tuck on April 2nd. Multiple diagnostic tests, including a CT scan, blood work, a HIDA scan, and an ultrasound, have been performed. She was told that gallstones were found on her ultrasound. Despite these findings, her pain has progressively worsened.  She has experienced significant weight gain of approximately 15 pounds over two months despite reduced food intake. Previously on Ozempic, prescribed by her primary care provider, she has since discontinued it. She takes Linzess  for bowel movements, which she has been on for several months, but recently noted a decrease in bowel movement frequency.  She has a history of a bladder infection, which was treated, but the abdominal pain persisted. She also reports episodes of vomiting, primarily 'in her mouth,' and a weird taste in her mouth. No blood in stool, but her abdomen is bloated and hard.  Her past medical history includes a hysterectomy, appendectomy, and emphysema.  She previously smoked but has quit. Her birth parent had bile duct cancer.    Review of Systems: A complete review of systems was obtained from the patient. I have reviewed this information and discussed as appropriate with the patient. See HPI as well for other ROS.  ROS  Medical History: Past Medical History: Diagnosis Date Thyroid  disease  There is no problem list on file for this patient.  Past Surgical History: Procedure Laterality Date APPENDECTOMY HYSTERECTOMY   Allergies Allergen Reactions Amoxicillin Nausea and Vomiting Codeine Nausea and Vomiting  Current Outpatient Medications on File Prior to Visit Medication Sig Dispense Refill dicyclomine  (BENTYL ) 20 mg tablet Take 20 mg by mouth every 6 (six) hours estradioL (ESTRACE) 0.5 MG tablet Take 0.5 mg by mouth once daily levothyroxine  (SYNTHROID ) 88 MCG tablet Take 88 mcg by mouth once daily Take on an empty stomach with a glass of water at least 30-60 minutes before breakfast. minoxidiL 2.5 MG tablet Take 2.5 mg by mouth once daily progesterone (PROMETRIUM) 200 MG capsule Take 200 mg by mouth once daily rosuvastatin  (CRESTOR ) 5 MG tablet Take 5 mg by mouth once daily  No current facility-administered medications on file prior to visit.  Family History Problem Relation Age of Onset Breast cancer Mother   Social History  Tobacco Use Smoking Status Former Types: Cigarettes Smokeless Tobacco Never   Social History  Socioeconomic History Marital status: Legally Separated Tobacco Use Smoking status: Former Types: Cigarettes Smokeless tobacco: Never Substance and Sexual Activity Drug use: Never  Objective:  Vitals: 01/06/24 1020 BP: 103/77 Pulse: 80 Temp: 36.7 C (98 F) SpO2: 96% Weight: 61.2 kg (135 lb) Height: 165.1 cm (5' 5) PainSc: 0-No pain  Body  mass index is 22.47 kg/m. Chaperone equals Karilyn Constitutional: NAD; conversant; no deformities; seems a little bit uncomfortable Eyes:  Moist conjunctiva; no lid lag; anicteric; PERRL Neck: Trachea midline; no thyromegaly Lungs: Normal respiratory effort; no tactile fremitus CV: RRR; no palpable thrills; no pitting edema GI: Abd, nondistended, mild tenderness to palpation throughout, old tummy tuck scars; no palpable hepatosplenomegaly MSK: Normal gait; no clubbing/cyanosis Psychiatric: Appropriate affect; alert and oriented x3 Lymphatic: No palpable cervical or axillary lymphadenopathy Skin: No rash, lesions or jaundice  Labs, Imaging and Diagnostic Testing: ED note 12/31/23  CT a/p 12/31/23 IMPRESSION: Cholelithiasis and suggestion of fundal gallbladder adenomyomatosis. Recommend ultrasound for further confirmation.  Left kidney cortical cyst.  Subcentimeter segment 4 liver lesion, too small to characterize likely in cyst.  Right Upper Quadrant Abdominal Ultrasound 12/31/2023 08:47:03 PM  TECHNIQUE: Real-time ultrasonography of the right upper quadrant of the abdomen was performed.  COMPARISON: CT abdomen / pelvis earlier today.  CLINICAL HISTORY: RUQ pain.  FINDINGS:  LIVER: The liver demonstrates normal echogenicity. No intrahepatic biliary ductal dilatation. No evidence of mass.  BILIARY SYSTEM: Multiple layering gallstones, measuring up to 13 mm. No pericholecystic fluid or gallbladder wall thickening. Common bile duct measures 5 mm.  OTHER: No right upper quadrant ascites.  IMPRESSION: 1. Cholelithiasis, without associated sonographic findings to suggest acute cholecystitis.  12/24/23 HIDA IMPRESSION: 1. Patent cystic and common bile ducts.  2. Normal gallbladder ejection fraction.  CT angio a/p 11/27/23 IMPRESSION: VASCULAR  1. No acute vascular findings. No critical stenosis. 2. Aortic Atherosclerosis (ICD10-I70.0).  NON-VASCULAR  1. No acute intra-abdominal or intrapelvic process. 2. Distal colonic diverticulosis without diverticulitis.  CT chest 08/07/23 IMPRESSION: 1. No  evidence of interstitial lung disease. Previously described ground-glass opacity in the dependent lungs has resolved and was probably due to hypoventilatory change. No acute pulmonary disease. 2. Emphysema (ICD10-J43.9).   Upper endoscopy and colonoscopy Oct 28, 2023 along with pathology report  GI office note November 27, 2023  ED visit June 7 Assessment and Plan:   Diagnoses and all orders for this visit:  Calculus of gallbladder without cholecystitis without obstruction  Periumbilical abdominal pain    Assessment & Plan cholelithiasis Chronic abdominal pain since May 30th, primarily periumbilical, with associated nausea and weight gain. Imaging and tests revealed gallstones, but symptoms are atypical for gallbladder issues. Discussed the possibility of gallbladder removal, but cannot guarantee resolution of symptoms. Surgery is minimally invasive with potential side effects including loose stools postprandially. Discussed the risks and benefits of surgery, including the possibility of not resolving the pain but eliminating gallbladder as a differential. The likelihood of gallbladder infection over the next years is estimated at 10-15%. -Discussed pros and cons of cholecystectomy. Discussed that cholecystectomy may not ameliorate her abdominal pain. She would like to proceed with cholecystectomy to see if that helps. - Schedule gallbladder removal surgery. - Discuss with GI medicine team for further input. - Prescribe tramadol  for pain management. - Recommend Tylenol  1000 mg every 8 hours for pain. - Advise to return to ER if symptoms worsen.  We discussed gallbladder disease. The patient was given Agricultural engineer. We discussed non-operative and operative management. We discussed the signs & symptoms of acute cholecystitis  I discussed laparoscopic cholecystectomy with possible IOC in detail. The patient was given educational material as well as diagrams detailing the procedure.  We discussed the risks and benefits of a laparoscopic cholecystectomy including, but not limited to bleeding, infection, injury to surrounding structures such as the intestine or  liver, bile leak, retained gallstones, need to convert to an open procedure, prolonged diarrhea, blood clots such as DVT, common bile duct injury, anesthesia risks, and possible need for additional procedures. We discussed the typical post-operative recovery course. I explained that the likelihood of improvement of their symptoms is fair  Chronic abdominal pain of unclear etiology Persistent abdominal pain with nausea, bloating, and weight gain. Pain is constant with intermittent exacerbations. Differential includes IBS, delayed gastric emptying, or intestinal motility issues. Previous imaging and tests, including CT, HIDA scan, and endoscopy, were unremarkable except for gallstones. Discussed potential causes and treatment options, including medication adjustments and further GI workup. Consideration of gastric emptying study due to low likelihood of delayed gastric emptying from Ozempic. - Consider gastric emptying study if symptoms persist post-surgery. -Not sure some of her issues could be IBS-C/ colonic motility issues - Discuss with GI medicine team for further evaluation and management. This note has been created using automated tools and reviewed for accuracy by Dera Vanaken MCADAMS Charlcie Prisco.  This patient encounter took 47 minutes today to perform the following: take history, perform exam, review outside records, interpret imaging, counsel the patient on their diagnosis and document encounter, findings & plan in the EHR  No follow-ups on file.  Santina Trillo DARALYN BLUSH, MD General, Minimally Invasive, & Bariatric Surgery     Electronically signed by BLUSH Camellia DARALYN, MD at 01/06/2024 12:57 PM EDT

## 2024-01-15 ENCOUNTER — Encounter (HOSPITAL_COMMUNITY): Payer: Self-pay | Admitting: General Surgery

## 2024-01-15 LAB — SURGICAL PATHOLOGY

## 2024-01-29 ENCOUNTER — Other Ambulatory Visit: Payer: Self-pay

## 2024-01-29 ENCOUNTER — Emergency Department (HOSPITAL_BASED_OUTPATIENT_CLINIC_OR_DEPARTMENT_OTHER)

## 2024-01-29 ENCOUNTER — Encounter (HOSPITAL_BASED_OUTPATIENT_CLINIC_OR_DEPARTMENT_OTHER): Payer: Self-pay | Admitting: Emergency Medicine

## 2024-01-29 ENCOUNTER — Emergency Department (HOSPITAL_BASED_OUTPATIENT_CLINIC_OR_DEPARTMENT_OTHER)
Admission: EM | Admit: 2024-01-29 | Discharge: 2024-01-30 | Disposition: A | Discharge status: Home / Self Care | Attending: Emergency Medicine | Admitting: Emergency Medicine

## 2024-01-29 DIAGNOSIS — Z87891 Personal history of nicotine dependence: Secondary | ICD-10-CM | POA: Insufficient documentation

## 2024-01-29 DIAGNOSIS — R1030 Lower abdominal pain, unspecified: Secondary | ICD-10-CM | POA: Insufficient documentation

## 2024-01-29 DIAGNOSIS — E039 Hypothyroidism, unspecified: Secondary | ICD-10-CM | POA: Diagnosis not present

## 2024-01-29 DIAGNOSIS — M545 Low back pain, unspecified: Secondary | ICD-10-CM | POA: Diagnosis present

## 2024-01-29 DIAGNOSIS — R109 Unspecified abdominal pain: Secondary | ICD-10-CM | POA: Insufficient documentation

## 2024-01-29 LAB — CBC
HCT: 38.9 % (ref 36.0–46.0)
Hemoglobin: 13 g/dL (ref 12.0–15.0)
MCH: 32.3 pg (ref 26.0–34.0)
MCHC: 33.4 g/dL (ref 30.0–36.0)
MCV: 96.5 fL (ref 80.0–100.0)
Platelets: 263 K/uL (ref 150–400)
RBC: 4.03 MIL/uL (ref 3.87–5.11)
RDW: 12.7 % (ref 11.5–15.5)
WBC: 7.6 K/uL (ref 4.0–10.5)
nRBC: 0 % (ref 0.0–0.2)

## 2024-01-29 LAB — COMPREHENSIVE METABOLIC PANEL WITH GFR
ALT: 20 U/L (ref 0–44)
AST: 30 U/L (ref 15–41)
Albumin: 4 g/dL (ref 3.5–5.0)
Alkaline Phosphatase: 75 U/L (ref 38–126)
Anion gap: 12 (ref 5–15)
BUN: 16 mg/dL (ref 6–20)
CO2: 24 mmol/L (ref 22–32)
Calcium: 9.1 mg/dL (ref 8.9–10.3)
Chloride: 103 mmol/L (ref 98–111)
Creatinine, Ser: 0.9 mg/dL (ref 0.44–1.00)
GFR, Estimated: >60 mL/min (ref >60)
Glucose, Bld: 113 mg/dL — ABNORMAL HIGH (ref 70–99)
Potassium: 4.6 mmol/L (ref 3.5–5.1)
Sodium: 139 mmol/L (ref 135–145)
Total Bilirubin: 0.2 mg/dL (ref 0.0–1.2)
Total Protein: 6.4 g/dL — ABNORMAL LOW (ref 6.5–8.1)

## 2024-01-29 LAB — URINALYSIS, ROUTINE W REFLEX MICROSCOPIC
Bilirubin Urine: NEGATIVE
Glucose, UA: NEGATIVE mg/dL
Hgb urine dipstick: NEGATIVE
Ketones, ur: NEGATIVE mg/dL
Leukocytes,Ua: NEGATIVE
Nitrite: NEGATIVE
Protein, ur: NEGATIVE mg/dL
Specific Gravity, Urine: 1.009 (ref 1.005–1.030)
pH: 5.5 (ref 5.0–8.0)

## 2024-01-29 LAB — LIPASE, BLOOD: Lipase: 53 U/L — ABNORMAL HIGH (ref 11–51)

## 2024-01-29 MED ORDER — KETOROLAC TROMETHAMINE 15 MG/ML IJ SOLN
15.0000 mg | Freq: Once | INTRAMUSCULAR | Status: AC
  Administered 2024-01-29: 15 mg via INTRAVENOUS
  Filled 2024-01-29: qty 1

## 2024-01-29 MED ORDER — ONDANSETRON HCL 4 MG/2ML IJ SOLN
4.0000 mg | Freq: Once | INTRAMUSCULAR | Status: AC
  Administered 2024-01-29: 4 mg via INTRAVENOUS
  Filled 2024-01-29: qty 2

## 2024-01-29 MED ORDER — SODIUM CHLORIDE 0.9 % IV BOLUS
1000.0000 mL | Freq: Once | INTRAVENOUS | Status: AC
  Administered 2024-01-29: 1000 mL via INTRAVENOUS

## 2024-01-29 MED ORDER — IOHEXOL 300 MG/ML  SOLN
100.0000 mL | Freq: Once | INTRAMUSCULAR | Status: AC | PRN
  Administered 2024-01-29: 85 mL via INTRAVENOUS

## 2024-01-29 NOTE — ED Triage Notes (Signed)
 Gallbladder removed 8/7. Having continued and worsening pain since 1 week after surgery. +N/-V/-D. Denies CP, SOB. Vision blurry several weeks as well. Denies dizziness.

## 2024-01-29 NOTE — ED Provider Notes (Signed)
 [facility] Emergency Department Provider Note MRN:  [id number]  Arrival date & time: 01/30/24     Chief Complaint   Back pain History of Present Illness   Nichole Manning is a 58 y.o. year-old female with a history of interstitial lung disease presenting to the ED with chief complaint of back pain.  Gallbladder surgery on August 9, has been having lower abdominal pain since then.  For the past week having right-sided low back pain as well, sent here by surgeon for evaluation.  No fever, not very many bowel movements, nausea but no vomiting.  Review of Systems  A thorough review of systems was obtained and all systems are negative except as noted in the HPI and PMH.   Patient's Health History    Past Medical History:  Diagnosis Date   Anxiety    Constipation    Fibroids    Headache    migraines   Hyperlipidemia    Hypothyroidism    ILD (interstitial lung disease) (HCC)    Palpitations    PONV (postoperative nausea and vomiting)    mild nausea per pt   PVC's (premature ventricular contractions)    SVD (spontaneous vaginal delivery)    x 3    Past Surgical History:  Procedure Laterality Date   ancillary surgery Bilateral    09/09/23 fat removal   APPENDECTOMY     laparoscopic   AUGMENTATION MAMMAPLASTY     BILATERAL SALPINGECTOMY Bilateral 07/01/2013   Procedure: BILATERAL SALPINGECTOMY;  Surgeon: [name], MD;  Location: [facility];  Service: Gynecology;  Laterality: Bilateral;   BLEPHAROPLASTY Bilateral    BREAST SURGERY     breast reduction x 2   CHOLECYSTECTOMY N/A 01/14/2024   Procedure: LAPAROSCOPIC CHOLECYSTECTOMY;  Surgeon: [name], MD;  Location: [facility];  Service: General;  Laterality: N/A;  LAPAROSCOPIC CHOLECYSTECTOMY WITH ICG   LAPAROSCOPIC APPENDECTOMY N/A 09/09/2020   Procedure: APPENDECTOMY LAPAROSCOPIC;  Surgeon: Kinsinger, Herlene Righter, MD;  Location: [facility] OR;  Service: General;  Laterality: N/A;   TUBAL LIGATION     Tummy  tuck     09/09/23   WISDOM TOOTH EXTRACTION      Family History  Adopted: Yes  Problem Relation Age of Onset   Breast cancer Mother 70   Cancer Father        bile duct cancer died in his 26's   Colon polyps Neg Hx    Colon cancer Neg Hx    Esophageal cancer Neg Hx    Stomach cancer Neg Hx    Rectal cancer Neg Hx     Social History   Socioeconomic History   Marital status: Married    Spouse name: Not on file   Number of children: Not on file   Years of education: Not on file   Highest education level: Not on file  Occupational History   Not on file  Tobacco Use   Smoking status: Former    Current packs/day: 1.50    Average packs/day: 1.5 packs/day for 30.0 years (45.0 ttl pk-yrs)    Types: Cigarettes   Smokeless tobacco: Never   Tobacco comments:    20 cigarettes per day 11/16/19 [facility]   Vaping Use   Vaping status: Former   Quit date: 10/08/2023  Substance and Sexual Activity   Alcohol use: Not Currently    Comment: social   Drug use: No   Sexual activity: Yes    Birth control/protection: Surgical  Other Topics Concern  Not on file  Social History Narrative   Divorced 1 son and 1 daughter   2-story home   Left handed   1 caffeinated beverage daily   She has been a Librarian, 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   There is a history of vaping   No alcohol   No drug use   Social Drivers of Corporate investment banker Strain: Not on file  Food Insecurity: Not on file  Transportation Needs: Not on file  Physical Activity: Not on file  Stress: Not on file  Social Connections: Not on file  Intimate Partner Violence: Not on file     Physical Exam   Vitals:   01/29/24 2156 01/29/24 2345  BP: 100/60 100/73  Pulse: 62 65  Resp: 17 16  Temp:    SpO2: 99% 100%    CONSTITUTIONAL: Well-appearing, NAD NEURO/PSYCH:  Alert and oriented x 3, no focal deficits EYES:  eyes equal and reactive ENT/NECK:  no LAD, no JVD CARDIO: Regular rate, well-perfused, normal S1 and  S2 PULM:  CTAB no wheezing or rhonchi GI/GU:  non-distended, non-tender MSK/SPINE:  No gross deformities, no edema SKIN:  no rash, atraumatic   *Additional and/or pertinent findings included in MDM below  Diagnostic and Interventional Summary    EKG Interpretation Date/Time:    Ventricular Rate:    PR Interval:    QRS Duration:    QT Interval:    QTC Calculation:   R Axis:      Text Interpretation:         Labs Reviewed  LIPASE, BLOOD - Abnormal; Notable for the following components:      Result Value   Lipase 53 (*)    All other components within normal limits  COMPREHENSIVE METABOLIC PANEL WITH GFR - Abnormal; Notable for the following components:   Glucose, Bld 113 (*)    Total Protein 6.4 (*)    All other components within normal limits  URINALYSIS, ROUTINE W REFLEX MICROSCOPIC - Abnormal; Notable for the following components:   Color, Urine COLORLESS (*)    All other components within normal limits  CBC    CT ABDOMEN PELVIS W CONTRAST  Final Result      Medications  ondansetron  (ZOFRAN ) injection 4 mg (4 mg Intravenous Given 01/29/24 2345)  ketorolac  (TORADOL ) 15 MG/ML injection 15 mg (15 mg Intravenous Given 01/29/24 2345)  sodium chloride  0.9 % bolus 1,000 mL (1,000 mLs Intravenous New Bag/Given 01/29/24 2347)  iohexol  (OMNIPAQUE ) 300 MG/ML solution 100 mL (85 mLs Intravenous Contrast Given 01/29/24 2334)     Procedures  /  Critical Care Procedures  ED Course and Medical Decision Making  Initial Impression and Ddx Differential diagnosis includes constipation, SBO, MSK back pain, kidney stone, bile leak or other postoperative complication.  Past medical/surgical history that increases complexity of ED encounter: Recent cholecystectomy  Interpretation of Diagnostics I personally reviewed the Laboratory Testing and my interpretation is as follows: No significant blood count or electrolyte disturbance.  CT unremarkable  Patient Reassessment and Ultimate  Disposition/Management     Patient feeling better on reassessment with normal vital signs, has no red flag symptoms to suggest myelopathy with regard to the lower back pain.  Appropriate for discharge.  Patient management required discussion with the following services or consulting groups:  None  Complexity of Problems Addressed Acute illness or injury that poses threat of life of bodily function  Additional Data Reviewed and Analyzed Further history obtained from: Further history from spouse/family member  Additional Factors Impacting ED Encounter Risk Prescriptions and Consideration of hospitalization  Ozell HERO. Theadore, MD [facility] Emergency Medicine [facility] [email or web address]  Final Clinical Impressions(s) / ED Diagnoses     ICD-10-CM   1. Acute right-sided low back pain without sciatica  M54.50     2. Abdominal pain, unspecified abdominal location  R10.9       ED Discharge Orders          Ordered    naproxen  (NAPROSYN ) 500 MG tablet  2 times daily        01/30/24 0018    methocarbamol  (ROBAXIN ) 500 MG tablet  Every 8 hours PRN        01/30/24 0018    lidocaine  (LIDODERM ) 5 %  Every 24 hours        01/30/24 0018             Discharge Instructions Discussed with and Provided to Patient:     Discharge Instructions      You were evaluated in the Emergency Department and after careful evaluation, we did not find any emergent condition requiring admission or further testing in the hospital.  Your exam/testing today is overall reassuring.  Symptoms may be due to muscular strain or spasm of the lower back.  Your CT scan did not show any significant abnormalities.  Recommend using the Naprosyn  twice daily as prescribed for pain.  Can use the Robaxin  muscle relaxer for more significant pain, best used at night if you are having trouble sleeping as it can cause drowsiness.  Can also use the numbing patches daily.  Recommend follow-up with your  primary care doctor and/or surgeon if your symptoms are not going away over the next week.  Please return to the Emergency Department if you experience any worsening of your condition.   Thank you for allowing us  to be a part of your care.       Theadore Ozell HERO, MD 01/30/24 [phone]

## 2024-01-30 MED ORDER — NAPROXEN 500 MG PO TABS: 500.0000 mg | ORAL_TABLET | Freq: Two times a day (BID) | ORAL | 0 refills | Status: DC

## 2024-01-30 MED ORDER — METHOCARBAMOL 500 MG PO TABS: 500.0000 mg | ORAL_TABLET | Freq: Three times a day (TID) | ORAL | 0 refills | Status: DC | PRN

## 2024-01-30 MED ORDER — LIDOCAINE 5 % EX PTCH: 1.0000 | MEDICATED_PATCH | CUTANEOUS | 0 refills | Status: DC

## 2024-01-30 NOTE — Discharge Instructions (Addendum)
 You were evaluated in the Emergency Department and after careful evaluation, we did not find any emergent condition requiring admission or further testing in the hospital.  Your exam/testing today is overall reassuring.  Symptoms may be due to muscular strain or spasm of the lower back.  Your CT scan did not show any significant abnormalities.  Recommend using the Naprosyn  twice daily as prescribed for pain.  Can use the Robaxin  muscle relaxer for more significant pain, best used at night if you are having trouble sleeping as it can cause drowsiness.  Can also use the numbing patches daily.  Recommend follow-up with your primary care doctor and/or surgeon if your symptoms are not going away over the next week.  Please return to the Emergency Department if you experience any worsening of your condition.   Thank you for allowing us  to be a part of your care.

## 2024-02-09 ENCOUNTER — Other Ambulatory Visit: Payer: Self-pay | Admitting: Medical Genetics

## 2024-02-10 ENCOUNTER — Encounter: Payer: Self-pay | Admitting: *Deleted

## 2024-02-11 NOTE — Progress Notes (Unsigned)
 Office Visit    Patient Name: Nichole Manning Date of Encounter: 02/12/2024  PCP:  Donal Channing SQUIBB, FNP   [facility]  Cardiologist:  Shelda Bruckner, MD  Advanced Practice Provider:  No care team member to display Electrophysiologist:  None }  HPI    Nichole Manning is a 58 y.o. female with a past medical history of anxiety, COPD, hypothyroidism, palpitations, PSVT, PAC, PVC, mild MR presents today for follow-up appointment.  She was last seen in the clinic by Raphael Bring, PA-C 07/04/2022.  History of mild LVH and mild MR on echocardiogram.  Seen for palpitations as well.  Monitor 8/21 with heart rate 55 to 144 bpm, average 79 bpm, predominantly NSR, 1 5 beat SVT, rare less than 1% PACs, PVCs, 2 triggered events with sinus with PVCs.  Lipids managed by primary care.  When she was last seen it was for a preop evaluation for elective blepharoplasty.  She was regularly seen by PCP and found to have an abnormal EKG.  EKG from the PCP visit showed normal sinus rhythm 83 bpm with frequent PVCs, nonspecific ST TW changes otherwise in precordial leads.  EKG at our appointment showed normal sinus rhythm with resolution of ST TW changes anteriorly and no further activity.  She reports occasional palpitations about 1 time per week but not particular bothersome.  Propranolol  helps but she does not take this often.  Noticed occasional dyspnea with exertion or showering.  No chest pain or syncope.  Thyroid  function is followed by endocrinology with lab work just 2 weeks prior therefore they were not repeated.  She was seen by me February 2024, she states she mailed her zio in a few weeks ago and has not heard anything. We reviewed her monitor with her. She has been on spirolactone for years for acne. She smoked for 40 years and quit in 2020. She has a 35 year old child and is worried about heart disease. She does not know her family history since she is adopted. We discussed  the need for an ischemic workup. She was to have a coronary CTA but she had too much ectopy to complete the test.   Ultimately, ETT was performed and she was able to achieve 9.2 METS and was negative.  Did not wish to add a beta-blocker.  On spironolactone  for acne and minoxidil for hair growth.  Requested sleep study due to fatigue which was negative.  She was seen in the ED 10/10/2022 with odd sensation over her left cheek and possible subtle facial droop.  Brain MRI was normal and she was advised to follow-up with her plastic surgeon.  She states she did so and she was told she may have Bell's palsy.  Since April she has been having some issues with her feet and palms sweating.  States she seen her PCP and endocrinologist and told it was possibly stress.  Prescribed a trial of hydroxyzine.  She does have stress at home with a teenage son.  Reports periodically feeling her PVCs described as a pounding sensation.  Walks her dog multiple times a week.  Sometimes is able to do so without any problems at all and other times notices SOB with activity which was described similarly in the past.  She was seen by me 02/2023, she tells me that she has had hypotension when she was seeing her pulmonary doctor. We discussed going down on her spirolactone and eventually discontinue it. She tells me her  recent Thyroid  panel was low: TSH 0.06, T3 59, T4 9.3. No medication changes were made at that time. She is having fatigue, dizzinesss and lightheadedness. Her son has insulin resistance so she tried to avoid high carbohydrate food and keeps an eye on her sugar. She struggles with migraines which manifests as broken lines across her vision.   I saw her December of last year, she presents with a history of smoking, COVID-19 infection, and palpitations with concerns about low blood pressure. The patient reports that her blood pressure has been running low, with readings as low as 102/72. The patient experiences occasional  lightheadedness and dizziness, which she attributes to the low blood pressure. The patient is currently on propranolol  for palpitations and Spiriva for lung issues.  The patient also reports weight gain after stopping spironolactone , which was initially prescribed for high cortisol levels. The patient expresses concern about the need for medication to manage her cortisol levels.  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 The patient reports using 72% of her lung capacity and is considering a lung biopsy. The patient denies experiencing any significant shortness of breath or other respiratory symptoms  Discussed the use of AI scribe software for clinical note transcription with the patient, who gave verbal consent to proceed.  Reports no shortness of breath nor dyspnea on exertion. Reports no chest pain, pressure, or tightness. No edema, orthopnea, PND. Reports no palpitations.  She was recently in the [facility] ED with lower abdominal pain since gallbladder surgery which took place on August 9.  ED visit was August 23.  She was having right sided low back pain and was sent by the surgeon for evaluation.  No fever, not many bowel movements, no nausea or vomiting at that time.  CT of the abdomen pelvis was ordered and showed colonic diverticulosis with no acute diverticulitis as well as aortic atherosclerosis.  Stomach was normal.  No acute abnormalities or complications from surgery noted.  Symptoms suggested myelopathy.  She was placed on naproxen  twice daily for pain and also offered Robaxin  as a muscle relaxer.  Today, she has a history of aortic atherosclerosis who presents with fatigue, swelling, and headaches.  She experiences persistent fatigue and lethargy since her gallbladder removal surgery on January 14, 2024, with poor sleep and feeling unrefreshed upon waking. She has gained weight and experiences shortness of  breath. Her aortic atherosclerosis was noted as severe in a CT scan from January 29, 2024. Her LDL cholesterol was 70 in March 2025, and she is on Crestor  5 mg daily.  She experiences palpitations and skipped heartbeats, particularly at night, which sometimes wake her up. She has a history of low blood pressure and was previously on spironolactone  for cortisol levels and acne for over twenty years. She was taken off spironolactone  due to low blood pressure but is considering restarting it for swelling. Her ankles are swollen, and she mentions no significant new medications except for a temporary prescription for stomach issues, which she does not take.  Reports no shortness of breath nor dyspnea on exertion. Reports no chest pain, pressure, or tightness. No orthopnea, PND.   Discussed the use of AI scribe software for clinical note transcription with the patient, who gave verbal consent to proceed.   Past Medical History    Past Medical History:  Diagnosis Date   Anxiety    Constipation    Fibroids    Headache  migraines   Hyperlipidemia    Hypothyroidism    ILD (interstitial lung disease) (HCC)    Palpitations    PONV (postoperative nausea and vomiting)    mild nausea per pt   PVC's (premature ventricular contractions)    SVD (spontaneous vaginal delivery)    x 3   Past Surgical History:  Procedure Laterality Date   ancillary surgery Bilateral    09/09/23 fat removal   APPENDECTOMY     laparoscopic   AUGMENTATION MAMMAPLASTY     BILATERAL SALPINGECTOMY Bilateral 07/01/2013   Procedure: BILATERAL SALPINGECTOMY;  Surgeon: [name], MD;  Location: [facility];  Service: Gynecology;  Laterality: Bilateral;   BLEPHAROPLASTY Bilateral    BREAST SURGERY     breast reduction x 2   CHOLECYSTECTOMY N/A 01/14/2024   Procedure: LAPAROSCOPIC CHOLECYSTECTOMY;  Surgeon: [name], MD;  Location: [facility];  Service: General;  Laterality: N/A;  LAPAROSCOPIC CHOLECYSTECTOMY WITH ICG    LAPAROSCOPIC APPENDECTOMY N/A 09/09/2020   Procedure: APPENDECTOMY LAPAROSCOPIC;  Surgeon: [name], MD;  Location: [facility];  Service: General;  Laterality: N/A;   TUBAL LIGATION     Tummy tuck     09/09/23   WISDOM TOOTH EXTRACTION      Allergies  Allergies  Allergen Reactions   Sulfa Antibiotics Hives   Amoxicillin Nausea Only   Codeine Nausea And Vomiting    EKGs/Labs/Other Studies Reviewed:   The following studies were reviewed today:  Echocardiogram 07/18/2022 IMPRESSIONS     1. Left ventricular ejection fraction, by estimation, is 60 to 65%. The  left ventricle has normal function. The left ventricle has no regional  wall motion abnormalities. Left ventricular diastolic parameters were  normal.   2. Right ventricular systolic function is normal. The right ventricular  size is normal.   3. The mitral valve is normal in structure. Trivial mitral valve  regurgitation. No evidence of mitral stenosis.   4. The aortic valve is tricuspid. Aortic valve regurgitation is not  visualized. No aortic stenosis is present.   5. The inferior vena cava is normal in size with greater than 50%  respiratory variability, suggesting right atrial pressure of 3 mmHg.   Comparison(s): No prior Echocardiogram.   FINDINGS   Left Ventricle: Left ventricular ejection fraction, by estimation, is 60  to 65%. The left ventricle has normal function. The left ventricle has no  regional wall motion abnormalities. The left ventricular internal cavity  size was normal in size. There is   no left ventricular hypertrophy. Left ventricular diastolic parameters  were normal.   Right Ventricle: The right ventricular size is normal. Right ventricular  systolic function is normal.   Left Atrium: Left atrial size was normal in size.   Right Atrium: Right atrial size was normal in size.   Pericardium: There is no evidence of pericardial effusion.   Mitral Valve: The mitral valve is normal in  structure. Trivial mitral  valve regurgitation. No evidence of mitral valve stenosis.   Tricuspid Valve: The tricuspid valve is normal in structure. Tricuspid  valve regurgitation is trivial. No evidence of tricuspid stenosis.   Aortic Valve: The aortic valve is tricuspid. Aortic valve regurgitation is  not visualized. No aortic stenosis is present.   Pulmonic Valve: The pulmonic valve was normal in structure. Pulmonic valve  regurgitation is trivial. No evidence of pulmonic stenosis.   Aorta: The aortic root is normal in size and structure.   Venous: The inferior vena cava is normal in size  with greater than 50%  respiratory variability, suggesting right atrial pressure of 3 mmHg.   IAS/Shunts: No atrial level shunt detected by color flow Doppler.    EKG:  EKG is not ordered today.    Recent Labs: 11/27/2023: TSH 2.38 01/29/2024: ALT 20; BUN 16; Creatinine, Ser 0.90; Hemoglobin 13.0; Platelets 263; Potassium 4.6; Sodium 139  Recent Lipid Panel    Component Value Date/Time   CHOL 161 08/21/2023 1548   TRIG 108 08/21/2023 1548   HDL 72 08/21/2023 1548   CHOLHDL 2.2 08/21/2023 1548   LDLCALC 70 08/21/2023 1548    Home Medications   Current Meds  Medication Sig   Clindamycin-Benzoyl Per, Refr, gel Apply 1 application topically daily.   cyanocobalamin (,VITAMIN B-12,) 1000 MCG/ML injection Inject 1,000 mcg into the muscle every 28 (twenty-eight) days.   Erenumab -aooe (AIMOVIG ) 140 MG/ML SOAJ Inject 140 mg into the skin every 28 (twenty-eight) days.   estradiol (ESTRACE) 0.5 MG tablet Take 0.5 mg by mouth daily.   hydrOXYzine (VISTARIL) 25 MG capsule Take 25-50 mg by mouth at bedtime as needed for anxiety.   levothyroxine  (SYNTHROID ) 88 MCG tablet Take 88 mcg by mouth daily before breakfast.   lidocaine  (LIDODERM ) 5 % Place 1 patch onto the skin daily. Remove & Discard patch within 12 hours or as directed by MD   linaclotide  (LINZESS ) 145 MCG CAPS capsule Take 1 capsule (145  mcg total) by mouth daily.   methocarbamol  (ROBAXIN ) 500 MG tablet Take 1 tablet (500 mg total) by mouth every 8 (eight) hours as needed for muscle spasms.   metoprolol  tartrate (LOPRESSOR ) 100 MG tablet Take 1 tablet (100 mg total) by mouth once for 1 dose.   minoxidil (LONITEN) 2.5 MG tablet Take 2.5 mg by mouth daily.   Multiple Vitamin (MULTIVITAMIN WITH MINERALS) TABS tablet Take 1 tablet by mouth daily.   naproxen  (NAPROSYN ) 500 MG tablet Take 1 tablet (500 mg total) by mouth 2 (two) times daily.   omeprazole  (PRILOSEC) 40 MG capsule Take 1 capsule (40 mg total) by mouth 2 (two) times daily.   ondansetron  (ZOFRAN ) 4 MG tablet Take 1 tablet (4 mg total) by mouth every 8 (eight) hours as needed for nausea or vomiting.   ondansetron  (ZOFRAN -ODT) 4 MG disintegrating tablet Take 1 tablet (4 mg total) by mouth every 8 (eight) hours as needed for nausea or vomiting.   oxyCODONE  (ROXICODONE ) 5 MG immediate release tablet Take 1 tablet (5 mg total) by mouth every 6 (six) hours as needed for severe pain (pain score 7-10).   OZEMPIC, 0.25 OR 0.5 MG/DOSE, 2 MG/3ML SOPN Inject 0.25 mg as directed once a week.   progesterone (PROMETRIUM) 200 MG capsule Take 200 mg by mouth at bedtime as needed (discomfort).   promethazine (PHENERGAN) 12.5 MG tablet Take 12.5 mg by mouth every 4 (four) hours as needed for nausea.   propranolol  ER (INDERAL  LA) 60 MG 24 hr capsule TAKE 1 CAPSULE BY MOUTH EVERY DAY   rosuvastatin  (CRESTOR ) 10 MG tablet Take 1 tablet (10 mg total) by mouth daily.   SPIRIVA RESPIMAT 2.5 MCG/ACT AERS Inhale 2 puffs into the lungs daily as needed (shortness of breath).   spironolactone  (ALDACTONE ) 25 MG tablet Take 1 tablet (25 mg total) by mouth daily.   tretinoin (RETIN-A) 0.05 % cream Apply 1 Application topically at bedtime.   venlafaxine XR (EFFEXOR-XR) 37.5 MG 24 hr capsule Take 37.5 mg by mouth at bedtime.   vitamin C (ASCORBIC ACID) 500 MG tablet  Take 500 mg by mouth daily.    [DISCONTINUED] rosuvastatin  (CRESTOR ) 5 MG tablet Take 1 tablet (5 mg total) by mouth daily.     Review of Systems      All other systems reviewed and are otherwise negative except as noted above.  Physical Exam    VS:  BP 112/70   Pulse 67   Ht 5' 5 (1.651 m)   Wt 136 lb 3.2 oz (61.8 kg)   SpO2 98%   BMI 22.66 kg/m  , BMI Body mass index is 22.66 kg/m.  Wt Readings from Last 3 Encounters:  02/12/24 136 lb 3.2 oz (61.8 kg)  01/14/24 134 lb 0.4 oz (60.8 kg)  01/08/24 134 lb (60.8 kg)     GEN: Well nourished, well developed, in no acute distress. HEENT: normal. Neck: Supple, no JVD, carotid bruits, or masses. Cardiac: RRR, no murmurs, rubs, or gallops. No clubbing, cyanosis, edema.  Radials/PT 2+ and equal bilaterally.  Respiratory:  Respirations regular and unlabored, clear to auscultation bilaterally. GI: Soft, nontender, nondistended. MS: No deformity or atrophy. Skin: Warm and dry, no rash. Neuro:  Strength and sensation are intact. Psych: Normal affect.  Assessment & Plan    Aortic atherosclerosis with severe plaque Severe atherosclerotic plaque in aorta and branches on CT scan from January 29, 2024. No symptoms of chest pain or significant coronary artery disease. Surgery not indicated without significant narrowing. - Order coronary CTA with contrast to assess coronary arteries and aortic atherosclerosis. - Consult with Dr. Lonni to review CT scan findings.  Hyperlipidemia in the setting of aortic atherosclerosis LDL cholesterol was 70 mg/dL in March 7974. Goal to reduce LDL to 55 mg/dL. Current Crestor  dose is low. - Increase Crestor  to 10 mg daily. - Recheck lipid panel in 8 weeks.  Lower extremity edema Reports of swollen ankles. No new medications contributing. Low blood pressure noted. Restarting spironolactone  considered for edema management. - Restart spironolactone  at 25 mg daily. - Recheck BMP in one week to monitor potassium levels. -update  echocardiogram  Fatigue and lethargy Persistent fatigue and lethargy post-gallbladder surgery. Poor sleep quality reported. Possible hypothyroidism contributing. - Order thyroid  panel including TSH, T4, and T3. - Referral to endocrinologist for further evaluation.  Hypothyroidism TSH was low in March 2025, now at 2.38. Current Synthroid  dose is 88 mcg. Symptoms of fatigue and weight gain could be related. - Order thyroid  panel including TSH, T4, and T3.  Palpitations and supraventricular tachycardia Reports of palpitations, especially at night. Negative stress test in February 2025. No current indication for invasive intervention. - Order echocardiogram to assess cardiac structure.  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 No recent lab work to assess current levels. Previous spironolactone  use for cortisol regulation. - Order cortisol level lab work - Refer to endocrinologist for further evaluation      Disposition: Follow up 3 months with Shelda Lonni, MD or APP.  Signed, Orren LOISE Fabry, PA-C 02/12/2024, 10:06 AM [facility]

## 2024-02-12 ENCOUNTER — Ambulatory Visit: Attending: Physician Assistant | Admitting: Physician Assistant

## 2024-02-12 ENCOUNTER — Encounter: Payer: Self-pay | Admitting: Physician Assistant

## 2024-02-12 VITALS — BP 112/70 | HR 67 | Ht 65.0 in | Wt 136.2 lb

## 2024-02-12 DIAGNOSIS — R002 Palpitations: Secondary | ICD-10-CM | POA: Diagnosis present

## 2024-02-12 DIAGNOSIS — R600 Localized edema: Secondary | ICD-10-CM | POA: Insufficient documentation

## 2024-02-12 DIAGNOSIS — I471 Supraventricular tachycardia, unspecified: Secondary | ICD-10-CM | POA: Insufficient documentation

## 2024-02-12 DIAGNOSIS — I700 Atherosclerosis of aorta: Secondary | ICD-10-CM | POA: Insufficient documentation

## 2024-02-12 DIAGNOSIS — E785 Hyperlipidemia, unspecified: Secondary | ICD-10-CM | POA: Diagnosis present

## 2024-02-12 DIAGNOSIS — I959 Hypotension, unspecified: Secondary | ICD-10-CM | POA: Insufficient documentation

## 2024-02-12 DIAGNOSIS — J849 Interstitial pulmonary disease, unspecified: Secondary | ICD-10-CM | POA: Insufficient documentation

## 2024-02-12 MED ORDER — METOPROLOL TARTRATE 100 MG PO TABS: 100.0000 mg | ORAL_TABLET | Freq: Once | ORAL | 0 refills | Status: DC

## 2024-02-12 MED ORDER — ROSUVASTATIN CALCIUM 10 MG PO TABS: 10.0000 mg | ORAL_TABLET | Freq: Every day | ORAL | 3 refills | Status: AC

## 2024-02-12 MED ORDER — SPIRONOLACTONE 25 MG PO TABS: 25.0000 mg | ORAL_TABLET | Freq: Every day | ORAL | 3 refills | Status: DC

## 2024-02-12 NOTE — Addendum Note (Signed)
 Addended by: LUCIEN ORREN SAILOR on: 02/12/2024 05:46 PM   Modules accepted: Orders

## 2024-02-12 NOTE — Patient Instructions (Addendum)
 Medication Instructions:  Increase Crestor  10 mg take one tablet daily  Start Spironolactone  25 mg take one tablet daily *If you need a refill on your cardiac medications before your next appointment, please call your pharmacy*  Lab Work: Today TSH, T4, T3, Cortisol Lipids in 8 weeks (04/08/24) BMP in 1 week (02/19/24)  If you have labs (blood work) drawn today and your tests are completely normal, you will receive your results only by: MyChart Message (if you have MyChart) OR A paper copy in the mail If you have any lab test that is abnormal or we need to change your treatment, we will call you to review the results.  Testing/Procedures:   Your cardiac CT will be scheduled at one of the below locations:    [facility] [address]  [place], [place] [address] [phone]  If scheduled at the [facility] at [facility], please enter the parking lot using the [address] entrance and use the FREE valet service at the patient drop-off area. Enter the building and check-in with registration on the main floor.  Please follow these instructions carefully (unless otherwise directed):  An IV will be required for this test and Nitroglycerin  will be given.  Hold all erectile dysfunction medications at least 3 days (72 hrs) prior to test. (Ie viagra, cialis, sildenafil, tadalafil, etc)   On the Night Before the Test: Be sure to Drink plenty of water. Do not consume any caffeinated/decaffeinated beverages or chocolate 12 hours prior to your test. Do not take any antihistamines 12 hours prior to your test.  On the Day of the Test: Drink plenty of water until 1 hour prior to the test. Do not eat any food 1 hour prior to test. You may take your regular medications prior to the test.  Take metoprolol  (Lopressor ) two hours prior to test. If you take Furosemide/Hydrochlorothiazide/Spironolactone /Chlorthalidone, please HOLD on the morning  of the test. Patients who wear a continuous glucose monitor MUST remove the device prior to scanning. FEMALES- please wear underwire-free bra if available, avoid dresses & tight clothing  After the Test: Drink plenty of water. After receiving IV contrast, you may experience a mild flushed feeling. This is normal. On occasion, you may experience a mild rash up to 24 hours after the test. This is not dangerous. If this occurs, you can take Benadryl  25 mg, Zyrtec, Claritin, or Allegra and increase your fluid intake. (Patients taking Tikosyn should avoid Benadryl , and may take Zyrtec, Claritin, or Allegra) If you experience trouble breathing, this can be serious. If it is severe call [phone] IMMEDIATELY. If it is mild, please call our office.  We will call to schedule your test 2-4 weeks out understanding that some insurance companies will need an authorization prior to the service being performed.   For more information and frequently asked questions, please visit our website : [email or web address]  For non-scheduling related questions, please contact the cardiac imaging nurse navigator should you have any questions/concerns: Cardiac Imaging Nurse Navigators Direct Office Dial: [phone]   For scheduling needs, including cancellations and rescheduling, please call [place], [phone].    Your physician has requested that you have an echocardiogram. Echocardiography is a painless test that uses sound waves to create images of your heart. It provides your doctor with information about the size and shape of your heart and how well your heart's chambers and valves are working. This procedure takes approximately one hour. There are no restrictions for  this procedure. Please do NOT wear cologne, perfume, aftershave, or lotions (deodorant is allowed). Please arrive 15 minutes prior to your appointment time.  Please note: We ask at that you not bring children with you during ultrasound  (echo/ vascular) testing. Due to room size and safety concerns, children are not allowed in the ultrasound rooms during exams. Our front office staff cannot provide observation of children in our lobby area while testing is being conducted. An adult accompanying a patient to their appointment will only be allowed in the ultrasound room at the discretion of the ultrasound technician under special circumstances. We apologize for any inconvenience.   Follow-Up: At [facility], you and your health needs are our priority.  As part of our continuing mission to provide you with exceptional heart care, our providers are all part of one team.  This team includes your primary Cardiologist (physician) and Advanced Practice Providers or APPs (Physician Assistants and Nurse Practitioners) who all work together to provide you with the care you need, when you need it.  Your next appointment:   2-3 month(s)  Provider:   Orren Fabry, PA-C

## 2024-02-13 LAB — TSH: TSH: 1.07 u[IU]/mL (ref 0.450–4.500)

## 2024-02-13 LAB — CORTISOL: Cortisol: 16.9 ug/dL (ref 6.2–19.4)

## 2024-02-13 LAB — T3, FREE: T3, Free: 2.5 pg/mL (ref 2.0–4.4)

## 2024-02-13 LAB — T4, FREE: Free T4: 1.7 ng/dL (ref 0.82–1.77)

## 2024-02-14 ENCOUNTER — Ambulatory Visit: Payer: Self-pay | Admitting: Physician Assistant

## 2024-02-17 ENCOUNTER — Telehealth (HOSPITAL_COMMUNITY): Payer: Self-pay | Admitting: *Deleted

## 2024-02-17 NOTE — Telephone Encounter (Signed)

## 2024-02-18 ENCOUNTER — Ambulatory Visit (HOSPITAL_COMMUNITY)
Admission: RE | Admit: 2024-02-18 | Discharge: 2024-02-18 | Disposition: A | Discharge status: Home / Self Care | Source: Ambulatory Visit | Attending: Physician Assistant | Admitting: Physician Assistant

## 2024-02-18 DIAGNOSIS — I700 Atherosclerosis of aorta: Secondary | ICD-10-CM | POA: Diagnosis present

## 2024-02-18 MED ORDER — NITROGLYCERIN 0.4 MG SL SUBL
0.8000 mg | SUBLINGUAL_TABLET | Freq: Once | SUBLINGUAL | Status: AC
  Administered 2024-02-18: 0.8 mg via SUBLINGUAL

## 2024-02-18 MED ORDER — IOHEXOL 350 MG/ML SOLN
100.0000 mL | Freq: Once | INTRAVENOUS | Status: AC | PRN
  Administered 2024-02-18: 100 mL via INTRAVENOUS

## 2024-02-18 MED ORDER — SODIUM CHLORIDE 0.9 % IV SOLN: Freq: Once | INTRAVENOUS | Status: AC

## 2024-02-19 ENCOUNTER — Other Ambulatory Visit: Payer: Self-pay

## 2024-02-19 DIAGNOSIS — Z79899 Other long term (current) drug therapy: Secondary | ICD-10-CM

## 2024-02-19 DIAGNOSIS — E785 Hyperlipidemia, unspecified: Secondary | ICD-10-CM

## 2024-02-19 LAB — LIPID PANEL
Chol/HDL Ratio: 2 ratio (ref 0.0–4.4)
Cholesterol, Total: 137 mg/dL (ref 100–199)
HDL: 69 mg/dL (ref >39)
LDL Chol Calc (NIH): 40 mg/dL (ref 0–99)
Triglycerides: 176 mg/dL — ABNORMAL HIGH (ref 0–149)
VLDL Cholesterol Cal: 28 mg/dL (ref 5–40)

## 2024-02-19 LAB — BASIC METABOLIC PANEL WITH GFR
BUN/Creatinine Ratio: 21 (ref 9–23)
BUN: 15 mg/dL (ref 6–24)
CO2: 22 mmol/L (ref 20–29)
Calcium: 8.5 mg/dL — ABNORMAL LOW (ref 8.7–10.2)
Chloride: 105 mmol/L (ref 96–106)
Creatinine, Ser: 0.7 mg/dL (ref 0.57–1.00)
Glucose: 73 mg/dL (ref 70–99)
Potassium: 4.6 mmol/L (ref 3.5–5.2)
Sodium: 139 mmol/L (ref 134–144)
eGFR: 100 mL/min/1.73 (ref >59)

## 2024-02-23 ENCOUNTER — Other Ambulatory Visit

## 2024-02-26 ENCOUNTER — Emergency Department (HOSPITAL_BASED_OUTPATIENT_CLINIC_OR_DEPARTMENT_OTHER)
Admission: EM | Admit: 2024-02-26 | Discharge: 2024-02-26 | Disposition: A | Discharge status: Home / Self Care | Source: Ambulatory Visit | Attending: Emergency Medicine | Admitting: Emergency Medicine

## 2024-02-26 ENCOUNTER — Telehealth: Payer: Self-pay | Admitting: Gastroenterology

## 2024-02-26 ENCOUNTER — Encounter (HOSPITAL_BASED_OUTPATIENT_CLINIC_OR_DEPARTMENT_OTHER): Payer: Self-pay | Admitting: Emergency Medicine

## 2024-02-26 ENCOUNTER — Emergency Department (HOSPITAL_BASED_OUTPATIENT_CLINIC_OR_DEPARTMENT_OTHER)

## 2024-02-26 ENCOUNTER — Other Ambulatory Visit: Payer: Self-pay

## 2024-02-26 DIAGNOSIS — K5900 Constipation, unspecified: Secondary | ICD-10-CM | POA: Insufficient documentation

## 2024-02-26 DIAGNOSIS — R1084 Generalized abdominal pain: Secondary | ICD-10-CM

## 2024-02-26 LAB — CBC
HCT: 43.7 % (ref 36.0–46.0)
Hemoglobin: 14.6 g/dL (ref 12.0–15.0)
MCH: 32.3 pg (ref 26.0–34.0)
MCHC: 33.4 g/dL (ref 30.0–36.0)
MCV: 96.7 fL (ref 80.0–100.0)
Platelets: 257 K/uL (ref 150–400)
RBC: 4.52 MIL/uL (ref 3.87–5.11)
RDW: 12.6 % (ref 11.5–15.5)
WBC: 10 K/uL (ref 4.0–10.5)
nRBC: 0 % (ref 0.0–0.2)

## 2024-02-26 LAB — COMPREHENSIVE METABOLIC PANEL WITH GFR
ALT: 27 U/L (ref 0–44)
AST: 32 U/L (ref 15–41)
Albumin: 4.2 g/dL (ref 3.5–5.0)
Alkaline Phosphatase: 73 U/L (ref 38–126)
Anion gap: 11 (ref 5–15)
BUN: 16 mg/dL (ref 6–20)
CO2: 25 mmol/L (ref 22–32)
Calcium: 9.3 mg/dL (ref 8.9–10.3)
Chloride: 104 mmol/L (ref 98–111)
Creatinine, Ser: 0.97 mg/dL (ref 0.44–1.00)
GFR, Estimated: >60 mL/min (ref >60)
Glucose, Bld: 94 mg/dL (ref 70–99)
Potassium: 3.9 mmol/L (ref 3.5–5.1)
Sodium: 140 mmol/L (ref 135–145)
Total Bilirubin: 0.3 mg/dL (ref 0.0–1.2)
Total Protein: 6.7 g/dL (ref 6.5–8.1)

## 2024-02-26 LAB — URINALYSIS, ROUTINE W REFLEX MICROSCOPIC
Bilirubin Urine: NEGATIVE
Glucose, UA: NEGATIVE mg/dL
Hgb urine dipstick: NEGATIVE
Ketones, ur: 15 mg/dL — AB
Leukocytes,Ua: NEGATIVE
Nitrite: NEGATIVE
Protein, ur: NEGATIVE mg/dL
Specific Gravity, Urine: 1.026 (ref 1.005–1.030)
pH: 6 (ref 5.0–8.0)

## 2024-02-26 LAB — LIPASE, BLOOD: Lipase: 64 U/L — ABNORMAL HIGH (ref 11–51)

## 2024-02-26 MED ORDER — MINERAL OIL RE ENEM: 1.0000 | ENEMA | Freq: Once | RECTAL | 0 refills | Status: AC

## 2024-02-26 MED ORDER — SUCRALFATE 1 G PO TABS: 1.0000 g | ORAL_TABLET | Freq: Three times a day (TID) | ORAL | 0 refills | Status: DC

## 2024-02-26 MED ORDER — PROMETHAZINE HCL 25 MG RE SUPP: 25.0000 mg | Freq: Four times a day (QID) | RECTAL | 0 refills | Status: DC | PRN

## 2024-02-26 MED ORDER — PROMETHAZINE HCL 25 MG PO TABS: 25.0000 mg | ORAL_TABLET | Freq: Four times a day (QID) | ORAL | 0 refills | Status: DC | PRN

## 2024-02-26 MED ORDER — IOHEXOL 300 MG/ML  SOLN
100.0000 mL | Freq: Once | INTRAMUSCULAR | Status: AC | PRN
  Administered 2024-02-26: 75 mL via INTRAVENOUS

## 2024-02-26 NOTE — Discharge Instructions (Addendum)
 Please read and follow all provided instructions.  Your diagnoses today include:  1. Generalized abdominal pain   2. Constipation, unspecified constipation type     Tests performed today include: Complete blood cell count: Normal white blood cell count and red blood cell count Complete metabolic panel: Normal liver and kidney function Lipase (pancreas function test): Slightly elevated pancreas test, this has been elevated at times in the past, overall not concerning for pancreatitis X-ray of the abdomen: Shows nonspecific findings, not suggestive of severe constipation or bowel obstruction Vital signs. See below for your results today.   Home care instructions:  Follow any educational materials contained in this packet.  Follow-up instructions: Please follow-up with your gastroenterology office as planned next week.  Return instructions:  SEEK IMMEDIATE MEDICAL ATTENTION IF: The pain does not go away or becomes severe  A temperature above 101F develops  Repeated vomiting occurs (multiple episodes)  The pain becomes localized to portions of the abdomen. The right side could possibly be appendicitis. In an adult, the left lower portion of the abdomen could be colitis or diverticulitis.  Blood is being passed in stools or vomit (bright red or black tarry stools)  You develop chest pain, difficulty breathing, dizziness or fainting, or become confused, poorly responsive, or inconsolable (young children) If you have any other emergent concerns regarding your health  Additional Information: Abdominal (belly) pain can be caused by many things. Your caregiver performed an examination and possibly ordered blood/urine tests and imaging (CT scan, x-rays, ultrasound). Many cases can be observed and treated at home after initial evaluation in the emergency department. Even though you are being discharged home, abdominal pain can be unpredictable. Therefore, you need a repeated exam if your pain does  not resolve, returns, or worsens. Most patients with abdominal pain don't have to be admitted to the hospital or have surgery, but serious problems like appendicitis and gallbladder attacks can start out as nonspecific pain. Many abdominal conditions cannot be diagnosed in one visit, so follow-up evaluations are very important.  Your vital signs today were: BP 97/80   Pulse 68   Temp 98.1 F (36.7 C) (Oral)   Resp 19   SpO2 100%  If your blood pressure (bp) was elevated above 135/85 this visit, please have this repeated by your doctor within one month. --------------

## 2024-02-26 NOTE — ED Provider Notes (Signed)
 [place] EMERGENCY DEPARTMENT AT [facility] Provider Note   CSN: [id number] Arrival date & time: 02/26/24  1355     Patient presents with: Constipation   Nichole Manning is a 58 y.o. female.   Patient with history of cholecystectomy on 01/14/2024 presents to the emergency department today for evaluation of abdominal pain and constipation.  Patient states that she was encouraged to come to the emergency department by GI today.  Patient reports having some small-volume intermittent liquid stools, but generalized constipation despite doubling the dose of Linzess , trialing mag citrate at home and enema twice at home.  She is concerned about ongoing constipation and reports that one of her GIs concern was for bowel obstruction.  Patient also reports a new and unusual bitter taste or sour taste in her mouth and a feeling that something is stuck in her throat.  She denies regurgitation or vomiting.  She reports a generalized bloating sensation and soreness in her abdomen.  No fever.  No shortness of breath or difficulty breathing.  No dysuria, increased frequency or urgency.  Patient is on Ozempic as well.  Of note, patient had some similar symptoms on postoperative follow-up appointment on 8/28.       Prior to Admission medications   Medication Sig Start Date End Date Taking? Authorizing Provider  Clindamycin-Benzoyl Per, Refr, gel Apply 1 application topically daily. 08/27/20   [provider]  cyanocobalamin (,VITAMIN B-12,) 1000 MCG/ML injection Inject 1,000 mcg into the muscle every 28 (twenty-eight) days. 08/08/20   [provider]  Erenumab -aooe (AIMOVIG ) 140 MG/ML SOAJ Inject 140 mg into the skin every 28 (twenty-eight) days. 11/09/23   Skeet Juliene SAUNDERS, DO  estradiol (ESTRACE) 0.5 MG tablet Take 0.5 mg by mouth daily. 10/23/22   [provider]  hydrOXYzine (VISTARIL) 25 MG capsule Take 25-50 mg by mouth at bedtime as needed for anxiety. 10/23/22   [provider]  levothyroxine  (SYNTHROID ) 88 MCG tablet Take 88 mcg by mouth daily before breakfast. 11/05/23   [provider]  lidocaine  (LIDODERM ) 5 % Place 1 patch onto the skin daily. Remove & Discard patch within 12 hours or as directed by MD 01/30/24   Theadore Ozell HERO, MD  linaclotide  (LINZESS ) 145 MCG CAPS capsule Take 1 capsule (145 mcg total) by mouth daily. 01/14/24   [name], MD  methocarbamol  (ROBAXIN ) 500 MG tablet Take 1 tablet (500 mg total) by mouth every 8 (eight) hours as needed for muscle spasms. 01/30/24   Theadore Ozell HERO, MD  metoprolol  tartrate (LOPRESSOR ) 100 MG tablet Take 1 tablet (100 mg total) by mouth once for 1 dose. 02/12/24 02/12/24  Lucien Orren SAILOR, PA-C  minoxidil (LONITEN) 2.5 MG tablet Take 2.5 mg by mouth daily. 08/28/22   [provider]  Multiple Vitamin (MULTIVITAMIN WITH MINERALS) TABS tablet Take 1 tablet by mouth daily.    [provider]  naproxen  (NAPROSYN ) 500 MG tablet Take 1 tablet (500 mg total) by mouth 2 (two) times daily. 01/30/24   Theadore Ozell HERO, MD  omeprazole  (PRILOSEC) 40 MG capsule Take 1 capsule (40 mg total) by mouth 2 (two) times daily. 11/27/23   Heinz, Sara E, PA-C  ondansetron  (ZOFRAN ) 4 MG tablet Take 1 tablet (4 mg total) by mouth every 8 (eight) hours as needed for nausea or vomiting. 01/14/24   [name], MD  ondansetron  (ZOFRAN -ODT) 4 MG disintegrating tablet Take 1 tablet (4 mg total) by mouth every 8 (eight) hours as needed for  nausea or vomiting. 12/31/23   Beverley Leita LABOR, PA-C  oxyCODONE  (ROXICODONE ) 5 MG immediate release tablet Take 1 tablet (5 mg total) by mouth every 6 (six) hours as needed for severe pain (pain score 7-10). 01/14/24   [name], MD  OZEMPIC, 0.25 OR 0.5 MG/DOSE, 2 MG/3ML SOPN Inject 0.25 mg as directed once a week. 05/12/23   [provider]  progesterone (PROMETRIUM) 200 MG capsule Take 200 mg by mouth at bedtime as needed (discomfort). 10/23/22   [provider]   promethazine  (PHENERGAN ) 12.5 MG tablet Take 12.5 mg by mouth every 4 (four) hours as needed for nausea. 09/05/23   [provider]  propranolol  ER (INDERAL  LA) 60 MG 24 hr capsule TAKE 1 CAPSULE BY MOUTH EVERY DAY 11/09/23   Lonni Slain, MD  rosuvastatin  (CRESTOR ) 10 MG tablet Take 1 tablet (10 mg total) by mouth daily. 02/12/24 05/12/24  Lucien Orren SAILOR, PA-C  SPIRIVA RESPIMAT 2.5 MCG/ACT AERS Inhale 2 puffs into the lungs daily as needed (shortness of breath). 05/14/23   [provider]  spironolactone  (ALDACTONE ) 25 MG tablet Take 1 tablet (25 mg total) by mouth daily. 02/12/24 05/12/24  Lucien Orren SAILOR, PA-C  tretinoin (RETIN-A) 0.05 % cream Apply 1 Application topically at bedtime.    [provider]  venlafaxine XR (EFFEXOR-XR) 37.5 MG 24 hr capsule Take 37.5 mg by mouth at bedtime. 09/29/23   [provider]  vitamin C (ASCORBIC ACID) 500 MG tablet Take 500 mg by mouth daily.    [provider]    Allergies: Sulfa antibiotics, Amoxicillin, and Codeine    Review of Systems  Updated Vital Signs BP 97/80   Pulse 68   Temp 98.1 F (36.7 C) (Oral)   Resp 19   SpO2 100%   Physical Exam Vitals and nursing note reviewed.  Constitutional:      General: She is not in acute distress.    Appearance: She is well-developed.  HENT:     Head: Normocephalic and atraumatic.     Right Ear: External ear normal.     Left Ear: External ear normal.     Nose: Nose normal.  Eyes:     Conjunctiva/sclera: Conjunctivae normal.  Cardiovascular:     Rate and Rhythm: Normal rate and regular rhythm.     Heart sounds: No murmur heard. Pulmonary:     Effort: No respiratory distress.     Breath sounds: No wheezing, rhonchi or rales.  Abdominal:     Palpations: Abdomen is soft.     Tenderness: There is abdominal tenderness (Mild, generalized). There is no guarding or rebound.  Musculoskeletal:     Cervical back: Normal range of motion and neck supple.      Right lower leg: No edema.     Left lower leg: No edema.  Skin:    General: Skin is warm and dry.     Findings: No rash.  Neurological:     General: No focal deficit present.     Mental Status: She is alert. Mental status is at baseline.     Motor: No weakness.  Psychiatric:        Mood and Affect: Mood normal.     (all labs ordered are listed, but only abnormal results are displayed) Labs Reviewed  URINALYSIS, ROUTINE W REFLEX MICROSCOPIC - Abnormal; Notable for the following components:      Result Value   Ketones, ur 15 (*)    All other components within normal  limits  LIPASE, BLOOD - Abnormal; Notable for the following components:   Lipase 64 (*)    All other components within normal limits  CBC  COMPREHENSIVE METABOLIC PANEL WITH GFR    EKG: None  Radiology: DG Abdomen 1 View Result Date: 02/26/2024 CLINICAL DATA:  Constipation, abdominal pain EXAM: ABDOMEN - 1 VIEW COMPARISON:  CT 01/29/2024 FINDINGS: The bowel gas pattern is normal. No radio-opaque calculi or other significant radiographic abnormality are seen. No excessive stool burden. IMPRESSION: In Electronically Signed   By: Franky Crease M.D.   On: 02/26/2024 18:14     Procedures   Medications Ordered in the ED  iohexol  (OMNIPAQUE ) 300 MG/ML solution 100 mL (has no administration in time range)   ED Course  Patient seen and examined. History obtained directly from patient.  I reviewed recent surgical notes, follow-up notes, GI telephone conversation.  Labs/EKG: Ordered CBC, CMP, UA.  Imaging: Ordered KUB.  Medications/Fluids: None ordered  Most recent vital signs reviewed and are as follows: BP 97/80   Pulse 68   Temp 98.1 F (36.7 C) (Oral)   Resp 19   SpO2 100%   Initial impression: Abdominal pain, constipation, not improving after GI surgery.  7:12 PM Reassessment performed. Patient appears comfortable, anxious.  Labs personally reviewed and interpreted including: CBC with normal white  blood cell count and hemoglobin; CMP is unremarkable; lipase 64 mildly elevated, similar to some previous values; UA negative.  Imaging personally visualized and interpreted including: KUB nonspecific bowel gas pattern  Reviewed pertinent lab work and imaging with patient at bedside. Questions answered.   Most current vital signs reviewed and are as follows: BP 97/80   Pulse 68   Temp 98.1 F (36.7 C) (Oral)   Resp 19   SpO2 100%   Plan: Discussed continued symptomatic treatment versus additional evaluation here with imaging.  Patient would prefer to have imaging done as symptoms seem to be worsening.  CT abdomen pelvis ordered.  7:36 PM Signout to [employer] at shift change.                                    Medical Decision Making Amount and/or Complexity of Data Reviewed Labs: ordered. Radiology: ordered.   For this patient's complaint of abdominal pain, the following conditions were considered on the differential diagnosis: gastritis/PUD, enteritis/duodenitis, appendicitis, cholelithiasis/cholecystitis, cholangitis, pancreatitis, ruptured viscus, colitis, diverticulitis, small/large bowel obstruction, proctitis, cystitis, pyelonephritis, ureteral colic, aortic dissection, aortic aneurysm. Atypical chest etiologies were also considered including ACS, PE, and pneumonia.  Labs reassuring. X-ray unremarkable. Pending CT.       Final diagnoses:  Generalized abdominal pain  Constipation, unspecified constipation type    ED Discharge Orders     None          Desiderio Chew, PA-C 02/26/24 1936    Lenor Hollering, MD 02/26/24 [phone]

## 2024-02-26 NOTE — ED Notes (Signed)
 Patient unable to provide urine sample at this time. Specimen cup given to patient and reviewed instructions for clean catch collection.

## 2024-02-26 NOTE — Telephone Encounter (Signed)
 Inbound call from patient stating she is having some issues after her gallbladder removal and would like to speak to nurse. Patient is scheduled for an office visit with PA 03/03/24 Please advise  Thank you

## 2024-02-26 NOTE — ED Provider Notes (Signed)
  Physical Exam  BP 97/80   Pulse 68   Temp 98.1 F (36.7 C) (Oral)   Resp 19   SpO2 100%   Physical Exam  Procedures  Procedures  ED Course / MDM    Medical Decision Making Amount and/or Complexity of Data Reviewed Labs: ordered. Radiology: ordered.  Risk Prescription drug management.   Patient care handed from [facility] at shift change.  See prior note for more full details.  In short, 58 year old female presents emergency department with complaints of abdominal pain, constipation.  Was encouraged to come by GI today.  Has tried multiple medications to help with constipation concern for possible bowel obstruction.  Awaiting CT imaging of abdomen at time of shift change.  Labs unremarkable for acute emergent process.  Slight lipase elevation but patient without epigastric tenderness with lipase of only 64.  CT imaging resulted showed no acute abnormality as cause of symptoms.  Will recommend follow-up with GI as she already has appointment in a few days.  Treatment plan discussed with patient she is understanding was agreeable.  Patient well-appearing, afebrile in no acute distress upon discharge.  Worrisome signs and symptoms were discussed with the patient and the patient knowledge to return to emergency department if notice.  Patient stable upon discharge.       Silver Wonda LABOR, [place] 02/26/24 1947    Lenor Hollering, MD 02/26/24 [phone]

## 2024-02-26 NOTE — Telephone Encounter (Signed)
 Patient is advised. She states she really feels bad.  Also has had recent tummy tuck. She is reluctant to go to the ER, afraid they will think she is a hypochondriac. Agrees to strongly consider.

## 2024-02-26 NOTE — Telephone Encounter (Signed)
 Patient had cholecystectomy 01/14/24. She has been experiencing worsening constipation since then. She is now passing only minute amounts of liquid per rectum. She increased her Linzess  to 290 mcg without improvement. Most recently she took magnesium citrate on 02/22/24 and an enema 02/23/24. She did not have any results with either intervention. Reports back pain, stomach is bloated and sore. She is beginning to reflux bile in my mouth.Patient has a history of constipation which has acutely worsened since surgery.

## 2024-02-26 NOTE — ED Triage Notes (Signed)
 Constipation Yesterday liquid stool Unsure when last BM was Bad taste in mouth Seen by GI suspect obstruction On ozempic and lynesta

## 2024-02-29 NOTE — Telephone Encounter (Signed)
 Inbound call from patient stating that she was advised to go to the ER due to symptoms as stating below. Patient feeling like she is going to explode. Patient is requesting a call back from the nurse. Please advise.

## 2024-03-01 NOTE — Telephone Encounter (Signed)
 Appointment 03/03/24

## 2024-03-03 ENCOUNTER — Other Ambulatory Visit: Payer: Self-pay | Admitting: Gastroenterology

## 2024-03-03 ENCOUNTER — Encounter: Payer: Self-pay | Admitting: Gastroenterology

## 2024-03-03 ENCOUNTER — Ambulatory Visit: Admitting: Gastroenterology

## 2024-03-03 ENCOUNTER — Encounter (HOSPITAL_BASED_OUTPATIENT_CLINIC_OR_DEPARTMENT_OTHER): Payer: Self-pay

## 2024-03-03 VITALS — BP 118/64 | HR 79 | Ht 65.0 in | Wt 134.0 lb

## 2024-03-03 DIAGNOSIS — K5900 Constipation, unspecified: Secondary | ICD-10-CM

## 2024-03-03 DIAGNOSIS — Z9049 Acquired absence of other specified parts of digestive tract: Secondary | ICD-10-CM

## 2024-03-03 DIAGNOSIS — R140 Abdominal distension (gaseous): Secondary | ICD-10-CM

## 2024-03-03 DIAGNOSIS — Z860102 Personal history of hyperplastic colon polyps: Secondary | ICD-10-CM | POA: Diagnosis not present

## 2024-03-03 DIAGNOSIS — K589 Irritable bowel syndrome without diarrhea: Secondary | ICD-10-CM | POA: Insufficient documentation

## 2024-03-03 MED ORDER — IBSRELA 50 MG PO TABS: 50.0000 mg | ORAL_TABLET | Freq: Two times a day (BID) | ORAL | Status: DC

## 2024-03-03 MED ORDER — RIFAXIMIN 550 MG PO TABS: 550.0000 mg | ORAL_TABLET | Freq: Three times a day (TID) | ORAL | 0 refills | Status: DC

## 2024-03-03 NOTE — Patient Instructions (Signed)
 We have sent the following medications to your pharmacy for you to pick up at your convenience: Xifaxan  550 mg three times daily for 14 days.   We have given you samples of the following medication to take:  Ibsrela  50 mg twice daily.    Call or send Mychart message in the next few days with an update.

## 2024-03-03 NOTE — Telephone Encounter (Signed)
 Please submit for PA

## 2024-03-03 NOTE — Progress Notes (Addendum)
 03/03/2024 Nichole Manning [id number] November 21, 1965   HISTORY OF PRESENT ILLNESS: This is a 58 year old female who is a patient of Dr. Darilyn.  She underwent EGD and colonoscopy as below back in May.  Colonoscopy was apparently done for screening and EGD for some reflux type symptoms.  Then after those procedure she was seen in our office for complaints of postprandial abdominal pain, nausea and vomiting, constipation, etc..  Ended up having ultrasound and HIDA scan and had gallbladder removed in August.  She is now back here today telling me that she has had significant constipation, stopped up since her cholecystectomy.  She has chronic constipation and usually takes Linzess  145 mcg daily which usually has her in the bathroom nonstop.  She says that she is even doubled that and has not helped lately.  She used everything over-the-counter including suppositories, enemas, magnesium citrate.  Magnesium citrate did produce yellow liquid stool.  She says that she feels so bloated and distended that she feels like she was going to explode.  She does have some nausea daily and has some metallic taste in her mouth, but was not overly concerned about that.  Her main complaint is what she thinks is constipation and the bloating and distention.  Had multiple imaging studies including 5 CT scans since June, 2 of those since her gallbladder surgery in August.  She also had an abdominal x-ray.  None of those studies have shown any acute issues and no significant stool burden was noted.  Colonoscopy May 2025 showed only diverticulosis and one hyperplastic polyp that was removed.  EGD May 2025 with gastritis.  Biopsy showed mild reactive gastropathy, negative for H. pylori.  Past Medical History:  Diagnosis Date   Anxiety    Constipation    Fibroids    Headache    migraines   Hyperlipidemia    Hypothyroidism    ILD (interstitial lung disease) (HCC)    Palpitations    PONV (postoperative nausea and  vomiting)    mild nausea per pt   PVC's (premature ventricular contractions)    SVD (spontaneous vaginal delivery)    x 3   Past Surgical History:  Procedure Laterality Date   ancillary surgery Bilateral    09/09/23 fat removal   APPENDECTOMY     laparoscopic   AUGMENTATION MAMMAPLASTY     BILATERAL SALPINGECTOMY Bilateral 07/01/2013   Procedure: BILATERAL SALPINGECTOMY;  Surgeon: [name], MD;  Location: [facility];  Service: Gynecology;  Laterality: Bilateral;   BLEPHAROPLASTY Bilateral    BREAST SURGERY     breast reduction x 2   CHOLECYSTECTOMY N/A 01/14/2024   Procedure: LAPAROSCOPIC CHOLECYSTECTOMY;  Surgeon: [name], MD;  Location: [facility];  Service: General;  Laterality: N/A;  LAPAROSCOPIC CHOLECYSTECTOMY WITH ICG   LAPAROSCOPIC APPENDECTOMY N/A 09/09/2020   Procedure: APPENDECTOMY LAPAROSCOPIC;  Surgeon: [name], MD;  Location: [facility];  Service: General;  Laterality: N/A;   TUBAL LIGATION     Tummy tuck     09/09/23   WISDOM TOOTH EXTRACTION      reports that she has quit smoking. Her smoking use included cigarettes. She has a 45 pack-year smoking history. She has never used smokeless tobacco. She reports that she does not currently use alcohol. She reports that she does not use drugs. family history includes Breast cancer (age of onset: 19) in her mother; Cancer in her father. She was adopted. Allergies  Allergen Reactions   Sulfa Antibiotics Hives  Amoxicillin Nausea Only   Codeine Nausea And Vomiting      Outpatient Encounter Medications as of 03/03/2024  Medication Sig   Clindamycin-Benzoyl Per, Refr, gel Apply 1 application topically daily.   cyanocobalamin (,VITAMIN B-12,) 1000 MCG/ML injection Inject 1,000 mcg into the muscle every 28 (twenty-eight) days.   Erenumab -aooe (AIMOVIG ) 140 MG/ML SOAJ Inject 140 mg into the skin every 28 (twenty-eight) days.   estradiol (ESTRACE) 0.5 MG tablet Take 0.5 mg by mouth daily.   hydrOXYzine (VISTARIL) 25  MG capsule Take 25-50 mg by mouth at bedtime as needed for anxiety.   levothyroxine  (SYNTHROID ) 88 MCG tablet Take 88 mcg by mouth daily before breakfast.   linaclotide  (LINZESS ) 145 MCG CAPS capsule Take 1 capsule (145 mcg total) by mouth daily.   minoxidil (LONITEN) 2.5 MG tablet Take 2.5 mg by mouth daily.   Multiple Vitamin (MULTIVITAMIN WITH MINERALS) TABS tablet Take 1 tablet by mouth daily.   omeprazole  (PRILOSEC) 40 MG capsule Take 1 capsule (40 mg total) by mouth 2 (two) times daily.   ondansetron  (ZOFRAN ) 4 MG tablet Take 1 tablet (4 mg total) by mouth every 8 (eight) hours as needed for nausea or vomiting.   ondansetron  (ZOFRAN -ODT) 4 MG disintegrating tablet Take 1 tablet (4 mg total) by mouth every 8 (eight) hours as needed for nausea or vomiting.   OZEMPIC, 0.25 OR 0.5 MG/DOSE, 2 MG/3ML SOPN Inject 0.25 mg as directed once a week.   progesterone (PROMETRIUM) 200 MG capsule Take 200 mg by mouth at bedtime as needed (discomfort).   promethazine  (PHENERGAN ) 25 MG suppository Place 1 suppository (25 mg total) rectally every 6 (six) hours as needed for nausea or vomiting.   promethazine  (PHENERGAN ) 25 MG tablet Take 1 tablet (25 mg total) by mouth every 6 (six) hours as needed for nausea or vomiting.   propranolol  ER (INDERAL  LA) 60 MG 24 hr capsule TAKE 1 CAPSULE BY MOUTH EVERY DAY   rosuvastatin  (CRESTOR ) 10 MG tablet Take 1 tablet (10 mg total) by mouth daily.   SPIRIVA RESPIMAT 2.5 MCG/ACT AERS Inhale 2 puffs into the lungs daily as needed (shortness of breath).   spironolactone  (ALDACTONE ) 25 MG tablet Take 1 tablet (25 mg total) by mouth daily.   sucralfate  (CARAFATE ) 1 g tablet Take 1 tablet (1 g total) by mouth 4 (four) times daily -  with meals and at bedtime.   tretinoin (RETIN-A) 0.05 % cream Apply 1 Application topically at bedtime.   venlafaxine XR (EFFEXOR-XR) 37.5 MG 24 hr capsule Take 37.5 mg by mouth at bedtime.   vitamin C (ASCORBIC ACID) 500 MG tablet Take 500 mg by  mouth daily.   [DISCONTINUED] lidocaine  (LIDODERM ) 5 % Place 1 patch onto the skin daily. Remove & Discard patch within 12 hours or as directed by MD   [DISCONTINUED] methocarbamol  (ROBAXIN ) 500 MG tablet Take 1 tablet (500 mg total) by mouth every 8 (eight) hours as needed for muscle spasms.   [DISCONTINUED] metoprolol  tartrate (LOPRESSOR ) 100 MG tablet Take 1 tablet (100 mg total) by mouth once for 1 dose.   [DISCONTINUED] naproxen  (NAPROSYN ) 500 MG tablet Take 1 tablet (500 mg total) by mouth 2 (two) times daily.   [DISCONTINUED] oxyCODONE  (ROXICODONE ) 5 MG immediate release tablet Take 1 tablet (5 mg total) by mouth every 6 (six) hours as needed for severe pain (pain score 7-10).   No facility-administered encounter medications on file as of 03/03/2024.    REVIEW OF SYSTEMS  : All other systems  reviewed and negative except where noted in the History of Present Illness.   PHYSICAL EXAM: BP 118/64   Pulse 79   Ht 5' 5 (1.651 m)   Wt 134 lb (60.8 kg)   BMI 22.30 kg/m  General: Well developed white female in no acute distress Head: Normocephalic and atraumatic Eyes:  Sclerae anicteric, conjunctiva pink. Ears: Normal auditory acuity Abdomen: Soft, mildly distended/bloated.  BS present.  Minimal diffuse TTP. Musculoskeletal: Symmetrical with no gross deformities  Skin: No lesions on visible extremities Extremities: No edema  Neurological: Alert oriented x 4, grossly non-focal Psychological:  Alert and cooperative. Normal mood and affect  ASSESSMENT AND PLAN: *58 year old female with chronic constipation who is status post cholecystectomy in August and now complaining of severe constipation with bloating and distention with associated pain, nausea, and bitter taste in her mouth since her cholecystectomy.  She remains focused on the constipation, saying that her Linzess  even at double dose is not working for her constipation.  She has had 2 CT scans and an abdominal x-ray since her  cholecystectomy that have shown no cause of her symptoms and no significant stool burden.  I am willing to give her samples of Ibsrela  50 mg twice daily to take with food to see if that will help move her bowels better.  Otherwise I am going to try to empirically treat her with a course of Xifaxan  550 mg 3 times daily for 14 days for a SIBO or IBS type picture.  Prescription sent to pharmacy.  Patient to update us  on her response to Ibsrela .  She is back on Ozempic as she says her cardiologist told her to restart it.   CC:  Donal Channing SQUIBB, FNP     [facility] Attending   I agree with the Advanced Practitioner's note, impression and recommendations with the following additions:  Unusual but not impossible to be more constipated after cholecystec tomy. Have top wonder about possible side effects of Ozempic and I did not see anywhee that cardiology recommended she take it - she has a NL BMI.  I would stop the Ozempic but not sure she will do it.  Lupita CHARLENA Commander, MD, [place]OL[place]

## 2024-03-04 ENCOUNTER — Ambulatory Visit (INDEPENDENT_AMBULATORY_CARE_PROVIDER_SITE_OTHER)

## 2024-03-04 ENCOUNTER — Other Ambulatory Visit (HOSPITAL_COMMUNITY): Payer: Self-pay

## 2024-03-04 ENCOUNTER — Telehealth: Payer: Self-pay

## 2024-03-04 DIAGNOSIS — R600 Localized edema: Secondary | ICD-10-CM

## 2024-03-04 LAB — ECHOCARDIOGRAM COMPLETE
AR max vel: 2.65 cm2
AV Area VTI: 2.54 cm2
AV Area mean vel: 2.31 cm2
AV Mean grad: 2 mmHg
AV Peak grad: 3.9 mmHg
AV Vena cont: 0.21 cm
Ao pk vel: 0.98 m/s
Area-P 1/2: 3.12 cm2
P 1/2 time: 486 ms
S' Lateral: 2.62 cm

## 2024-03-04 NOTE — Telephone Encounter (Signed)
 PA request has been Started. New Encounter has been or will be created for follow up. For additional info see Pharmacy Prior Auth telephone encounter from 03-04-2024.

## 2024-03-04 NOTE — Telephone Encounter (Signed)
 Pharmacy Patient Advocate Encounter   Received notification from RX Request Messages that prior authorization for Xifaxan  550MG  tablets is required/requested.   Insurance verification completed.   The patient is insured through [facility] .   Per test claim: xxx

## 2024-03-07 ENCOUNTER — Encounter: Payer: Self-pay | Admitting: *Deleted

## 2024-03-07 ENCOUNTER — Telehealth: Payer: Self-pay | Admitting: Gastroenterology

## 2024-03-07 NOTE — Telephone Encounter (Signed)
 The pt has been advised that the PA team is aware and we are working to try and get coverage we will reach out as needed.

## 2024-03-07 NOTE — Telephone Encounter (Signed)
 Spoke with Nichole Manning regarding scheduling appt to discuss Echo results.Nichole Manning stated she schedule an appt for 10/30 with Orren Fabry.  Nichole Manning was advise per Orren Fabry she needs an appt this week. Nichole Manning agreed to come in to see Lum Louis on 9/30 @ 11:20 am. Nichole Manning had no further questions or concerns.

## 2024-03-07 NOTE — Telephone Encounter (Signed)
 Inbound call from patient requesting a call back as soon as possible from nurse to discuss Xifaxan . Please advise.

## 2024-03-08 ENCOUNTER — Encounter: Payer: Self-pay | Admitting: Emergency Medicine

## 2024-03-08 ENCOUNTER — Ambulatory Visit: Attending: Emergency Medicine | Admitting: Emergency Medicine

## 2024-03-08 ENCOUNTER — Telehealth: Payer: Self-pay

## 2024-03-08 VITALS — BP 94/68 | HR 78 | Ht 65.0 in | Wt 131.2 lb

## 2024-03-08 DIAGNOSIS — I471 Supraventricular tachycardia, unspecified: Secondary | ICD-10-CM | POA: Diagnosis present

## 2024-03-08 DIAGNOSIS — E785 Hyperlipidemia, unspecified: Secondary | ICD-10-CM | POA: Diagnosis present

## 2024-03-08 DIAGNOSIS — E039 Hypothyroidism, unspecified: Secondary | ICD-10-CM | POA: Diagnosis present

## 2024-03-08 DIAGNOSIS — I700 Atherosclerosis of aorta: Secondary | ICD-10-CM | POA: Insufficient documentation

## 2024-03-08 DIAGNOSIS — I493 Ventricular premature depolarization: Secondary | ICD-10-CM | POA: Diagnosis present

## 2024-03-08 DIAGNOSIS — I77810 Thoracic aortic ectasia: Secondary | ICD-10-CM | POA: Insufficient documentation

## 2024-03-08 DIAGNOSIS — R600 Localized edema: Secondary | ICD-10-CM | POA: Insufficient documentation

## 2024-03-08 NOTE — Telephone Encounter (Signed)
 Left patient a message in regards to today's appointment since she has a follow to her Echo today 03/08/24 with Lum Louis, NP and the same with Orren Fabry, PA-C on 04/07/24. I called the patient to see if she would like to keep this appointment or wait. I will explain this to her when she returns my call.

## 2024-03-08 NOTE — Telephone Encounter (Signed)
 Based on the documentation, the clinical notes focus on symptoms of constipation and, as written, would not support a prior authorization request for Xifaxan  under its FDA-approved indications. Could you clarify the intended indication for therapy?

## 2024-03-08 NOTE — Progress Notes (Signed)
 Cardiology Office Note:    Date:  03/08/2024  ID:  Nichole Manning, DOB 05/14/1966, MRN [id number] PCP: Donal Channing SQUIBB, FNP  [facility] Providers Cardiologist:  Shelda Bruckner, MD       Patient Profile:       Chief Complaint: 1 month follow-up History of Present Illness:  Nichole Manning is a 58 y.o. female with visit-pertinent history of anxiety, COPD, hypothyroidism, palpitations, PSVT, PAC, PVC, mild MR  Cardiac monitor 01/2020 showed heart rate 55 through 144 bpm, average 79 bpm, NSR, 5 PSVT, rare PACs PVCs.  She was seen in clinic on 06/2022 for preop evaluation was found to have abnormal EKG and exertional dyspnea.  EKG obtained from PCP visit showed NSR with frequent PVCs, nonspecific STTW changes otherwise in precordial leads though possibly due to lead placement.  3-day ZIO 07/2022 showed 2.2% PVCs, less than 1% PACs, 3 short runs of SVT.  Echocardiogram 07/2022 showed LVEF 60 to 65%, trivial MR.  Coronary CTA was unable to be performed due to her PVCs, so ETT was performed instead which was read as negative/low risk achieving 9.2 METS.  Sleep study due to fatigue was negative.  She was last seen in clinic by [place], [place] on 02/12/2024.  She had noted persistent fatigue and lethargy since her gallbladder removal in January 14, 2024 with poor sleep.  She is gaining weight and experienced shortness of breath.  She was restarted on spironolactone  25 mg daily.  She was referred to endocrinologist due to persistent fatigue and lethargy.  Coronary CTA 02/18/2024 showed coronary calcium  score of 0 and no evidence of CAD.  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   Discussed the use of AI scribe software for clinical note transcription with the patient, who gave verbal consent to proceed.  History of Present Illness Nichole Manning is a 58 year old female who presents with concerns about aortic aneurysm and aortic atherosclerosis following recent imaging studies.   Today patient is doing well overall.  She is without any acute cardiovascular concerns today.  Reports her PVCs are well-controlled.  She denies any chest pains, dyspnea, orthopnea, PND, LEE.  No lightheadedness, dizziness, syncope or presyncope.  Her recent echocardiogram showed mild ascending thoracic aneurysm.  She is concerned about the findings and their implications for her health.  She has a 40-year history of smoking but has since quit. She is surprised at not having significant coronary artery disease despite her smoking history.   Review of systems:  Please see the history of present illness. All other systems are reviewed and otherwise negative.      Studies Reviewed:        Echocardiogram 03/04/2024  1. Left ventricular ejection fraction, by estimation, is 60 to 65%. Left  ventricular ejection fraction by 3D volume is 69 %. The left ventricle has  normal function. The left ventricle has no regional wall motion  abnormalities. Left ventricular diastolic   parameters were normal.   2. Right ventricular systolic function is normal. The right ventricular  size is normal.   3. The mitral valve is normal in structure. Trivial mitral valve  regurgitation.   4. The aortic valve was not well visualized. Aortic valve regurgitation  is mild. No aortic stenosis is present.   5. Aortic dilatation noted. There is dilatation of the ascending aorta,  measuring 40 mm.   6. The inferior vena cava is normal in size with greater than 50%  respiratory variability, suggesting right atrial pressure of 3 mmHg.   Coronary CTA 02/18/2024 1. Coronary calcium  score of 0. This was 1st percentile for age, sex, and race matched control.   2. Normal coronary origin with right dominance.   3. CAD-RADS 0. No evidence of CAD (0%). Consider  non-atherosclerotic causes of chest pain.  Exercise tolerance test 08/05/2022   Good exercise capacity, achieved 9.2 METS   Peak heart rate 166 bpm (101% max age-predicted heart rate)   Normal blood pressure response to exercise   up sloping ST depression was noted.  No evidence of ischemia.   Low risk study  Echocardiogram 07/18/2022  1. Left ventricular ejection fraction, by estimation, is 60 to 65%. The  left ventricle has normal function. The left ventricle has no regional  wall motion abnormalities. Left ventricular diastolic parameters were  normal.   2. Right ventricular systolic function is normal. The right ventricular  size is normal.   3. The mitral valve is normal in structure. Trivial mitral valve  regurgitation. No evidence of mitral stenosis.   4. The aortic valve is tricuspid. Aortic valve regurgitation is not  visualized. No aortic stenosis is present.   5. The inferior vena cava is normal in size with greater than 50%  respiratory variability, suggesting right atrial pressure of 3 mmHg.   Zio 07/11/2022 Patch Wear Time:  3 days and 8 hours (2024-01-26T15:22:25-0500 to 2024-01-29T23:56:35-0500)   Patient had a min HR of 65 bpm, max HR of 169 bpm, and avg HR of 89 bpm. Predominant underlying rhythm was Sinus Rhythm. 3 Supraventricular Tachycardia runs occurred, the run with the fastest interval lasting 11 beats with a max rate of 169 bpm, the longest lasting 13 beats with an avg rate of 149 bpm. No VT, atrial fibrillation, high degree block, or pauses noted. Isolated atrial ventricular ectopy was rare (<1%), isolated ventricular ectopy was occasional (2.2%). There were 0 triggered events. No high risk arrhythmias detected.   Risk Assessment/Calculations:             Physical Exam:   VS:  BP 94/68   Pulse 78   Ht 5' 5 (1.651 m)   Wt 131 lb 3.2 oz (59.5 kg)   SpO2 98%   BMI 21.83 kg/m    Wt Readings from Last 3 Encounters:  03/08/24 131 lb 3.2 oz (59.5 kg)   03/03/24 134 lb (60.8 kg)  02/12/24 136 lb 3.2 oz (61.8 kg)    GEN: Well nourished, well developed in no acute distress NECK: No JVD; No carotid bruits CARDIAC: RRR, no murmurs, rubs, gallops RESPIRATORY:  Clear to auscultation without rales, wheezing or rhonchi  ABDOMEN: Soft, non-tender, non-distended EXTREMITIES:  No edema; No acute deformity      Assessment and Plan:  Aortic atherosclerosis Coronary CTA 02/2024 with coronary calcium  score of 0 and aorta without significant calcifications - Today she remains without anginal symptoms - Her LDL is well-controlled - Encouraged heart healthy dieting and physical exercise - Continue rosuvastatin  10 mg daily  Hyperlipidemia, LDL goal <70 LDL 40 on 02/2024 and well-controlled - Continue rosuvastatin  10 mg daily  PVCs PSVT Palpitations ZIO 07/2022 with 2.2% PVC burden Quiescent and well-controlled on current beta-blocker therapy - Continue propranolol  ER 60 mg daily  Lower extremity edema Echocardiogram 02/2024 with LVEF 60 to 65%, no RWMA, normal diastolic parameters LEE has improved with restarting  MRA - Recommended lower extremity stockings, leg elevation, and low-sodium dieting - Continue with spironolactone  25 mg daily  Hypothyroidism TSH 02/2024 normal at 1.070 - Managed on levothyroxine  88 mcg daily  Ascending aorta dilation Echocardiogram 02/2024 showed dilation of acing aorta measuring 40 mm Corresponding coronary CTA on 02/2024 showed the aorta was normal caliber without significant calcifications - CTA confirmed no aneurysm - No follow-up needed at this time      Dispo:  Return in about 3 months (around 06/07/2024).  Signed, Lum LITTIE Louis, NP

## 2024-03-08 NOTE — Patient Instructions (Signed)
 Medication Instructions:  NO CHANGES   Lab Work: NONE TO BE DONE TODAY.   Testing/Procedures: NONE  Follow-Up: At [facility], you and your health needs are our priority.  As part of our continuing mission to provide you with exceptional heart care, our providers are all part of one team.  This team includes your primary Cardiologist (physician) and Advanced Practice Providers or APPs (Physician Assistants and Nurse Practitioners) who all work together to provide you with the care you need, when you need it.  Your next appointment:   3 MONTHS  Provider:   MADISON FOUNTAIN, NP OR TESSA CONTE, PA-C

## 2024-03-09 ENCOUNTER — Other Ambulatory Visit: Payer: Self-pay | Admitting: Family Medicine

## 2024-03-09 ENCOUNTER — Other Ambulatory Visit: Payer: Self-pay

## 2024-03-09 DIAGNOSIS — Z1231 Encounter for screening mammogram for malignant neoplasm of breast: Secondary | ICD-10-CM

## 2024-03-09 MED ORDER — METRONIDAZOLE 250 MG PO TABS: 250.0000 mg | ORAL_TABLET | Freq: Three times a day (TID) | ORAL | 0 refills | Status: AC

## 2024-03-09 NOTE — Telephone Encounter (Signed)
 I spoke with the pt and she agrees to the Flagyl . Prescription has been sent and the pt to call with any issues

## 2024-03-09 NOTE — Telephone Encounter (Signed)
 Left message on machine to call back

## 2024-04-05 NOTE — Progress Notes (Unsigned)
 Office Visit    Patient Name: Nichole Manning Date of Encounter: 04/07/2024  PCP:  Donal Channing SQUIBB, FNP   [facility]  Cardiologist:  Shelda Bruckner, MD  Advanced Practice Provider:  No care team member to display Electrophysiologist:  None } APP: Orren Fabry, PA-C  HPI    Nichole Manning is a 58 y.o. female with a past medical history of anxiety, COPD, hypothyroidism, palpitations, PSVT, PAC, PVC, mild MR presents today for follow-up appointment.  She was last seen in the clinic by Raphael Bring, PA-C 07/04/2022.  History of mild LVH and mild MR on echocardiogram.  Seen for palpitations as well.  Monitor 8/21 with heart rate 55 to 144 bpm, average 79 bpm, predominantly NSR, 1 5 beat SVT, rare less than 1% PACs, PVCs, 2 triggered events with sinus with PVCs.  Lipids managed by primary care.  When she was last seen it was for a preop evaluation for elective blepharoplasty.  She was regularly seen by PCP and found to have an abnormal EKG.  EKG from the PCP visit showed normal sinus rhythm 83 bpm with frequent PVCs, nonspecific ST TW changes otherwise in precordial leads.  EKG at our appointment showed normal sinus rhythm with resolution of ST TW changes anteriorly and no further activity.  She reports occasional palpitations about 1 time per week but not particular bothersome.  Propranolol  helps but she does not take this often.  Noticed occasional dyspnea with exertion or showering.  No chest pain or syncope.  Thyroid  function is followed by endocrinology with lab work just 2 weeks prior therefore they were not repeated.  She was seen by me February 2024, she states she mailed her zio in a few weeks ago and has not heard anything. We reviewed her monitor with her. She has been on spirolactone for years for acne. She smoked for 40 years and quit in 2020. She has a 55 year old child and is worried about heart disease. She does not know her family history since she  is adopted. We discussed the need for an ischemic workup. She was to have a coronary CTA but she had too much ectopy to complete the test.   Ultimately, ETT was performed and she was able to achieve 9.2 METS and was negative.  Did not wish to add a beta-blocker.  On spironolactone  for acne and minoxidil for hair growth.  Requested sleep study due to fatigue which was negative.  She was seen in the ED 10/10/2022 with odd sensation over her left cheek and possible subtle facial droop.  Brain MRI was normal and she was advised to follow-up with her plastic surgeon.  She states she did so and she was told she may have Bell's palsy.  Since April she has been having some issues with her feet and palms sweating.  States she seen her PCP and endocrinologist and told it was possibly stress.  Prescribed a trial of hydroxyzine.  She does have stress at home with a teenage son.  Reports periodically feeling her PVCs described as a pounding sensation.  Walks her dog multiple times a week.  Sometimes is able to do so without any problems at all and other times notices SOB with activity which was described similarly in the past.  She was seen by me 02/2023, she tells me that she has had hypotension when she was seeing her pulmonary doctor. We discussed going down on her spirolactone and eventually discontinue it.  She tells me her recent Thyroid  panel was low: TSH 0.06, T3 59, T4 9.3. No medication changes were made at that time. She is having fatigue, dizzinesss and lightheadedness. Her son has insulin resistance so she tried to avoid high carbohydrate food and keeps an eye on her sugar. She struggles with migraines which manifests as broken lines across her vision.   I saw her December of last year, she presents with a history of smoking, COVID-19 infection, and palpitations with concerns about low blood pressure. The patient reports that her blood pressure has been running low, with readings as low as 102/72. The patient  experiences occasional lightheadedness and dizziness, which she attributes to the low blood pressure. The patient is currently on propranolol  for palpitations and Spiriva for lung issues.  The patient also reports weight gain after stopping spironolactone , which was initially prescribed for high cortisol levels. The patient expresses concern about the need for medication to manage her cortisol levels.  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 The patient reports using 72% of her lung capacity and is considering a lung biopsy. The patient denies experiencing any significant shortness of breath or other respiratory symptoms  Discussed the use of AI scribe software for clinical note transcription with the patient, who gave verbal consent to proceed.  Reports no shortness of breath nor dyspnea on exertion. Reports no chest pain, pressure, or tightness. No edema, orthopnea, PND. Reports no palpitations.  She was recently in the [facility] ED with lower abdominal pain since gallbladder surgery which took place on August 9.  ED visit was August 23.  She was having right sided low back pain and was sent by the surgeon for evaluation.  No fever, not many bowel movements, no nausea or vomiting at that time.  CT of the abdomen pelvis was ordered and showed colonic diverticulosis with no acute diverticulitis as well as aortic atherosclerosis.  Stomach was normal.  No acute abnormalities or complications from surgery noted.  Symptoms suggested myelopathy.  She was placed on naproxen  twice daily for pain and also offered Robaxin  as a muscle relaxer.  I saw her September 2025, she has a history of aortic atherosclerosis who presents with fatigue, swelling, and headaches.  She experiences persistent fatigue and lethargy since her gallbladder removal surgery on January 14, 2024, with poor sleep and feeling unrefreshed upon waking. She has  gained weight and experiences shortness of breath. Her aortic atherosclerosis was noted as severe in a CT scan from January 29, 2024. Her LDL cholesterol was 70 in March 2025, and she is on Crestor  5 mg daily.  She experiences palpitations and skipped heartbeats, particularly at night, which sometimes wake her up. She has a history of low blood pressure and was previously on spironolactone  for cortisol levels and acne for over twenty years. She was taken off spironolactone  due to low blood pressure but is considering restarting it for swelling. Her ankles are swollen, and she mentions no significant new medications except for a temporary prescription for stomach issues, which she does not take.  Reports no shortness of breath nor dyspnea on exertion. Reports no chest pain, pressure, or tightness. No orthopnea, PND.   Discussed the use of AI scribe software for clinical note transcription with the patient, who gave verbal consent to proceed.  Today, she presents with a hx of an ascending aortic aneurysm who presents for cardiovascular follow-up.   She has an ascending  aortic aneurysm, initially measured at 40 mm on echocardiogram. She is concerned about the aneurysm worsening which she is at low risk for based on her BP.   She has a 40-year smoking history but has quit and now finds the smell unpleasant. A CT scan showed a calcium  score of zero, indicating no significant coronary artery disease. She is on Crestor  for cholesterol management, with an LDL of 40 and triglycerides at 176.  Her current medications include spironolactone , lisinopril, and Ozempic. She resumed spironolactone  after gallbladder surgery and is concerned about weight management, unable to get below 130 pounds. She is hesitant to start new medications unless necessary. She is considering moving to [place] , which may affect her insurance and access to cardiology care.  Reports no shortness of breath nor dyspnea on exertion. Reports  no chest pain, pressure, or tightness. No edema, orthopnea, PND. Reports no palpitations.   Discussed the use of AI scribe software for clinical note transcription with the patient, who gave verbal consent to proceed.   Past Medical History    Past Medical History:  Diagnosis Date   Anxiety    Constipation    Fibroids    Headache    migraines   Hyperlipidemia    Hypothyroidism    ILD (interstitial lung disease) (HCC)    Palpitations    PONV (postoperative nausea and vomiting)    mild nausea per pt   PVC's (premature ventricular contractions)    SVD (spontaneous vaginal delivery)    x 3   Past Surgical History:  Procedure Laterality Date   ancillary surgery Bilateral    09/09/23 fat removal   APPENDECTOMY     laparoscopic   AUGMENTATION MAMMAPLASTY     BILATERAL SALPINGECTOMY Bilateral 07/01/2013   Procedure: BILATERAL SALPINGECTOMY;  Surgeon: [name], MD;  Location: [facility];  Service: Gynecology;  Laterality: Bilateral;   BLEPHAROPLASTY Bilateral    BREAST SURGERY     breast reduction x 2   CHOLECYSTECTOMY N/A 01/14/2024   Procedure: LAPAROSCOPIC CHOLECYSTECTOMY;  Surgeon: [name], MD;  Location: [facility];  Service: General;  Laterality: N/A;  LAPAROSCOPIC CHOLECYSTECTOMY WITH ICG   LAPAROSCOPIC APPENDECTOMY N/A 09/09/2020   Procedure: APPENDECTOMY LAPAROSCOPIC;  Surgeon: [name], MD;  Location: [facility];  Service: General;  Laterality: N/A;   TUBAL LIGATION     Tummy tuck     09/09/23   WISDOM TOOTH EXTRACTION      Allergies  Allergies  Allergen Reactions   Sulfa Antibiotics Hives   Amoxicillin Nausea Only   Codeine Nausea And Vomiting    EKGs/Labs/Other Studies Reviewed:   The following studies were reviewed today:  Echocardiogram 07/18/2022 IMPRESSIONS     1. Left ventricular ejection fraction, by estimation, is 60 to 65%. The  left ventricle has normal function. The left ventricle has no regional  wall motion abnormalities. Left  ventricular diastolic parameters were  normal.   2. Right ventricular systolic function is normal. The right ventricular  size is normal.   3. The mitral valve is normal in structure. Trivial mitral valve  regurgitation. No evidence of mitral stenosis.   4. The aortic valve is tricuspid. Aortic valve regurgitation is not  visualized. No aortic stenosis is present.   5. The inferior vena cava is normal in size with greater than 50%  respiratory variability, suggesting right atrial pressure of 3 mmHg.   Comparison(s): No prior Echocardiogram.   FINDINGS   Left Ventricle: Left ventricular ejection fraction, by estimation, is  60  to 65%. The left ventricle has normal function. The left ventricle has no  regional wall motion abnormalities. The left ventricular internal cavity  size was normal in size. There is   no left ventricular hypertrophy. Left ventricular diastolic parameters  were normal.   Right Ventricle: The right ventricular size is normal. Right ventricular  systolic function is normal.   Left Atrium: Left atrial size was normal in size.   Right Atrium: Right atrial size was normal in size.   Pericardium: There is no evidence of pericardial effusion.   Mitral Valve: The mitral valve is normal in structure. Trivial mitral  valve regurgitation. No evidence of mitral valve stenosis.   Tricuspid Valve: The tricuspid valve is normal in structure. Tricuspid  valve regurgitation is trivial. No evidence of tricuspid stenosis.   Aortic Valve: The aortic valve is tricuspid. Aortic valve regurgitation is  not visualized. No aortic stenosis is present.   Pulmonic Valve: The pulmonic valve was normal in structure. Pulmonic valve  regurgitation is trivial. No evidence of pulmonic stenosis.   Aorta: The aortic root is normal in size and structure.   Venous: The inferior vena cava is normal in size with greater than 50%  respiratory variability, suggesting right atrial pressure of  3 mmHg.   IAS/Shunts: No atrial level shunt detected by color flow Doppler.    EKG:  EKG is not ordered today.    Recent Labs: 02/12/2024: TSH 1.070 02/26/2024: ALT 27; BUN 16; Creatinine, Ser 0.97; Hemoglobin 14.6; Platelets 257; Potassium 3.9; Sodium 140  Recent Lipid Panel    Component Value Date/Time   CHOL 137 02/18/2024 1638   TRIG 176 (H) 02/18/2024 1638   HDL 69 02/18/2024 1638   CHOLHDL 2.0 02/18/2024 1638   LDLCALC 40 02/18/2024 1638    Home Medications   Current Meds  Medication Sig   Clindamycin-Benzoyl Per, Refr, gel Apply 1 application topically daily.   cyanocobalamin (,VITAMIN B-12,) 1000 MCG/ML injection Inject 1,000 mcg into the muscle every 28 (twenty-eight) days.   Erenumab -aooe (AIMOVIG ) 140 MG/ML SOAJ Inject 140 mg into the skin every 28 (twenty-eight) days.   estradiol (ESTRACE) 0.5 MG tablet Take 0.5 mg by mouth daily.   levothyroxine  (SYNTHROID ) 88 MCG tablet Take 88 mcg by mouth daily before breakfast.   linaclotide  (LINZESS ) 145 MCG CAPS capsule Take 1 capsule (145 mcg total) by mouth daily.   minoxidil (LONITEN) 2.5 MG tablet Take 2.5 mg by mouth daily.   Multiple Vitamin (MULTIVITAMIN WITH MINERALS) TABS tablet Take 1 tablet by mouth daily.   NURTEC 75 MG TBDP Take 1 tablet by mouth every other day.   omeprazole  (PRILOSEC) 40 MG capsule Take 1 capsule (40 mg total) by mouth 2 (two) times daily.   ondansetron  (ZOFRAN ) 4 MG tablet Take 1 tablet (4 mg total) by mouth every 8 (eight) hours as needed for nausea or vomiting.   ondansetron  (ZOFRAN -ODT) 4 MG disintegrating tablet Take 1 tablet (4 mg total) by mouth every 8 (eight) hours as needed for nausea or vomiting.   OZEMPIC, 0.25 OR 0.5 MG/DOSE, 2 MG/3ML SOPN Inject 0.25 mg as directed once a week.   progesterone (PROMETRIUM) 200 MG capsule Take 200 mg by mouth at bedtime as needed (discomfort).   promethazine  (PHENERGAN ) 25 MG tablet Take 1 tablet (25 mg total) by mouth every 6 (six) hours as needed for  nausea or vomiting.   propranolol  ER (INDERAL  LA) 60 MG 24 hr capsule TAKE 1 CAPSULE BY MOUTH EVERY  DAY   rosuvastatin  (CRESTOR ) 10 MG tablet Take 1 tablet (10 mg total) by mouth daily.   SPIRIVA RESPIMAT 2.5 MCG/ACT AERS Inhale 2 puffs into the lungs daily as needed (shortness of breath).   sucralfate  (CARAFATE ) 1 g tablet Take 1 tablet (1 g total) by mouth 4 (four) times daily -  with meals and at bedtime.   Tenapanor HCl (IBSRELA ) 50 MG TABS Take 50 mg by mouth 2 (two) times daily.   tretinoin (RETIN-A) 0.05 % cream Apply 1 Application topically at bedtime.   venlafaxine XR (EFFEXOR-XR) 37.5 MG 24 hr capsule Take 37.5 mg by mouth at bedtime.   vitamin C (ASCORBIC ACID) 500 MG tablet Take 500 mg by mouth daily.     Review of Systems      All other systems reviewed and are otherwise negative except as noted above.  Physical Exam    VS:  BP 100/62 (BP Location: Left Arm, Patient Position: Sitting, Cuff Size: Normal)   Pulse 80   Resp 16   Ht 5' 5 (1.651 m)   Wt 131 lb 9.6 oz (59.7 kg)   SpO2 97%   BMI 21.90 kg/m  , BMI Body mass index is 21.9 kg/m.  Wt Readings from Last 3 Encounters:  04/07/24 131 lb 9.6 oz (59.7 kg)  03/08/24 131 lb 3.2 oz (59.5 kg)  03/03/24 134 lb (60.8 kg)     GEN: Well nourished, well developed, in no acute distress. HEENT: normal. Neck: Supple, no JVD, carotid bruits, or masses. Cardiac: RRR, no murmurs, rubs, or gallops. No clubbing, cyanosis, edema.  Radials/PT 2+ and equal bilaterally.  Respiratory:  Respirations regular and unlabored, clear to auscultation bilaterally. GI: Soft, nontender, nondistended. MS: No deformity or atrophy. Skin: Warm and dry, no rash. Neuro:  Strength and sensation are intact. Psych: Normal affect.  Assessment & Plan    Ascending aortic aneurysm Aneurysm measures 40 mm, below surgical threshold. CT shows no significant enlargement. Low blood pressure at 100/60 is beneficial. - Schedule annual echocardiogram. -  Advise against heavy weightlifting. - Avoid fluoroquinolone antibiotics.  Ventricular and supraventricular premature depolarizations (PVCs and PACs) with palpitations PVCs and PACs are intermittent, not affecting heart function, and not dangerous at current frequency.  Hyperlipidemia Cholesterol well-managed with Crestor . LDL at 40. Triglycerides at 176, goal below 150. Familial hyperlipidemia considered. - Order lipid panel in April.  Mild, intermittent edema Mild edema possibly due to prolonged standing. No pitting edema. Managed with spironolactone . - Consider compression stockings if standing long periods. - Continue spironolactone .     Disposition: Follow up 6 months with Shelda Bruckner, MD or me.  Signed, Orren LOISE Fabry, PA-C 04/07/2024, 3:53 PM [facility]

## 2024-04-07 ENCOUNTER — Ambulatory Visit: Attending: Physician Assistant | Admitting: Physician Assistant

## 2024-04-07 ENCOUNTER — Encounter: Payer: Self-pay | Admitting: Physician Assistant

## 2024-04-07 VITALS — BP 100/62 | HR 80 | Resp 16 | Ht 65.0 in | Wt 131.6 lb

## 2024-04-07 DIAGNOSIS — R002 Palpitations: Secondary | ICD-10-CM | POA: Insufficient documentation

## 2024-04-07 DIAGNOSIS — E785 Hyperlipidemia, unspecified: Secondary | ICD-10-CM | POA: Diagnosis not present

## 2024-04-07 DIAGNOSIS — E039 Hypothyroidism, unspecified: Secondary | ICD-10-CM | POA: Diagnosis present

## 2024-04-07 DIAGNOSIS — R600 Localized edema: Secondary | ICD-10-CM | POA: Diagnosis present

## 2024-04-07 DIAGNOSIS — I493 Ventricular premature depolarization: Secondary | ICD-10-CM | POA: Diagnosis present

## 2024-04-07 DIAGNOSIS — I471 Supraventricular tachycardia, unspecified: Secondary | ICD-10-CM | POA: Insufficient documentation

## 2024-04-07 DIAGNOSIS — I700 Atherosclerosis of aorta: Secondary | ICD-10-CM | POA: Insufficient documentation

## 2024-04-07 DIAGNOSIS — I959 Hypotension, unspecified: Secondary | ICD-10-CM | POA: Diagnosis present

## 2024-04-07 DIAGNOSIS — Z79899 Other long term (current) drug therapy: Secondary | ICD-10-CM | POA: Insufficient documentation

## 2024-04-07 NOTE — Patient Instructions (Signed)
 Medication Instructions:  Your physician recommends that you continue on your current medications as directed. Please refer to the Current Medication list given to you today.   *If you need a refill on your cardiac medications before your next appointment, please call your pharmacy*  Lab Work: LIPID panel in 6 months  If you have labs (blood work) drawn today and your tests are completely normal, you will receive your results only by: MyChart Message (if you have MyChart) OR A paper copy in the mail If you have any lab test that is abnormal or we need to change your treatment, we will call you to review the results.  Testing/Procedures: None  Follow-Up: At [facility], you and your health needs are our priority.  As part of our continuing mission to provide you with exceptional heart care, our providers are all part of one team.  This team includes your primary Cardiologist (physician) and Advanced Practice Providers or APPs (Physician Assistants and Nurse Practitioners) who all work together to provide you with the care you need, when you need it.  Your next appointment:   6 month(s)  Provider:   Orren Fabry, [facility]

## 2024-04-08 ENCOUNTER — Ambulatory Visit
Admission: RE | Admit: 2024-04-08 | Discharge: 2024-04-08 | Disposition: A | Discharge status: Home / Self Care | Source: Ambulatory Visit | Attending: Family Medicine | Admitting: Family Medicine

## 2024-04-08 ENCOUNTER — Other Ambulatory Visit: Payer: Self-pay | Admitting: Family Medicine

## 2024-04-08 DIAGNOSIS — Z1231 Encounter for screening mammogram for malignant neoplasm of breast: Secondary | ICD-10-CM | POA: Insufficient documentation

## 2024-04-21 ENCOUNTER — Encounter: Payer: Self-pay | Admitting: Neurology

## 2024-04-21 NOTE — Progress Notes (Signed)
 Chief Complaint: Bile reflux and constipation  HPI:    Nichole Manning is a 58 year old female, known to Dr. Avram, with a past medical history as listed below including interstitial lung disease, PVCs, anxiety, constipation and multiple others, who was referred to me by Donal Channing SQUIBB, FNP for a complaint of bile reflux and constipation.      May 2025 EGD and colonoscopy.  Colonoscopy for screening and EGD for reflux.  Colonoscopy with diverticulosis and 1 hyperplastic polyp.  EGD with gastritis and mild reactive gastropathy on biopsies.  Negative for H. pylori.    Ultrasound and HIDA scan with ultimate cholecystectomy in August.    03/03/2024 office visit with Harlene Crigler discussed constipation.  Typically on Linzess  145 mcg daily.  At that time discussed she had 2 CT scans and abdominal x-ray since her cholecystectomy with no cause for symptoms and no significant stool burden.  She was given samples of Ibsrela  50 mg twice daily to see if it helped.  Otherwise treat empirically with Xifaxan  550 3 times daily x 14 days for SIBO.  She was on Ozempic.    Today, the patient presents to clinic and tells me that she just feels awful, often times she does not want to get out of her bed due to feeling so bad.  Describes that all of this started after her colonoscopy in May.  Since then she has had her gallbladder out and continues with issues.  Her biggest being what she describes as bile reflux, describes that she cannot sleep because she will wake up with a burning in her chest and the most awful taste in her mouth.  Tells me that it almost tastes like her feces smells.  The only way she is able to have a bowel movement is with Linzess  145 mcg, when she takes that it gives her 30 minutes of diarrhea and then she is emptied.  This seems to come in small amounts.  Currently taking Carafate  just once a day in the morning along with her Omeprazole  40 mg, though she does take this twice daily.  She never  tried the Ibsrela  or the Xifaxan  given that she has an aneurysm and was told by her cardiologist not to take certain types of antibiotics.  She is back on Ozempic.  Apparently tells me she was off of this for a couple of months, but it is around the timeframe of when she was told she needed her gallbladder out.  Tells me she is no longer losing weight with this medicine.    Denies fever, chills, weight loss or blood in her stool.  Past Medical History:  Diagnosis Date   Anxiety    Constipation    Fibroids    Headache    migraines   Hyperlipidemia    Hypothyroidism    ILD (interstitial lung disease) (HCC)    Palpitations    PONV (postoperative nausea and vomiting)    mild nausea per pt   PVC's (premature ventricular contractions)    SVD (spontaneous vaginal delivery)    x 3    Past Surgical History:  Procedure Laterality Date   ancillary surgery Bilateral    09/09/23 fat removal   APPENDECTOMY     laparoscopic   AUGMENTATION MAMMAPLASTY     BILATERAL SALPINGECTOMY Bilateral 07/01/2013   Procedure: BILATERAL SALPINGECTOMY;  Surgeon: [name], MD;  Location: [facility];  Service: Gynecology;  Laterality: Bilateral;   BLEPHAROPLASTY Bilateral    BREAST SURGERY  breast reduction x 2   CHOLECYSTECTOMY N/A 01/14/2024   Procedure: LAPAROSCOPIC CHOLECYSTECTOMY;  Surgeon: [name], MD;  Location: [facility];  Service: General;  Laterality: N/A;  LAPAROSCOPIC CHOLECYSTECTOMY WITH ICG   LAPAROSCOPIC APPENDECTOMY N/A 09/09/2020   Procedure: APPENDECTOMY LAPAROSCOPIC;  Surgeon: [name], MD;  Location: [facility];  Service: General;  Laterality: N/A;   TUBAL LIGATION     Tummy tuck     09/09/23   WISDOM TOOTH EXTRACTION      Current Outpatient Medications  Medication Sig Dispense Refill   Clindamycin-Benzoyl Per, Refr, gel Apply 1 application topically daily.     cyanocobalamin (,VITAMIN B-12,) 1000 MCG/ML injection Inject 1,000 mcg into the muscle every 28 (twenty-eight)  days.     Erenumab -aooe (AIMOVIG ) 140 MG/ML SOAJ Inject 140 mg into the skin every 28 (twenty-eight) days. 1.12 mL 5   estradiol (ESTRACE) 0.5 MG tablet Take 0.5 mg by mouth daily.     levothyroxine  (SYNTHROID ) 88 MCG tablet Take 88 mcg by mouth daily before breakfast.     linaclotide  (LINZESS ) 145 MCG CAPS capsule Take 1 capsule (145 mcg total) by mouth daily.     minoxidil (LONITEN) 2.5 MG tablet Take 2.5 mg by mouth daily.     Multiple Vitamin (MULTIVITAMIN WITH MINERALS) TABS tablet Take 1 tablet by mouth daily.     NURTEC 75 MG TBDP Take 1 tablet by mouth every other day.     omeprazole  (PRILOSEC) 40 MG capsule Take 1 capsule (40 mg total) by mouth 2 (two) times daily. 60 capsule 3   ondansetron  (ZOFRAN ) 4 MG tablet Take 1 tablet (4 mg total) by mouth every 8 (eight) hours as needed for nausea or vomiting. 20 tablet 0   ondansetron  (ZOFRAN -ODT) 4 MG disintegrating tablet Take 1 tablet (4 mg total) by mouth every 8 (eight) hours as needed for nausea or vomiting. 12 tablet 0   OZEMPIC, 0.25 OR 0.5 MG/DOSE, 2 MG/3ML SOPN Inject 0.25 mg as directed once a week.     progesterone (PROMETRIUM) 200 MG capsule Take 200 mg by mouth at bedtime as needed (discomfort).     promethazine  (PHENERGAN ) 25 MG suppository Place 1 suppository (25 mg total) rectally every 6 (six) hours as needed for nausea or vomiting. (Patient not taking: Reported on 03/08/2024) 12 each 0   promethazine  (PHENERGAN ) 25 MG tablet Take 1 tablet (25 mg total) by mouth every 6 (six) hours as needed for nausea or vomiting. 30 tablet 0   propranolol  ER (INDERAL  LA) 60 MG 24 hr capsule TAKE 1 CAPSULE BY MOUTH EVERY DAY 90 capsule 2   rosuvastatin  (CRESTOR ) 10 MG tablet Take 1 tablet (10 mg total) by mouth daily. 90 tablet 3   SPIRIVA RESPIMAT 2.5 MCG/ACT AERS Inhale 2 puffs into the lungs daily as needed (shortness of breath).     spironolactone  (ALDACTONE ) 25 MG tablet Take 1 tablet (25 mg total) by mouth daily. (Patient not taking:  Reported on 04/07/2024) 90 tablet 3   sucralfate  (CARAFATE ) 1 g tablet Take 1 tablet (1 g total) by mouth 4 (four) times daily -  with meals and at bedtime. 120 tablet 0   Tenapanor HCl (IBSRELA ) 50 MG TABS Take 50 mg by mouth 2 (two) times daily.     tretinoin (RETIN-A) 0.05 % cream Apply 1 Application topically at bedtime.     venlafaxine XR (EFFEXOR-XR) 37.5 MG 24 hr capsule Take 37.5 mg by mouth at bedtime.     vitamin C (ASCORBIC  ACID) 500 MG tablet Take 500 mg by mouth daily.     No current facility-administered medications for this visit.    Allergies as of 04/25/2024 - Review Complete 04/07/2024  Allergen Reaction Noted   Sulfa antibiotics Hives 06/20/2013   Amoxicillin Nausea Only 01/29/2023   Codeine Nausea And Vomiting 06/20/2013    Family History  Adopted: Yes  Problem Relation Age of Onset   Breast cancer Mother 54   Cancer Father        bile duct cancer died in his 84's   Colon polyps Neg Hx    Colon cancer Neg Hx    Esophageal cancer Neg Hx    Stomach cancer Neg Hx    Rectal cancer Neg Hx     Social History   Socioeconomic History   Marital status: Married    Spouse name: Not on file   Number of children: 2   Years of education: Not on file   Highest education level: Not on file  Occupational History   Not on file  Tobacco Use   Smoking status: Former    Current packs/day: 1.50    Average packs/day: 1.5 packs/day for 30.0 years (45.0 ttl pk-yrs)    Types: Cigarettes   Smokeless tobacco: Never   Tobacco comments:    20 cigarettes per day 11/16/19 [facility]   Vaping Use   Vaping status: Former   Quit date: 10/08/2023  Substance and Sexual Activity   Alcohol use: Not Currently    Comment: social   Drug use: No   Sexual activity: Yes    Birth control/protection: Surgical  Other Topics Concern   Not on file  Social History Narrative   Divorced 1 son and 1 daughter   2-story home   Left handed   1 caffeinated beverage daily   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   There is a history of vaping   No alcohol   No drug use   Social Drivers of Corporate Investment Banker Strain: Not on file  Food Insecurity: Not on file  Transportation Needs: Not on file  Physical Activity: Not on file  Stress: Not on file  Social Connections: Not on file  Intimate Partner Violence: Not on file    Review of Systems:    Constitutional: No weight loss, fever or chills Cardiovascular: No chest pain Respiratory: No SOB  Gastrointestinal: See HPI and otherwise negative   Physical Exam:  Vital signs: BP 90/60   Pulse 76   Ht 5' 4 (1.626 m)   Wt 134 lb 2 oz (60.8 kg)   BMI 23.02 kg/m    Constitutional:   Pleasant Caucasian female appears to be in NAD, Well developed, Well nourished, alert and cooperative Respiratory: Respirations even and unlabored. Lungs clear to auscultation bilaterally.   No wheezes, crackles, or rhonchi.  Cardiovascular: Normal S1, S2. No MRG. Regular rate and rhythm. No peripheral edema, cyanosis or pallor.  Gastrointestinal:  Soft, nondistended, nontender. No rebound or guarding. Normal bowel sounds. No appreciable masses or hepatomegaly. Rectal:  Not performed.  Psychiatric: Demonstrates good judgement and reason without abnormal affect or behaviors.+ Anxious and worried  RELEVANT LABS AND IMAGING: CBC    Component Value Date/Time   WBC 10.0 02/26/2024 1428   RBC 4.52 02/26/2024 1428   HGB 14.6 02/26/2024 1428   HGB 13.6 03/02/2023 1432   HCT 43.7 02/26/2024 1428   HCT 40.9 03/02/2023 1432   PLT 257 02/26/2024 1428  PLT 270 03/02/2023 1432   MCV 96.7 02/26/2024 1428   MCV 97 03/02/2023 1432   MCH 32.3 02/26/2024 1428   MCHC 33.4 02/26/2024 1428   RDW 12.6 02/26/2024 1428   RDW 12.3 03/02/2023 1432   LYMPHSABS 2.8 11/27/2023 1019   LYMPHSABS 3.7 (H) 10/10/2019 1600   MONOABS 0.6 11/27/2023 1019   EOSABS 0.1 11/27/2023 1019   EOSABS 0.1 10/10/2019 1600   BASOSABS 0.1 11/27/2023 1019    BASOSABS 0.1 10/10/2019 1600    CMP     Component Value Date/Time   NA 140 02/26/2024 1540   NA 139 02/18/2024 1638   K 3.9 02/26/2024 1540   CL 104 02/26/2024 1540   CO2 25 02/26/2024 1540   GLUCOSE 94 02/26/2024 1540   BUN 16 02/26/2024 1540   BUN 15 02/18/2024 1638   CREATININE 0.97 02/26/2024 1540   CALCIUM  9.3 02/26/2024 1540   PROT 6.7 02/26/2024 1540   PROT 6.7 08/21/2023 1554   ALBUMIN  4.2 02/26/2024 1540   ALBUMIN  4.4 08/21/2023 1554   AST 32 02/26/2024 1540   ALT 27 02/26/2024 1540   ALKPHOS 73 02/26/2024 1540   BILITOT 0.3 02/26/2024 1540   BILITOT 0.4 08/21/2023 1554   GFRNONAA >60 02/26/2024 1540   GFRAA 85 10/10/2019 1600    Assessment: 1.  GERD: EGD in May with gastritis and mild reactive gastropathy, continued symptoms of what sounds like bile reflux, worse over the past month or so, she is on Ozempic which could definitely be contributing, status post cholecystectomy in August; question whether she has underlying gastroparesis as well 2.  Constipation: Relieved with Linzess  145 mcg daily, but this gives her watery stools, a lesser dose does not work for her; consider medication side effect +/- IBS-C 3.  Status post cholecystectomy 4.  Bloating: Consider relation to SIBO, previously tried to treat her with Xifaxan  but she cannot take certain antibiotics given aneurysm per her cardiologist  Plan: 1.  Refill Linzess  145 mcg daily #30 with 5 refills 2.  Refill Carafate  1 g 4 times daily.  Discussed timing of this medication an hour before 2 hours after other meds.  Would recommend she try and get this in 4 times a day if she can. 3.  Continue Omeprazole  40 mg twice a day, 30-60 minutes for breakfast and dinner #60 with 5 refills 4.  Ordered a SIBO breath test.  If patient has SIBO will need to figure out which antibiotic she can use to treat it. 5.  Patient told to stop her Ozempic.  She will hold this for the next 2 months.  At that point if still with symptoms  would recommend a gastric emptying study. 6.  Patient to follow in clinic with me in 6 to 8 weeks or after gastric emptying study  Delon Failing, PA-C [facility] 04/21/2024, 9:28 AM  Cc: Donal Channing SQUIBB, FNP

## 2024-04-25 ENCOUNTER — Ambulatory Visit: Admitting: Physician Assistant

## 2024-04-25 ENCOUNTER — Encounter: Payer: Self-pay | Admitting: Physician Assistant

## 2024-04-25 VITALS — BP 90/60 | HR 76 | Ht 64.0 in | Wt 134.1 lb

## 2024-04-25 DIAGNOSIS — K5900 Constipation, unspecified: Secondary | ICD-10-CM

## 2024-04-25 DIAGNOSIS — R12 Heartburn: Secondary | ICD-10-CM

## 2024-04-25 DIAGNOSIS — K219 Gastro-esophageal reflux disease without esophagitis: Secondary | ICD-10-CM

## 2024-04-25 MED ORDER — SUCRALFATE 1 G PO TABS: 1.0000 g | ORAL_TABLET | Freq: Three times a day (TID) | ORAL | 5 refills | Status: AC

## 2024-04-25 MED ORDER — LINACLOTIDE 145 MCG PO CAPS: 145.0000 ug | ORAL_CAPSULE | Freq: Every day | ORAL | 5 refills | Status: AC

## 2024-04-25 MED ORDER — OMEPRAZOLE 40 MG PO CPDR: 40.0000 mg | DELAYED_RELEASE_CAPSULE | Freq: Two times a day (BID) | ORAL | 5 refills | Status: AC

## 2024-04-25 NOTE — Patient Instructions (Addendum)
 You have been given a testing kit to check for small intestine bacterial overgrowth (SIBO) which is completed by a company named [facility]. Make sure to return your test in the mail using the return mailing label given to you along with the kit. The test order, your demographic and insurance information have all already been sent to the company. [facility] will collect an upfront charge of $109.00 for commercial insurance plans and $229.00 if you are paying cash. The potential remaining total after claim submission and review is $120.00. Make sure to discuss with [facility] PRIOR to having the test to see if they have gotten information from your insurance company as to how much your testing will cost out of pocket, if any. Please contact [facility] at phone number [phone] to get instructions regarding how to perform the test as our office is unable to give specific testing instructions.    Stop Ozempic and call provider in 6 to 8 weeks with update.  We have sent the following medications to your pharmacy for you to pick up at your convenience: Linzess  145 mcg Daily before breakfast Omeprazole  40 mg twice daily before breakfast and dinner Carafate  1 gram four times daily. 1 hour before or 2 hours after other medications

## 2024-05-27 ENCOUNTER — Telehealth: Payer: Self-pay | Admitting: Pharmacy Technician

## 2024-05-27 ENCOUNTER — Ambulatory Visit: Attending: Emergency Medicine | Admitting: Emergency Medicine

## 2024-05-27 ENCOUNTER — Encounter: Payer: Self-pay | Admitting: Emergency Medicine

## 2024-05-27 ENCOUNTER — Other Ambulatory Visit (HOSPITAL_COMMUNITY): Payer: Self-pay

## 2024-05-27 VITALS — BP 104/70 | HR 69 | Ht 64.0 in | Wt 134.0 lb

## 2024-05-27 DIAGNOSIS — I700 Atherosclerosis of aorta: Secondary | ICD-10-CM | POA: Diagnosis not present

## 2024-05-27 DIAGNOSIS — R600 Localized edema: Secondary | ICD-10-CM | POA: Insufficient documentation

## 2024-05-27 DIAGNOSIS — I493 Ventricular premature depolarization: Secondary | ICD-10-CM | POA: Insufficient documentation

## 2024-05-27 DIAGNOSIS — Z01810 Encounter for preprocedural cardiovascular examination: Secondary | ICD-10-CM | POA: Insufficient documentation

## 2024-05-27 DIAGNOSIS — E785 Hyperlipidemia, unspecified: Secondary | ICD-10-CM | POA: Diagnosis not present

## 2024-05-27 DIAGNOSIS — E039 Hypothyroidism, unspecified: Secondary | ICD-10-CM | POA: Insufficient documentation

## 2024-05-27 MED ORDER — SPIRONOLACTONE 25 MG PO TABS: 12.5000 mg | ORAL_TABLET | Freq: Every day | ORAL | 3 refills | Status: AC

## 2024-05-27 NOTE — Telephone Encounter (Signed)
 Pharmacy Patient Advocate Encounter   Received notification from CoverMyMeds that prior authorization for AIMOVIG  140MG  is required/requested.   Insurance verification completed.   The patient is insured through [facility] MEDICAID.   Per test claim: PA required; PA submitted to above mentioned insurance via Latent Key/confirmation #/EOC [id number] Status is pending

## 2024-05-27 NOTE — Progress Notes (Signed)
 " Cardiology Office Note:    Date:  05/27/2024  ID:  Nichole Manning, DOB 1965/06/11, MRN [id number] PCP: Nichole Channing SQUIBB, FNP  [facility] Providers Cardiologist:  Nichole Bruckner, MD       Patient Profile:       Chief Complaint: Acute visit for fatigue and lethargy History of Present Illness:  Nichole Manning is a 58 y.o. female with visit-pertinent history of anxiety, COPD, hypothyroidism, palpitations, PSVT, PAC, PVC, mild MR   Cardiac monitor 01/2020 showed heart rate 55 through 144 bpm, average 79 bpm, NSR, 5 PSVT, rare PACs PVCs.  She was seen in clinic on 06/2022 for preop evaluation was found to have abnormal EKG and exertional dyspnea.  EKG obtained from PCP visit showed NSR with frequent PVCs, nonspecific STTW changes otherwise in precordial leads though possibly due to lead placement.  3-day ZIO 07/2022 showed 2.2% PVCs, less than 1% PACs, 3 short runs of SVT.  Echocardiogram 07/2022 showed LVEF 60 to 65%, trivial MR.  Coronary CTA was unable to be performed due to her PVCs, so ETT was performed instead which was read as negative/low risk achieving 9.2 METS.  Sleep study due to fatigue was negative.   Seen in clinic by Nichole Manning on 02/12/2024.  She had noted persistent fatigue and lethargy since her gallbladder removal in January 14, 2024 with poor sleep.  She is gaining weight and experienced shortness of breath.  She was restarted on spironolactone  25 mg daily.  She was referred to endocrinologist due to persistent fatigue and lethargy.   Coronary CTA 02/18/2024 showed coronary calcium  score of 0 and no evidence of CAD.  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  Seen in clinic on 03/08/2024.  She was doing well without anginal symptoms.  Her LV had improved after restarting spironolactone .  No further changes are  made.  Last seen in clinic on 04/07/2024 by Nichole Manning.  She was doing well at that time and no changes were made.   Discussed the use of AI scribe software for clinical note transcription with the patient, who gave verbal consent to proceed.  History of Present Illness Nichole Manning is a 58 year old female who presents today with ongoing fatigue and complaints of low blood pressures.  She is concerned about low blood pressure, with readings of 104/70 mmHg today and 90/60 mmHg at a visit last month. She wonders if spironolactone  is contributing to her low blood pressure and fatigue.  She reports that the spironolactone  is not greatly benefiting her edema.  She has yet to follow-up with endocrinology.  She has fatigue and low energy chronically but this seems to worsen.  She takes spironolactone  for cortisol and ankle swelling, minoxidil for hair growth, and propranolol  for palpitations.   She is scheduled for breast implant removal in January for capsular contracture and rupture and asks if she is cardiologically fit for surgery.  She is without any palpitations or episodes of tachycardia.  She denies chest pains, dyspnea, orthopnea, PND, weight gain, syncope, presyncope, lightheadedness, dizziness   Review of systems:  Please see the history of present illness. All other systems are reviewed and otherwise negative.      Studies Reviewed:        Echocardiogram 03/04/2024  1. Left ventricular ejection fraction, by estimation, is 60 to 65%. Left  ventricular  ejection fraction by 3D volume is 69 %. The left ventricle has  normal function. The left ventricle has no regional wall motion  abnormalities. Left ventricular diastolic   parameters were normal.   2. Right ventricular systolic function is normal. The right ventricular  size is normal.   3. The mitral valve is normal in structure. Trivial mitral valve  regurgitation.   4. The aortic valve was not well visualized. Aortic  valve regurgitation  is mild. No aortic stenosis is present.   5. Aortic dilatation noted. There is dilatation of the ascending aorta,  measuring 40 mm.   6. The inferior vena cava is normal in size with greater than 50%  respiratory variability, suggesting right atrial pressure of 3 mmHg.    Coronary CTA 02/18/2024 1. Coronary calcium  score of 0. This was 1st percentile for age, sex, and race matched control.   2. Normal coronary origin with right dominance.   3. CAD-RADS 0. No evidence of CAD (0%). Consider non-atherosclerotic causes of chest pain.   Exercise tolerance test 08/05/2022   Good exercise capacity, achieved 9.2 METS   Peak heart rate 166 bpm (101% max age-predicted heart rate)   Normal blood pressure response to exercise   up sloping ST depression was noted.  No evidence of ischemia.   Low risk study   Echocardiogram 07/18/2022  1. Left ventricular ejection fraction, by estimation, is 60 to 65%. The  left ventricle has normal function. The left ventricle has no regional  wall motion abnormalities. Left ventricular diastolic parameters were  normal.   2. Right ventricular systolic function is normal. The right ventricular  size is normal.   3. The mitral valve is normal in structure. Trivial mitral valve  regurgitation. No evidence of mitral stenosis.   4. The aortic valve is tricuspid. Aortic valve regurgitation is not  visualized. No aortic stenosis is present.   5. The inferior vena cava is normal in size with greater than 50%  respiratory variability, suggesting right atrial pressure of 3 mmHg.    Zio 07/11/2022 Patch Wear Time:  3 days and 8 hours (2024-01-26T15:22:25-0500 to 2024-01-29T23:56:35-0500)   Patient had a min HR of 65 bpm, max HR of 169 bpm, and avg HR of 89 bpm. Predominant underlying rhythm was Sinus Rhythm. 3 Supraventricular Tachycardia runs occurred, the run with the fastest interval lasting 11 beats with a max rate of 169 bpm, the longest lasting  13 beats with an avg rate of 149 bpm. No VT, atrial fibrillation, high degree block, or pauses noted. Isolated atrial ventricular ectopy was rare (<1%), isolated ventricular ectopy was occasional (2.2%). There were 0 triggered events. No high risk arrhythmias detected.   Risk Assessment/Calculations:             Physical Exam:   VS:  BP 104/70 (BP Location: Left Arm, Patient Position: Sitting, Cuff Size: Normal)   Pulse 69   Ht 5' 4 (1.626 m)   Wt 134 lb (60.8 kg)   BMI 23.00 kg/m    Wt Readings from Last 3 Encounters:  05/27/24 134 lb (60.8 kg)  04/25/24 134 lb 2 oz (60.8 kg)  04/07/24 131 lb 9.6 oz (59.7 kg)    GEN: Well nourished, well developed in no acute distress NECK: No JVD; No carotid bruits CARDIAC: RRR, no murmurs, rubs, gallops RESPIRATORY:  Clear to auscultation without rales, wheezing or rhonchi  ABDOMEN: Soft, non-tender, non-distended EXTREMITIES:  No edema; No acute deformity      Assessment  and Plan:  Fatigue and lethargy Has been persistent but worsened over the past several weeks.  Patient concerned about recent low blood pressure readings.  Noted to be 90/60 last week at GI visit and 104/70 today - Cardiac testing up to this point has been reassuring.  She is without anginal symptoms.  She denies any lightheadedness, dizziness or syncope.  No indication for further ischemic evaluation at this time - Thyroid  function 02/2024 was WNL - Plan to lower spironolactone  to 12.5 mg daily (does not note much benefit with ankle edema).  If symptoms persist can consider decreasing propranolol  dosage - She plans to follow-up with endocrinology for possible adrenal disorder  Aortic atherosclerosis Coronary CTA 02/2024 with coronary calcium  score of 0 and aorta without significant calcifications - Today she remains without anginal symptoms - Her LDL is well-controlled - Encouraged heart healthy dieting and physical exercise - Continue rosuvastatin  10 mg daily    Hyperlipidemia, LDL goal <70 LDL 40 on 02/2024 and well-controlled - Continue rosuvastatin  10 mg daily   Occasional PVCs ZIO 07/2022 with 2.2% PVC burden Quiescent and well-controlled on current beta-blocker therapy - Continue propranolol  ER 60 mg daily   Lower extremity edema Echocardiogram 02/2024 with LVEF 60 to 65%, no RWMA, normal diastolic parameters No changes to chronic LEE on MRA - Recommended lower extremity stockings, leg elevation, and low-sodium dieting - Decreasing spironolactone  to 12.5 mg daily as noted above due to low blood pressure readings   Hypothyroidism TSH 02/2024 normal at 1.070 - Managed on levothyroxine  88 mcg daily   Ascending aorta dilation Echocardiogram 02/2024 showed dilation of ascending aorta measuring 40 mm Coronary CTA on 02/2024 showed the aorta was normal caliber - Would recommend echocardiogram or CTA aorta x 1 year for routine evaluation  Preoperative cardiovascular clearance Breast implant removal in January 2026  According to the Revised Cardiac Risk Index (RCRI), her Perioperative Risk of Major Cardiac Event is (%): 0.4. Her Functional Capacity in METs is: 6.61 according to the Duke Activity Status Index (DASI).  Therefore, based on ACC/AHA guidelines, patient would be at acceptable risk for the planned procedure without further cardiovascular testing. I will route this recommendation to the requesting party via Epic fax function.      Dispo:  Return in about 5 months with [place], Manning  Signed, Lum LITTIE Louis, NP  "

## 2024-05-27 NOTE — Telephone Encounter (Signed)
 Pharmacy Patient Advocate Encounter  Received notification from [employer] that Prior Authorization for AIMOVIG  140MG  has been APPROVED from 12.19.25 to 12.19.26. Ran test claim, Copay is $4. This test claim was processed through [facility]- copay amounts may vary at other pharmacies due to pharmacy/plan contracts, or as the patient moves through the different stages of their insurance plan.   PA #/Case ID/Reference #: [id number]

## 2024-05-27 NOTE — Patient Instructions (Addendum)
 Medication Instructions:  SPIRONOLACTONE  TO 12.5 MG DAILY.  Lab Work: NONE TO BE DONE TODAY  Testing/Procedures: NONE  Follow-Up: At [facility], you and your health needs are our priority.  As part of our continuing mission to provide you with exceptional heart care, our providers are all part of one team.  This team includes your primary Cardiologist (physician) and Advanced Practice Providers or APPs (Physician Assistants and Nurse Practitioners) who all work together to provide you with the care you need, when you need it.  Your next appointment:   MAY 2026 TO ESTABLISH CARE WITH TESSA CONTE  Provider:   ORREN CONTE,PA  OTHER: PLEASE ESTABLISH CARE WITH ENDOCRINOLOGY.

## 2024-06-01 ENCOUNTER — Ambulatory Visit: Admitting: Neurology

## 2024-06-10 ENCOUNTER — Telehealth (HOSPITAL_BASED_OUTPATIENT_CLINIC_OR_DEPARTMENT_OTHER): Payer: Self-pay | Admitting: *Deleted

## 2024-06-10 NOTE — Telephone Encounter (Signed)
"  ° °  Pre-operative Risk Assessment    Patient Name: Nichole Manning  DOB: 30-Jun-1965 MRN: [id number]   Date of last office visit: 05/27/24 MADISON FOUNTAIN, NP Date of next office visit: 10/18/24 ORREN FABRY, [facility]   Request for Surgical Clearance    Procedure:  B/L OPEN CAPSULECTOMY  WITH EXPLANTATION AND MASTOPEXY, REVISE BELLY BUTTON SCAR  Date of Surgery:  Clearance 06/30/24                                Surgeon:  DR. GLENDIA DOLLAR Surgeon's Group or Practice Name:  [facility] Phone number:  [phone] Fax number:  [phone]   Type of Clearance Requested:   - Medical    Type of Anesthesia:  General  or IV SEDATION   Additional requests/questions:    Bonney Niels Jest   06/10/2024, 12:42 PM   "

## 2024-06-10 NOTE — Telephone Encounter (Signed)
 Patient recently seen in clinic on 05/27/2024 by Lum Louis, NP, preoperative cardiac evaluation addressed in office notes.  This has been faxed to requesting office.

## 2024-06-17 NOTE — Progress Notes (Unsigned)
 "  Chief Complaint: Follow-up bile reflux and constipation  HPI:    Nichole Manning is a 59 year old Caucasian female with a past medical history as listed below including ILD, PVCs, anxiety, constipation multiple others, known to Dr. Avram, who returns to clinic today for follow-up of bile reflux and constipation.       May 2025 EGD and colonoscopy.  Colonoscopy for screening and EGD for reflux.  Colonoscopy with diverticulosis and 1 hyperplastic polyp.  EGD with gastritis and mild reactive gastropathy on biopsies.  Negative for H. pylori.    Ultrasound and HIDA scan with ultimate cholecystectomy in August.    03/03/2024 office visit with [name] discussed constipation.  Typically on Linzess  145 mcg daily.  At that time discussed she had 2 CT scans and abdominal x-ray since her cholecystectomy with no cause for symptoms and no significant stool burden.  She was given samples of Ibsrela  50 mg twice daily to see if it helped.  Otherwise treat empirically with Xifaxan  550 3 times daily x 14 days for SIBO.  She was on Ozempic.    04/25/2024 patient seen in clinic by me and at that time described feeling awful.  Biggest symptom was bile reflux.  Having bowel movements with Linzess  145 mcg daily.  She was on Carafate  once a day in the morning along with her Omeprazole  40 mg twice daily.  She never tried Ibsrela  or Xifaxan  given that she has an aneurysm and was told by cardiologist to avoid certain antibiotics.  She was on Ozempic.  At that visit refill Linzess  145, refill Carafate  1 g 4 times daily, continue Omeprazole  40 twice daily and ordered SIBO breath testing.  Told her to stop Ozempic for the next 2 months to see if it helped, if symptoms continue recommend a gastric emptying study.  Past Medical History:  Diagnosis Date   Anxiety    Constipation    Fibroids    Headache    migraines   Hyperlipidemia    Hypothyroidism    ILD (interstitial lung disease) (HCC)    Palpitations    PONV  (postoperative nausea and vomiting)    mild nausea per pt   PVC's (premature ventricular contractions)    Small intestinal bacterial overgrowth (SIBO) 2025   SVD (spontaneous vaginal delivery)    x 3    Past Surgical History:  Procedure Laterality Date   ancillary surgery Bilateral    09/09/23 fat removal   APPENDECTOMY     laparoscopic   AUGMENTATION MAMMAPLASTY     BILATERAL SALPINGECTOMY Bilateral 07/01/2013   Procedure: BILATERAL SALPINGECTOMY;  Surgeon: [name], MD;  Location: [facility];  Service: Gynecology;  Laterality: Bilateral;   BLEPHAROPLASTY Bilateral    BREAST SURGERY     breast reduction x 2   CHOLECYSTECTOMY N/A 01/14/2024   Procedure: LAPAROSCOPIC CHOLECYSTECTOMY;  Surgeon: [name], MD;  Location: [facility];  Service: General;  Laterality: N/A;  LAPAROSCOPIC CHOLECYSTECTOMY WITH ICG   LAPAROSCOPIC APPENDECTOMY N/A 09/09/2020   Procedure: APPENDECTOMY LAPAROSCOPIC;  Surgeon: [name], MD;  Location: [facility];  Service: General;  Laterality: N/A;   TUBAL LIGATION     Tummy tuck     09/09/23   WISDOM TOOTH EXTRACTION      Current Outpatient Medications  Medication Sig Dispense Refill   Clindamycin-Benzoyl Per, Refr, gel Apply 1 application topically daily.     cyanocobalamin (,VITAMIN B-12,) 1000 MCG/ML injection Inject 1,000 mcg into the muscle every 28 (twenty-eight) days.  Erenumab -aooe (AIMOVIG ) 140 MG/ML SOAJ Inject 140 mg into the skin every 28 (twenty-eight) days. 1.12 mL 5   estradiol (ESTRACE) 0.5 MG tablet Take 0.5 mg by mouth daily.     levothyroxine  (SYNTHROID ) 88 MCG tablet Take 88 mcg by mouth daily before breakfast.     linaclotide  (LINZESS ) 145 MCG CAPS capsule Take 1 capsule (145 mcg total) by mouth daily. 30 capsule 5   minoxidil (LONITEN) 2.5 MG tablet Take 2.5 mg by mouth daily.     Multiple Vitamin (MULTIVITAMIN WITH MINERALS) TABS tablet Take 1 tablet by mouth daily.     NURTEC 75 MG TBDP Take 1 tablet by mouth every other  day.     omeprazole  (PRILOSEC) 40 MG capsule Take 1 capsule (40 mg total) by mouth 2 (two) times daily. (Patient taking differently: Take 40 mg by mouth as needed.) 60 capsule 5   ondansetron  (ZOFRAN ) 4 MG tablet Take 1 tablet (4 mg total) by mouth every 8 (eight) hours as needed for nausea or vomiting. 20 tablet 0   ondansetron  (ZOFRAN -ODT) 4 MG disintegrating tablet Take 1 tablet (4 mg total) by mouth every 8 (eight) hours as needed for nausea or vomiting. 12 tablet 0   OZEMPIC, 0.25 OR 0.5 MG/DOSE, 2 MG/3ML SOPN Inject 0.25 mg as directed once a week.     progesterone (PROMETRIUM) 200 MG capsule Take 200 mg by mouth at bedtime as needed (discomfort).     propranolol  ER (INDERAL  LA) 60 MG 24 hr capsule TAKE 1 CAPSULE BY MOUTH EVERY DAY 90 capsule 2   rosuvastatin  (CRESTOR ) 10 MG tablet Take 1 tablet (10 mg total) by mouth daily. 90 tablet 3   SPIRIVA RESPIMAT 2.5 MCG/ACT AERS Inhale 2 puffs into the lungs daily as needed (shortness of breath).     spironolactone  (ALDACTONE ) 25 MG tablet Take 0.5 tablets (12.5 mg total) by mouth daily. 45 tablet 3   sucralfate  (CARAFATE ) 1 g tablet Take 1 tablet (1 g total) by mouth 4 (four) times daily -  with meals and at bedtime. 120 tablet 5   tretinoin (RETIN-A) 0.05 % cream Apply 1 Application topically at bedtime.     vitamin C (ASCORBIC ACID) 500 MG tablet Take 500 mg by mouth daily.     No current facility-administered medications for this visit.    Allergies as of 06/20/2024 - Review Complete 05/27/2024  Allergen Reaction Noted   Sulfa antibiotics Hives 06/20/2013   Amoxicillin Nausea Only 01/29/2023   Codeine Nausea And Vomiting 06/20/2013    Family History  Adopted: Yes  Problem Relation Age of Onset   Breast cancer Mother 21   Cancer Father        bile duct cancer died in his 52's   Colon polyps Neg Hx    Colon cancer Neg Hx    Esophageal cancer Neg Hx    Stomach cancer Neg Hx    Rectal cancer Neg Hx     Social History    Socioeconomic History   Marital status: Married    Spouse name: Not on file   Number of children: 2   Years of education: Not on file   Highest education level: Not on file  Occupational History   Not on file  Tobacco Use   Smoking status: Former    Current packs/day: 1.50    Average packs/day: 1.5 packs/day for 30.0 years (45.0 ttl pk-yrs)    Types: Cigarettes   Smokeless tobacco: Never   Tobacco comments:  20 cigarettes per day 11/16/19 [facility]   Vaping Use   Vaping status: Former   Quit date: 10/08/2023  Substance and Sexual Activity   Alcohol use: Not Currently    Comment: social   Drug use: No   Sexual activity: Yes    Birth control/protection: Surgical  Other Topics Concern   Not on file  Social History Narrative   Divorced 1 son and 1 daughter   2-story home   Left handed   1 caffeinated beverage daily   She has been a librarian, 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   There is a history of vaping   No alcohol   No drug use   Social Drivers of Health   Tobacco Use: Medium Risk (05/27/2024)   Patient History    Smoking Tobacco Use: Former    Smokeless Tobacco Use: Never    Passive Exposure: Not on Actuary Strain: Not on file  Food Insecurity: Not on file  Transportation Needs: Not on file  Physical Activity: Not on file  Stress: Not on file  Social Connections: Not on file  Intimate Partner Violence: Not on file  Depression (PHQ2-9): Not on file  Alcohol Screen: Not on file  Housing: Not on file  Utilities: Not on file  Health Literacy: Not on file    Review of Systems:    Constitutional: No weight loss, fever, chills, weakness or fatigue HEENT: Eyes: No change in vision               Ears, Nose, Throat:  No change in hearing or congestion Skin: No rash or itching Cardiovascular: No chest pain, chest pressure or palpitations   Respiratory: No SOB or cough Gastrointestinal: See HPI and otherwise negative Genitourinary: No dysuria or change in  urinary frequency Neurological: No headache, dizziness or syncope Musculoskeletal: No new muscle or joint pain Hematologic: No bleeding or bruising Psychiatric: No history of depression or anxiety    Physical Exam:  Vital signs: There were no vitals taken for this visit.  Constitutional:   Pleasant Caucasian female appears to be in NAD, Well developed, Well nourished, alert and cooperative Head:  Normocephalic and atraumatic. Eyes:   PEERL, EOMI. No icterus. Conjunctiva pink. Ears:  Normal auditory acuity. Neck:  Supple Throat: Oral cavity and pharynx without inflammation, swelling or lesion.  Respiratory: Respirations even and unlabored. Lungs clear to auscultation bilaterally.   No wheezes, crackles, or rhonchi.  Cardiovascular: Normal S1, S2. No MRG. Regular rate and rhythm. No peripheral edema, cyanosis or pallor.  Gastrointestinal:  Soft, nondistended, nontender. No rebound or guarding. Normal bowel sounds. No appreciable masses or hepatomegaly. Rectal:  Not performed.  Msk:  Symmetrical without gross deformities. Without edema, no deformity or joint abnormality.  Neurologic:  Alert and  oriented x4;  grossly normal neurologically.  Skin:   Dry and intact without significant lesions or rashes. Psychiatric: Oriented to person, place and time. Demonstrates good judgement and reason without abnormal affect or behaviors.  RELEVANT LABS AND IMAGING: CBC    Component Value Date/Time   WBC 10.0 02/26/2024 1428   RBC 4.52 02/26/2024 1428   HGB 14.6 02/26/2024 1428   HGB 13.6 03/02/2023 1432   HCT 43.7 02/26/2024 1428   HCT 40.9 03/02/2023 1432   PLT 257 02/26/2024 1428   PLT 270 03/02/2023 1432   MCV 96.7 02/26/2024 1428   MCV 97 03/02/2023 1432   MCH 32.3 02/26/2024 1428   MCHC 33.4 02/26/2024 1428   RDW 12.6  02/26/2024 1428   RDW 12.3 03/02/2023 1432   LYMPHSABS 2.8 11/27/2023 1019   LYMPHSABS 3.7 (H) 10/10/2019 1600   MONOABS 0.6 11/27/2023 1019   EOSABS 0.1 11/27/2023  1019   EOSABS 0.1 10/10/2019 1600   BASOSABS 0.1 11/27/2023 1019   BASOSABS 0.1 10/10/2019 1600    CMP     Component Value Date/Time   NA 140 02/26/2024 1540   NA 139 02/18/2024 1638   K 3.9 02/26/2024 1540   CL 104 02/26/2024 1540   CO2 25 02/26/2024 1540   GLUCOSE 94 02/26/2024 1540   BUN 16 02/26/2024 1540   BUN 15 02/18/2024 1638   CREATININE 0.97 02/26/2024 1540   CALCIUM  9.3 02/26/2024 1540   PROT 6.7 02/26/2024 1540   PROT 6.7 08/21/2023 1554   ALBUMIN  4.2 02/26/2024 1540   ALBUMIN  4.4 08/21/2023 1554   AST 32 02/26/2024 1540   ALT 27 02/26/2024 1540   ALKPHOS 73 02/26/2024 1540   BILITOT 0.3 02/26/2024 1540   BILITOT 0.4 08/21/2023 1554   GFRNONAA >60 02/26/2024 1540   GFRAA 85 10/10/2019 1600    Assessment: 1.  GERD: EGD in May with gastritis and mild reactive gastropathy, continued symptoms of what sounded like bile reflux at office visit in November, was on Ozempic at that time likely contributing, status post cholecystectomy in August 2.  Constipation: Relieved with Linzess  145 mcg daily but also given watery stools 3.  Status postcholecystectomy 4.  Bloating: At last visit to consider relation to SIBO and previously tried treated with Xifaxan  but she could not take certain antibiotics given aneurysm  Plan: 1. ***     Delon Failing, PA-C [facility] 06/17/2024, 3:26 PM  Cc: Donal Channing SQUIBB, FNP  "

## 2024-06-20 ENCOUNTER — Ambulatory Visit: Admitting: Physician Assistant

## 2024-06-30 ENCOUNTER — Other Ambulatory Visit: Payer: Self-pay | Admitting: Medical Genetics

## 2024-06-30 DIAGNOSIS — Z006 Encounter for examination for normal comparison and control in clinical research program: Secondary | ICD-10-CM

## 2024-07-11 ENCOUNTER — Ambulatory Visit
Admission: RE | Admit: 2024-07-11 | Discharge: 2024-07-11 | Disposition: A | Discharge status: Home / Self Care | Source: Ambulatory Visit | Attending: Emergency Medicine | Admitting: Emergency Medicine

## 2024-07-11 ENCOUNTER — Inpatient Hospital Stay
Admission: RE | Admit: 2024-07-11 | Discharge: 2024-07-11 | Payer: Self-pay | Attending: Emergency Medicine | Admitting: Emergency Medicine

## 2024-07-11 ENCOUNTER — Ambulatory Visit: Payer: Self-pay | Admitting: Pulmonary Disease

## 2024-07-11 ENCOUNTER — Ambulatory Visit: Payer: Self-pay

## 2024-07-11 ENCOUNTER — Ambulatory Visit
Admission: RE | Admit: 2024-07-11 | Discharge: 2024-07-11 | Disposition: A | Discharge status: Home / Self Care | Source: Ambulatory Visit

## 2024-07-11 VITALS — BP 115/75 | HR 76 | Temp 98.7°F | Resp 17 | Wt 130.0 lb

## 2024-07-11 DIAGNOSIS — Z8709 Personal history of other diseases of the respiratory system: Secondary | ICD-10-CM | POA: Diagnosis not present

## 2024-07-11 DIAGNOSIS — R079 Chest pain, unspecified: Secondary | ICD-10-CM

## 2024-07-11 DIAGNOSIS — J209 Acute bronchitis, unspecified: Secondary | ICD-10-CM | POA: Diagnosis not present

## 2024-07-11 LAB — POCT URINE DIPSTICK
Bilirubin, UA: NEGATIVE
Blood, UA: NEGATIVE
Glucose, UA: NEGATIVE mg/dL
Ketones, POC UA: NEGATIVE mg/dL
Leukocytes, UA: NEGATIVE
Nitrite, UA: NEGATIVE
Protein Ur, POC: NEGATIVE mg/dL
Spec Grav, UA: <=1.005 — AB
Urobilinogen, UA: 0.2 U/dL
pH, UA: 6

## 2024-07-11 LAB — POC COVID19/FLU A&B COMBO
Covid Antigen, POC: NEGATIVE
Influenza A Antigen, POC: NEGATIVE
Influenza B Antigen, POC: NEGATIVE

## 2024-07-11 LAB — POCT RAPID STREP A (OFFICE): Rapid Strep A Screen: NEGATIVE

## 2024-07-11 MED ORDER — FLUCONAZOLE 150 MG PO TABS: ORAL_TABLET | ORAL | 1 refills | Status: AC

## 2024-07-11 MED ORDER — GUAIFENESIN ER 600 MG PO TB12: 600.0000 mg | ORAL_TABLET | Freq: Two times a day (BID) | ORAL | 0 refills | Status: AC

## 2024-07-11 MED ORDER — BENZONATATE 100 MG PO CAPS: 100.0000 mg | ORAL_CAPSULE | Freq: Three times a day (TID) | ORAL | 0 refills | Status: AC

## 2024-07-11 MED ORDER — PREDNISONE 50 MG PO TABS: ORAL_TABLET | ORAL | 0 refills | Status: AC

## 2024-07-11 NOTE — Telephone Encounter (Signed)
 FYI Only or Action Required?: FYI only for provider: UC at 230pm .  Patient is followed in Pulmonology for interstitial lung disease, last seen on 12/01/2023 by Mannam, Praveen, MD.  Called Nurse Triage reporting Cough.  Symptoms began several days ago.  Interventions attempted: OTC medications: Nyquil.  Symptoms are: gradually worsening.  Triage Disposition: See Physician Within 24 Hours  Patient/caregiver understands and will follow disposition?: Yes  E2C2 Pulmonary Triage - Initial Assessment Questions Chief Complaint (e.g., cough, sob, wheezing, fever, chills, sweat or additional symptoms) *Go to specific symptom protocol after initial questions. Cough- chest tightness  How long have symptoms been present? 3 days   Have you tested for COVID or Flu? Note: If not, ask patient if a home test can be taken. If so, instruct patient to call back for positive results. No  MEDICINES:   Have you used any OTC meds to help with symptoms? Yes If yes, ask What medications? Nyquil   Have you used your inhalers/maintenance medication? Yes If yes, What medications? Will try her inhalers- Spiriva and Ventolin    If inhaler, ask How many puffs and how often? Note: Review instructions on medication in the chart. na  OXYGEN: Do you wear supplemental oxygen? No If yes, How many liters are you supposed to use? na  Do you monitor your oxygen levels? No If yes, What is your reading (oxygen level) today? na  What is your usual oxygen saturation reading?  (Note: Pulmonary O2 sats should be 90% or greater) na   Message from LaVerne A sent at 07/11/2024  8:39 AM EST  Reason for Triage: Patient thinks she has RSV.  She has interstitial lung disease, cough with pain in her lungs producing green mucus, low-grade fever of 99 this morning, mild sore throat and headache.  Patient Dr. Theophilus   Reason for Disposition  [1] Fever returns after gone for over 24 hours AND [2]  symptoms worse or not improved  Answer Assessment - Initial Assessment Questions Pt with URI for 3 days. Low grade fever, headache, sore throat (resolved), cough with chest congestion/tightness, SOB with exertion. Extremely painful to cough Lungs hurt- feels like something is sitting on her chest  Nyquil- helps her sleep  Has inhalers but hasn't tried since they did not help with her ILD.   Patient has UC appt at 230pm today. Will go to the ED chest congestion worsens.   1. ONSET: When did the nasal discharge start?      3 days  2. AMOUNT: How much discharge is there?      Moderate- green thick  3. COUGH: Do you have a cough? If Yes, ask: Describe the color of your mucus. (e.g., clear, white, yellow, green)     Mild- moderate cough- green  4. RESPIRATORY DISTRESS: Describe your breathing.      Mild SOB with activity 5. FEVER: Do you have a fever? If Yes, ask: What is your temperature, how was it measured, and when did it start?     99 6. SEVERITY: Overall, how bad are you feeling right now? (e.g., doesn't interfere with normal activities, staying home from school/work, staying in bed)      Laying in bed for 3 days.  7. OTHER SYMPTOMS: Do you have any other symptoms? (e.g., earache, mouth sores, sore throat, wheezing)     Sore throat, headaches  Protocols used: Common Cold-A-AH

## 2024-07-11 NOTE — Discharge Instructions (Addendum)
 Will treat for bronchitis which is viral and antibiotics are not needed.  You are neg for covid and flu, urine looks normal with no signs of bacteria.  Will send for chest xray for peace of mind, we will call you with results and if any other meds need to be prescribed.

## 2024-07-12 ENCOUNTER — Ambulatory Visit: Payer: Self-pay | Admitting: Pulmonary Disease

## 2024-07-12 ENCOUNTER — Ambulatory Visit: Admitting: Physician Assistant

## 2024-07-12 ENCOUNTER — Encounter: Payer: Self-pay | Admitting: Physician Assistant

## 2024-07-12 VITALS — BP 112/74 | HR 87 | Ht 64.0 in | Wt 138.0 lb

## 2024-07-12 DIAGNOSIS — K219 Gastro-esophageal reflux disease without esophagitis: Secondary | ICD-10-CM

## 2024-07-12 DIAGNOSIS — K581 Irritable bowel syndrome with constipation: Secondary | ICD-10-CM

## 2024-07-12 DIAGNOSIS — R1013 Epigastric pain: Secondary | ICD-10-CM

## 2024-07-12 LAB — CERVICOVAGINAL ANCILLARY ONLY
Bacterial Vaginitis (gardnerella): NEGATIVE
Candida Glabrata: POSITIVE — AB
Candida Vaginitis: NEGATIVE
Chlamydia: NEGATIVE
Comment: NEGATIVE
Comment: NEGATIVE
Comment: NEGATIVE
Comment: NEGATIVE
Comment: NEGATIVE
Comment: NORMAL
Neisseria Gonorrhea: NEGATIVE
Trichomonas: NEGATIVE

## 2024-07-12 MED ORDER — AZITHROMYCIN 250 MG PO TABS: 250.0000 mg | ORAL_TABLET | Freq: Every day | ORAL | 0 refills | Status: AC

## 2024-07-12 NOTE — Patient Instructions (Signed)
 We have sent the following medications to your pharmacy for you to pick up at your convenience: Omeprazole  & Carafate .    You have been scheduled for a gastric emptying scan at [facility] on 07/22/24 at 8 am. Please arrive at least 30 minutes prior to your appointment for registration. Please make certain not to have anything to eat or drink after midnight the night before your test. Hold all stomach medications (ex: Zofran , phenergan , Reglan) 24 hours prior to your test. If you need to reschedule your appointment, please contact radiology scheduling at [phone]. _____________________________________________________________________ A gastric-emptying study measures how long it takes for food to move through your stomach. There are several ways to measure stomach emptying. In the most common test, you eat food that contains a small amount of radioactive material. A scanner that detects the movement of the radioactive material is placed over your abdomen to monitor the rate at which food leaves your stomach. This test normally takes about 4 hours to complete. _____________________________________________________________________

## 2024-07-12 NOTE — Telephone Encounter (Signed)
 Pt was called- She verbalized understanding  and no concern.

## 2024-07-12 NOTE — Telephone Encounter (Signed)
 I reviewed the x-ray which does not show pneumonia but she could be having bronchitis with low-grade fevers and green sputum. I have called in a prescription for Z-Pak to her pharmacy.

## 2024-07-12 NOTE — Telephone Encounter (Signed)
 FYI Only or Action Required?: Action required by provider: clinical question for provider and update on patient condition.  Patient is followed in Pulmonology for ILD, last seen on 12/01/2023 by Mannam, Praveen, MD.  Called Nurse Triage reporting Advice Only.  Symptoms began N/A.  Interventions attempted: Other: N/A.  Symptoms are: N/A.  Triage Disposition: Call PCP When Office is Open  Patient/caregiver understands and will follow disposition?: Yes                                  Patient went to UC yesterday for evaluation. Chest x-ray did not show signs of pneumonia. Patient tested negative for COVID, flu and RSV. Patient was diagnosed with viral pneumonia and prescribed prednisone  and Tessalon . Patient denied new/worsening symptoms. Patient expressed concern that she was not prescribed an antibiotic yesterday. Patient would like to request provider to send in an antibiotic. Please advise. Patient would appreciate a call back.   Copied from CRM #[id number]. Topic: Clinical - Medical Advice >> Jul 12, 2024  8:17 AM LaVerne A wrote: Reason for CRM: Patient was triaged by a nurse yesterday and went to [facility] urgent care.  She has interstitial lung disease and a patient of Dr. Theophilus.  They sent her to the hospital to get an x-ray.  She was given a steroid and something for a cough but she thinks she needs an antibiotic.  Please return her call at [phone].  Thanks.  Reason for Disposition  [1] Caller requesting NON-URGENT health information AND [2] PCP's office is the best resource  Protocols used: Information Only Call - No Triage-A-AH

## 2024-07-22 ENCOUNTER — Other Ambulatory Visit (HOSPITAL_COMMUNITY)

## 2024-09-06 ENCOUNTER — Ambulatory Visit: Admitting: "Endocrinology

## 2024-10-18 ENCOUNTER — Ambulatory Visit: Admitting: Physician Assistant
